# Patient Record
Sex: Male | Born: 1955 | Race: White | Hispanic: No | Marital: Married | Smoking: Former smoker
Health system: Southern US, Community
[De-identification: ages and names within clinical notes are randomized; demographics above are authoritative.]

## PROBLEM LIST (undated history)

## (undated) DIAGNOSIS — K219 Gastro-esophageal reflux disease without esophagitis: Secondary | ICD-10-CM

## (undated) DIAGNOSIS — Z86010 Personal history of colonic polyps: Secondary | ICD-10-CM

## (undated) DIAGNOSIS — I700 Atherosclerosis of aorta: Secondary | ICD-10-CM

## (undated) DIAGNOSIS — D649 Anemia, unspecified: Secondary | ICD-10-CM

## (undated) DIAGNOSIS — K4090 Unilateral inguinal hernia, without obstruction or gangrene, not specified as recurrent: Secondary | ICD-10-CM

## (undated) DIAGNOSIS — K2270 Barrett's esophagus without dysplasia: Secondary | ICD-10-CM

## (undated) DIAGNOSIS — M545 Low back pain, unspecified: Secondary | ICD-10-CM

## (undated) DIAGNOSIS — C801 Malignant (primary) neoplasm, unspecified: Secondary | ICD-10-CM

## (undated) DIAGNOSIS — K449 Diaphragmatic hernia without obstruction or gangrene: Secondary | ICD-10-CM

## (undated) DIAGNOSIS — K529 Noninfective gastroenteritis and colitis, unspecified: Secondary | ICD-10-CM

## (undated) DIAGNOSIS — D369 Benign neoplasm, unspecified site: Secondary | ICD-10-CM

## (undated) DIAGNOSIS — E785 Hyperlipidemia, unspecified: Secondary | ICD-10-CM

## (undated) DIAGNOSIS — IMO0002 Reserved for concepts with insufficient information to code with codable children: Secondary | ICD-10-CM

## (undated) DIAGNOSIS — K5790 Diverticulosis of intestine, part unspecified, without perforation or abscess without bleeding: Secondary | ICD-10-CM

## (undated) DIAGNOSIS — K648 Other hemorrhoids: Secondary | ICD-10-CM

## (undated) HISTORY — DX: Gastro-esophageal reflux disease without esophagitis: K21.9

## (undated) HISTORY — DX: Benign neoplasm, unspecified site: D36.9

## (undated) HISTORY — DX: Barrett's esophagus without dysplasia: K22.70

## (undated) HISTORY — DX: Atherosclerosis of aorta: I70.0

## (undated) HISTORY — DX: Low back pain, unspecified: M54.50

## (undated) HISTORY — DX: Diverticulosis of intestine, part unspecified, without perforation or abscess without bleeding: K57.90

## (undated) HISTORY — DX: Low back pain: M54.5

## (undated) HISTORY — DX: Reserved for concepts with insufficient information to code with codable children: IMO0002

## (undated) HISTORY — PX: COLONOSCOPY: SHX174

## (undated) HISTORY — DX: Noninfective gastroenteritis and colitis, unspecified: K52.9

## (undated) HISTORY — DX: Personal history of colonic polyps: Z86.010

## (undated) HISTORY — DX: Hyperlipidemia, unspecified: E78.5

## (undated) HISTORY — DX: Unilateral inguinal hernia, without obstruction or gangrene, not specified as recurrent: K40.90

## (undated) HISTORY — DX: Other hemorrhoids: K64.8

## (undated) HISTORY — DX: Anemia, unspecified: D64.9

## (undated) HISTORY — DX: Diaphragmatic hernia without obstruction or gangrene: K44.9

## (undated) HISTORY — PX: WISDOM TOOTH EXTRACTION: SHX21

---

## 1978-01-17 HISTORY — PX: HERNIA REPAIR: SHX51

## 2007-12-18 ENCOUNTER — Ambulatory Visit: Payer: Self-pay | Admitting: Internal Medicine

## 2008-01-25 ENCOUNTER — Ambulatory Visit: Payer: Self-pay | Admitting: Internal Medicine

## 2008-02-05 ENCOUNTER — Ambulatory Visit: Payer: Self-pay | Admitting: Internal Medicine

## 2008-05-08 ENCOUNTER — Ambulatory Visit: Payer: Self-pay | Admitting: Internal Medicine

## 2009-04-23 ENCOUNTER — Ambulatory Visit: Payer: Self-pay | Admitting: Internal Medicine

## 2009-06-30 ENCOUNTER — Ambulatory Visit: Payer: Self-pay | Admitting: Internal Medicine

## 2009-10-20 ENCOUNTER — Ambulatory Visit: Payer: Self-pay | Admitting: Internal Medicine

## 2010-03-11 DIAGNOSIS — J069 Acute upper respiratory infection, unspecified: Secondary | ICD-10-CM

## 2010-10-28 ENCOUNTER — Encounter: Payer: Self-pay | Admitting: Internal Medicine

## 2010-10-28 ENCOUNTER — Ambulatory Visit (INDEPENDENT_AMBULATORY_CARE_PROVIDER_SITE_OTHER): Payer: 59 | Admitting: Internal Medicine

## 2010-10-28 DIAGNOSIS — J069 Acute upper respiratory infection, unspecified: Secondary | ICD-10-CM

## 2010-10-28 DIAGNOSIS — Z87891 Personal history of nicotine dependence: Secondary | ICD-10-CM

## 2010-10-28 DIAGNOSIS — E785 Hyperlipidemia, unspecified: Secondary | ICD-10-CM

## 2010-10-28 DIAGNOSIS — M545 Low back pain, unspecified: Secondary | ICD-10-CM

## 2010-10-28 DIAGNOSIS — K219 Gastro-esophageal reflux disease without esophagitis: Secondary | ICD-10-CM

## 2010-11-14 ENCOUNTER — Encounter: Payer: Self-pay | Admitting: Internal Medicine

## 2010-11-14 DIAGNOSIS — Z87891 Personal history of nicotine dependence: Secondary | ICD-10-CM | POA: Insufficient documentation

## 2010-11-14 DIAGNOSIS — E785 Hyperlipidemia, unspecified: Secondary | ICD-10-CM | POA: Insufficient documentation

## 2010-11-14 DIAGNOSIS — M545 Low back pain, unspecified: Secondary | ICD-10-CM | POA: Insufficient documentation

## 2010-11-14 DIAGNOSIS — K219 Gastro-esophageal reflux disease without esophagitis: Secondary | ICD-10-CM | POA: Insufficient documentation

## 2010-11-14 NOTE — Patient Instructions (Signed)
Take Biaxin twice daily for 10 days with food as prescribed. Use Hycodan cough syrup as needed every 6 hours for cough. Call if not better in 10 days to 2 weeks or sooner if worse. Schedule physical exam in the near future.

## 2010-11-14 NOTE — Progress Notes (Signed)
  Subjective:    Patient ID: Sheral Flow, male    DOB: [birth year], 55 y.o.   MRN: [id number]  HPI 55 year old white male with history of hyperlipidemia, cigarette smoking, GE reflux, low back pain in today with URI symptoms. Discolored sputum production, malaise and fatigue. Smokes one half pack cigarettes per day and is not ready to quit.    Review of Systems     Objective:   Physical Exam HEENT exam: Slightly injected pharynx without exudate; TMs clear; neck supple without significant adenopathy; chest clear to auscultation        Assessment & Plan:  URI  Plan: Biaxin 500 mg by mouth twice daily for 10 days. Take with food. Hycodan cough syrup 8 ounces 1 teaspoon by mouth every 6 hours as needed for cough.

## 2011-01-18 DIAGNOSIS — Z860100 Personal history of colon polyps, unspecified: Secondary | ICD-10-CM

## 2011-01-18 DIAGNOSIS — K648 Other hemorrhoids: Secondary | ICD-10-CM

## 2011-01-18 DIAGNOSIS — K5790 Diverticulosis of intestine, part unspecified, without perforation or abscess without bleeding: Secondary | ICD-10-CM

## 2011-01-18 HISTORY — DX: Diverticulosis of intestine, part unspecified, without perforation or abscess without bleeding: K57.90

## 2011-01-18 HISTORY — DX: Personal history of colonic polyps: Z86.010

## 2011-01-18 HISTORY — DX: Other hemorrhoids: K64.8

## 2011-01-18 HISTORY — DX: Personal history of colon polyps, unspecified: Z86.0100

## 2011-02-22 ENCOUNTER — Other Ambulatory Visit: Payer: Self-pay

## 2011-02-22 MED ORDER — PANTOPRAZOLE SODIUM 40 MG PO TBEC: 40.0000 mg | DELAYED_RELEASE_TABLET | Freq: Every day | ORAL | Status: DC

## 2011-03-10 ENCOUNTER — Other Ambulatory Visit: Payer: 59 | Admitting: Internal Medicine

## 2011-03-11 ENCOUNTER — Encounter: Payer: 59 | Admitting: Internal Medicine

## 2011-04-22 ENCOUNTER — Other Ambulatory Visit: Payer: 59 | Admitting: Internal Medicine

## 2011-04-22 DIAGNOSIS — Z0000 Encounter for general adult medical examination without abnormal findings: Secondary | ICD-10-CM

## 2011-04-22 DIAGNOSIS — Z125 Encounter for screening for malignant neoplasm of prostate: Secondary | ICD-10-CM

## 2011-04-22 LAB — COMPREHENSIVE METABOLIC PANEL
ALT: 15 U/L (ref 0–53)
AST: 20 U/L (ref 0–37)
Albumin: 4.3 g/dL (ref 3.5–5.2)
Alkaline Phosphatase: 56 U/L (ref 39–117)
BUN: 12 mg/dL (ref 6–23)
CO2: 21 mEq/L (ref 19–32)
Calcium: 9.3 mg/dL (ref 8.4–10.5)
Chloride: 108 mEq/L (ref 96–112)
Creat: 0.88 mg/dL (ref 0.50–1.35)
Glucose, Bld: 94 mg/dL (ref 70–99)
Potassium: 4.4 mEq/L (ref 3.5–5.3)
Sodium: 140 mEq/L (ref 135–145)
Total Bilirubin: 0.4 mg/dL (ref 0.3–1.2)
Total Protein: 6.8 g/dL (ref 6.0–8.3)

## 2011-04-22 LAB — CBC WITH DIFFERENTIAL/PLATELET
Basophils Absolute: 0 10*3/uL (ref 0.0–0.1)
Basophils Relative: 0 % (ref 0–1)
Eosinophils Absolute: 0.2 10*3/uL (ref 0.0–0.7)
Eosinophils Relative: 3 % (ref 0–5)
HCT: 42.6 % (ref 39.0–52.0)
Hemoglobin: 13.8 g/dL (ref 13.0–17.0)
Lymphocytes Relative: 32 % (ref 12–46)
Lymphs Abs: 2.2 10*3/uL (ref 0.7–4.0)
MCH: 31.4 pg (ref 26.0–34.0)
MCHC: 32.4 g/dL (ref 30.0–36.0)
MCV: 97 fL (ref 78.0–100.0)
Monocytes Absolute: 0.6 10*3/uL (ref 0.1–1.0)
Monocytes Relative: 9 % (ref 3–12)
Neutro Abs: 3.8 10*3/uL (ref 1.7–7.7)
Neutrophils Relative %: 56 % (ref 43–77)
Platelets: 301 10*3/uL (ref 150–400)
RBC: 4.39 MIL/uL (ref 4.22–5.81)
RDW: 13.2 % (ref 11.5–15.5)
WBC: 6.8 10*3/uL (ref 4.0–10.5)

## 2011-04-22 LAB — LIPID PANEL
Cholesterol: 224 mg/dL — ABNORMAL HIGH (ref 0–200)
HDL: 66 mg/dL (ref >39)
LDL Cholesterol: 143 mg/dL — ABNORMAL HIGH (ref 0–99)
Total CHOL/HDL Ratio: 3.4 Ratio
Triglycerides: 74 mg/dL (ref <150)
VLDL: 15 mg/dL (ref 0–40)

## 2011-04-22 LAB — PSA: PSA: 1.01 ng/mL (ref <=4.00)

## 2011-04-25 ENCOUNTER — Encounter: Payer: 59 | Admitting: Internal Medicine

## 2011-05-12 ENCOUNTER — Other Ambulatory Visit: Payer: 59 | Admitting: Internal Medicine

## 2011-05-12 DIAGNOSIS — Z79899 Other long term (current) drug therapy: Secondary | ICD-10-CM

## 2011-05-12 DIAGNOSIS — E785 Hyperlipidemia, unspecified: Secondary | ICD-10-CM

## 2011-05-12 DIAGNOSIS — Z125 Encounter for screening for malignant neoplasm of prostate: Secondary | ICD-10-CM

## 2011-05-12 LAB — LIPID PANEL
Cholesterol: 252 mg/dL — ABNORMAL HIGH (ref 0–200)
HDL: 78 mg/dL (ref >39)
LDL Cholesterol: 155 mg/dL — ABNORMAL HIGH (ref 0–99)
Total CHOL/HDL Ratio: 3.2 Ratio
Triglycerides: 93 mg/dL (ref <150)
VLDL: 19 mg/dL (ref 0–40)

## 2011-05-12 LAB — CBC WITH DIFFERENTIAL/PLATELET
Basophils Absolute: 0 10*3/uL (ref 0.0–0.1)
Basophils Relative: 0 % (ref 0–1)
Eosinophils Absolute: 0.2 10*3/uL (ref 0.0–0.7)
Eosinophils Relative: 2 % (ref 0–5)
HCT: 44.3 % (ref 39.0–52.0)
Hemoglobin: 14.7 g/dL (ref 13.0–17.0)
Lymphocytes Relative: 27 % (ref 12–46)
Lymphs Abs: 2.4 10*3/uL (ref 0.7–4.0)
MCH: 32.2 pg (ref 26.0–34.0)
MCHC: 33.2 g/dL (ref 30.0–36.0)
MCV: 96.9 fL (ref 78.0–100.0)
Monocytes Absolute: 0.8 10*3/uL (ref 0.1–1.0)
Monocytes Relative: 9 % (ref 3–12)
Neutro Abs: 5.4 10*3/uL (ref 1.7–7.7)
Neutrophils Relative %: 62 % (ref 43–77)
Platelets: 320 10*3/uL (ref 150–400)
RBC: 4.57 MIL/uL (ref 4.22–5.81)
RDW: 12.7 % (ref 11.5–15.5)
WBC: 8.7 10*3/uL (ref 4.0–10.5)

## 2011-05-12 LAB — COMPREHENSIVE METABOLIC PANEL
ALT: 13 U/L (ref 0–53)
AST: 19 U/L (ref 0–37)
Albumin: 4.6 g/dL (ref 3.5–5.2)
Alkaline Phosphatase: 59 U/L (ref 39–117)
BUN: 12 mg/dL (ref 6–23)
CO2: 25 mEq/L (ref 19–32)
Calcium: 9.5 mg/dL (ref 8.4–10.5)
Chloride: 104 mEq/L (ref 96–112)
Creat: 0.86 mg/dL (ref 0.50–1.35)
Glucose, Bld: 99 mg/dL (ref 70–99)
Potassium: 4.5 mEq/L (ref 3.5–5.3)
Sodium: 139 mEq/L (ref 135–145)
Total Bilirubin: 0.5 mg/dL (ref 0.3–1.2)
Total Protein: 7.1 g/dL (ref 6.0–8.3)

## 2011-05-12 LAB — PSA: PSA: 1.11 ng/mL (ref <=4.00)

## 2011-05-13 ENCOUNTER — Encounter: Payer: Self-pay | Admitting: Internal Medicine

## 2011-05-13 ENCOUNTER — Ambulatory Visit (INDEPENDENT_AMBULATORY_CARE_PROVIDER_SITE_OTHER): Payer: 59 | Admitting: Internal Medicine

## 2011-05-13 VITALS — BP 136/80 | HR 80 | Resp 12 | Wt 213.0 lb

## 2011-05-13 DIAGNOSIS — Z0000 Encounter for general adult medical examination without abnormal findings: Secondary | ICD-10-CM

## 2011-05-13 DIAGNOSIS — Z23 Encounter for immunization: Secondary | ICD-10-CM

## 2011-05-13 DIAGNOSIS — E785 Hyperlipidemia, unspecified: Secondary | ICD-10-CM

## 2011-05-13 DIAGNOSIS — Z8739 Personal history of other diseases of the musculoskeletal system and connective tissue: Secondary | ICD-10-CM

## 2011-05-13 LAB — POCT URINALYSIS DIPSTICK
Bilirubin, UA: NEGATIVE
Glucose, UA: NEGATIVE
Ketones, UA: NEGATIVE
Leukocytes, UA: NEGATIVE
Nitrite, UA: NEGATIVE
Protein, UA: NEGATIVE
Spec Grav, UA: <=1.005
Urobilinogen, UA: NEGATIVE
pH, UA: 6

## 2011-05-13 NOTE — Progress Notes (Signed)
  Subjective:    Patient ID: James [name], male    DOB: [birth year], 56 y.o.   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 History of GE reflux. History of hyperlipidemia. History of cigarette smoking. Is never had screening colonoscopy but agrees to do this in the near future. Recent lipid panel shows a total cholesterol of 252 with an LDL cholesterol of 155.  No known drug allergies  History of degenerative disc disease in his back with episodic low back pain.  Fractured left forearm 1962, fractured right ankle 1971, left inguinal herniorrhaphy 1995. Tetanus immunization update given today.  Social history married completed 2 years of college. Has smoked a pack of cigarettes daily for well over 25 years.  Family history: Father died at age 94 with history of coronary artery disease esophageal and colon cancer. Mother died from complications of a stroke. 2 sisters in good health.    Review of Systems  Constitutional: Negative.   HENT:       History of allergic rhinitis  Eyes: Negative.   Respiratory: Negative.   Cardiovascular: Negative.   Gastrointestinal: Negative.   Genitourinary: Negative.   Musculoskeletal:       History of intermittent low back pain  Neurological: Negative.   Hematological: Negative.   Psychiatric/Behavioral: Negative.        Objective:   Physical Exam  Vitals reviewed. Constitutional: He is oriented to person, place, and time. He appears well-developed and well-nourished. No distress.  HENT:  Head: Normocephalic and atraumatic.  Right Ear: External ear normal.  Left Ear: External ear normal.  Mouth/Throat: Oropharynx is clear and moist.  Eyes: Conjunctivae and EOM are normal. Pupils are equal, round, and reactive to light. Right eye exhibits no discharge. Left eye exhibits no discharge. No scleral icterus.  Neck: Neck supple. No JVD present. No  thyromegaly present.  Cardiovascular: Normal rate, regular rhythm, normal heart sounds and intact distal pulses.   No murmur heard. Pulmonary/Chest: Effort normal and breath sounds normal. No respiratory distress. He has no wheezes. He has no rales.  Abdominal: Soft. He exhibits no distension and no mass. There is no tenderness. There is no rebound and no guarding.  Genitourinary: Prostate normal.  Musculoskeletal: He exhibits no edema.  Lymphadenopathy:    He has no cervical adenopathy.  Neurological: He is alert and oriented to person, place, and time. He has normal reflexes. No cranial nerve deficit. Coordination normal.  Skin: Skin is warm and dry. No rash noted. He is not diaphoretic.  Psychiatric: He has a normal mood and affect. His behavior is normal. Judgment and thought content normal.          Assessment & Plan:  History of smoking  GE reflux  Family history of colon cancer in his father  Low back pain  Hyperlipidemia  Plan: Convince patient to start Zocor 10 mg daily. He will return in 3 months for fasting lipid panel liver functions and office visit. Tetanus immunization update given today. Colonoscopy scheduled for May 30 with Dr. Hilarie Fredrickson.

## 2011-05-13 NOTE — Patient Instructions (Signed)
Start Zocor 10 mg daily at supper and return in 3 months for fasting lipid panel liver functions and office visit. Try the diet exercise and lose some weight. We have scheduled colonoscopy appointment for you. You have been given a tetanus immunization update today which is good for 10 years.

## 2011-05-20 ENCOUNTER — Encounter: Payer: Self-pay | Admitting: Internal Medicine

## 2011-06-02 ENCOUNTER — Encounter: Payer: Self-pay | Admitting: Internal Medicine

## 2011-06-02 ENCOUNTER — Ambulatory Visit (AMBULATORY_SURGERY_CENTER): Payer: 59 | Admitting: *Deleted

## 2011-06-02 VITALS — Ht 72.0 in | Wt 215.7 lb

## 2011-06-02 DIAGNOSIS — Z1211 Encounter for screening for malignant neoplasm of colon: Secondary | ICD-10-CM

## 2011-06-02 MED ORDER — NA SULFATE-K SULFATE-MG SULF 17.5-3.13-1.6 GM/177ML PO SOLN: ORAL | Status: DC

## 2011-06-16 ENCOUNTER — Ambulatory Visit (AMBULATORY_SURGERY_CENTER): Payer: 59 | Admitting: Internal Medicine

## 2011-06-16 ENCOUNTER — Encounter: Payer: Self-pay | Admitting: Internal Medicine

## 2011-06-16 VITALS — BP 138/91 | HR 75 | Temp 96.9°F | Resp 12 | Ht 72.0 in | Wt 215.0 lb

## 2011-06-16 DIAGNOSIS — D126 Benign neoplasm of colon, unspecified: Secondary | ICD-10-CM

## 2011-06-16 DIAGNOSIS — Z1211 Encounter for screening for malignant neoplasm of colon: Secondary | ICD-10-CM

## 2011-06-16 DIAGNOSIS — K529 Noninfective gastroenteritis and colitis, unspecified: Secondary | ICD-10-CM

## 2011-06-16 DIAGNOSIS — D214 Benign neoplasm of connective and other soft tissue of abdomen: Secondary | ICD-10-CM

## 2011-06-16 DIAGNOSIS — K5289 Other specified noninfective gastroenteritis and colitis: Secondary | ICD-10-CM

## 2011-06-16 MED ORDER — HYDROCORTISONE ACE-PRAMOXINE 1-1 % RE FOAM: 1.0000 | Freq: Two times a day (BID) | RECTAL | Status: DC

## 2011-06-16 MED ORDER — SODIUM CHLORIDE 0.9 % IV SOLN: 500.0000 mL | INTRAVENOUS | Status: DC

## 2011-06-16 NOTE — Patient Instructions (Signed)
Discharge instructions given with verbal understanding. Handouts on polyps,diverticulosis and hemorrhoids given. Resume previous medications. Avoid all NSAIDS. YOU HAD AN ENDOSCOPIC PROCEDURE TODAY AT [facility]: Refer to the procedure report that was given to you for any specific questions about what was found during the examination.  If the procedure report does not answer your questions, please call your gastroenterologist to clarify.  If you requested that your care partner not be given the details of your procedure findings, then the procedure report has been included in a sealed envelope for you to review at your convenience later.  YOU SHOULD EXPECT: Some feelings of bloating in the abdomen. Passage of more gas than usual.  Walking can help get rid of the air that was put into your GI tract during the procedure and reduce the bloating. If you had a lower endoscopy (such as a colonoscopy or flexible sigmoidoscopy) you may notice spotting of blood in your stool or on the toilet paper. If you underwent a bowel prep for your procedure, then you may not have a normal bowel movement for a few days.  DIET: Your first meal following the procedure should be a light meal and then it is ok to progress to your normal diet.  A half-sandwich or bowl of soup is an example of a good first meal.  Heavy or fried foods are harder to digest and may make you feel nauseous or bloated.  Likewise meals heavy in dairy and vegetables can cause extra gas to form and this can also increase the bloating.  Drink plenty of fluids but you should avoid alcoholic beverages for 24 hours.  ACTIVITY: Your care partner should take you home directly after the procedure.  You should plan to take it easy, moving slowly for the rest of the day.  You can resume normal activity the day after the procedure however you should NOT DRIVE or use heavy machinery for 24 hours (because of the sedation medicines used during the  test).    SYMPTOMS TO REPORT IMMEDIATELY: A gastroenterologist can be reached at any hour.  During normal business hours, 8:30 AM to 5:00 PM Monday through Friday, call [phone].  After hours and on weekends, please call the GI answering service at [phone] who will take a message and have the physician on call contact you.   Following lower endoscopy (colonoscopy or flexible sigmoidoscopy):  Excessive amounts of blood in the stool  Significant tenderness or worsening of abdominal pains  Swelling of the abdomen that is new, acute  Fever of 100F or higher  FOLLOW UP: If any biopsies were taken you will be contacted by phone or by letter within the next 1-3 weeks.  Call your gastroenterologist if you have not heard about the biopsies in 3 weeks.  Our staff will call the home number listed on your records the next business day following your procedure to check on you and address any questions or concerns that you may have at that time regarding the information given to you following your procedure. This is a courtesy call and so if there is no answer at the home number and we have not heard from you through the emergency physician on call, we will assume that you have returned to your regular daily activities without incident.  SIGNATURES/CONFIDENTIALITY: You and/or your care partner have signed paperwork which will be entered into your electronic medical record.  These signatures attest to the fact that that the information above on  your After Visit Summary has been reviewed and is understood.  Full responsibility of the confidentiality of this discharge information lies with you and/or your care-partner.

## 2011-06-16 NOTE — Progress Notes (Signed)
The pt tolerated the colonoscopy very well. Maw   

## 2011-06-16 NOTE — Op Note (Signed)
[facility] [address]. [place], [place]  [address]  COLONOSCOPY PROCEDURE REPORT  PATIENT:  James, [name]  MR#:  [id number] BIRTHDATE:  07-16-1955, 56 yrs. old  GENDER:  male ENDOSCOPIST:  Lajuan Lines. Cortlandt Capuano, MD REF. BY:  Emeline General, M.D. PROCEDURE DATE:  06/16/2011 PROCEDURE:  Colonoscopy with multiple cold biopsies, Colonoscopy with snare polypectomy ASA CLASS:  Class II INDICATIONS:  Routine Risk Screening, 1st colonoscopy, intermittent BRBPR MEDICATIONS:   MAC sedation, administered by CRNA, propofol (Diprivan) 600 mg IV  DESCRIPTION OF PROCEDURE:   After the risks benefits and alternatives of the procedure were thoroughly explained, informed consent was obtained.  Digital rectal exam was performed and revealed no rectal masses.   The LB CF-[id number] endoscope was introduced through the anus and advanced to the cecum, which was identified by both the appendix and ileocecal valve, without limitations.  The quality of the prep was Suprep good.  The instrument was then slowly withdrawn as the colon was fully examined.<<PROCEDUREIMAGES>>  FINDINGS:  A 7 mm sessile polyp and firm was found in the cecum. Polyp was snared, then cauterized with monopolar cautery. Retrieval was successful. Two sessile polyps, 4 -5 mm, were found in the transverse colon. Polyps were snared without cautery. Retrieval was successful.  Segmental colitis was found in the sigmoid colon characterized by erythema, loss of normal vascular pattern and scant blood in the left colon. Multiple biopsies were obtained and sent to pathology.  A 2 cm pedunculated polyp was found in the sigmoid colon arising in the segment of colitis. Polyp was snared, then cauterized with monopolar cautery. Retrieval was successful.  Tattoo placed at this location with Niger ink.  There were 6 polyps identified and removed in the recto-sigmoid colon. Three Polyps were snared, then cauterized with monopolar  cautery. Retrieval was successful. Three polyps were removed using cold biopsy forceps.   Sigmoid diverticulosis. Retroflexed views in the rectum revealed internal hemorrhoids. The scope was then withdrawn from the cecum and the procedure completed.  COMPLICATIONS:  None  ENDOSCOPIC IMPRESSION: 1) Sessile polyp in the cecum. Removed and sent to pathology. 2) Two polyps in the transverse colon.  Removed and sent to pathology. 3) Colitis in the sigmoid colon.  Multiple biopsies. 4) Pedunculated polyp in the sigmoid colon.  Removed and sent to pathology.  Tattoo placed. 5) Polyps, six,  in the recto-sigmoid colon.  Removed and sent to pathology. 6) Sigmoid diverticulosis. 7) Moderate-sized internal hemorrhoids  RECOMMENDATIONS: 1) Avoid all NSAIDs. 2) Await pathology results 3) High fiber diet. 4) Timing of repeat colonoscopy will be determined by pathology findings. 5) You will receive a letter within 1-2 weeks with the results of your biopsy as well as final recommendations. Please call my office if you have not received a letter after 3 weeks. 6) Proctofoam twice daily for 10 days for hemorrhoids.  Lajuan Lines. Hilarie Fredrickson, MD  CC:  Emeline General, MD The Patient  n. eSIGNED:   Lajuan Lines. Dallys Nowakowski at 06/16/2011 12:42 PM  Sheral Flow, [phone]

## 2011-06-16 NOTE — Progress Notes (Signed)
Hung 2nd bag of normal saline at 12:10 0.9% 500 ml by Levin Erp, CRNA. Maw

## 2011-06-17 ENCOUNTER — Telehealth: Payer: Self-pay | Admitting: *Deleted

## 2011-06-17 NOTE — Telephone Encounter (Signed)
Pt states that he is having abdominal pains and some slight nausea.  He just ate Qdoba and had some grilled vegetables. He states that he does feel like it's cramping like gas.  Explained to the patient that it is probably air and that he should try not to eat anything else gassy and to drink warm liquids.  Encouraged pt to call numbers on the d/c instructions if pain or nausea worsens over the night or weekend.

## 2011-06-17 NOTE — Progress Notes (Signed)
Addended by: Lowry Ram on: 06/17/2011 07:30 AM   Modules accepted: Level of Service

## 2011-06-17 NOTE — Telephone Encounter (Signed)
  Follow up Call-  Call back number 06/16/2011  Post procedure Call Back phone  # [phone]  Permission to leave phone message Yes     Patient questions:  Do you have a fever, pain , or abdominal swelling? no Pain Score  0 *  Have you tolerated food without any problems? yes  Have you been able to return to your normal activities? yes  Do you have any questions about your discharge instructions: Diet   no Medications  no Follow up visit  no  Do you have questions or concerns about your Care? no  Actions: * If pain score is 4 or above: No action needed, pain <4.

## 2011-06-30 ENCOUNTER — Encounter: Payer: Self-pay | Admitting: Internal Medicine

## 2011-06-30 ENCOUNTER — Encounter: Payer: Self-pay | Admitting: *Deleted

## 2011-07-18 ENCOUNTER — Ambulatory Visit: Payer: 59 | Admitting: Internal Medicine

## 2011-08-05 ENCOUNTER — Other Ambulatory Visit: Payer: Self-pay

## 2011-08-05 MED ORDER — PANTOPRAZOLE SODIUM 40 MG PO TBEC: 40.0000 mg | DELAYED_RELEASE_TABLET | Freq: Every day | ORAL | Status: DC

## 2011-08-11 ENCOUNTER — Other Ambulatory Visit: Payer: 59 | Admitting: Internal Medicine

## 2011-08-12 ENCOUNTER — Ambulatory Visit: Payer: 59 | Admitting: Internal Medicine

## 2011-08-18 ENCOUNTER — Encounter: Payer: Self-pay | Admitting: Internal Medicine

## 2011-08-19 ENCOUNTER — Encounter: Payer: Self-pay | Admitting: Internal Medicine

## 2011-08-19 ENCOUNTER — Ambulatory Visit (INDEPENDENT_AMBULATORY_CARE_PROVIDER_SITE_OTHER): Payer: 59 | Admitting: Internal Medicine

## 2011-08-19 VITALS — BP 130/80 | HR 80 | Ht 72.0 in | Wt 213.8 lb

## 2011-08-19 DIAGNOSIS — K5010 Crohn's disease of large intestine without complications: Secondary | ICD-10-CM

## 2011-08-19 DIAGNOSIS — K5150 Left sided colitis without complications: Secondary | ICD-10-CM | POA: Insufficient documentation

## 2011-08-19 DIAGNOSIS — Z86010 Personal history of colonic polyps: Secondary | ICD-10-CM

## 2011-08-19 NOTE — Progress Notes (Signed)
Patient ID: James [name], male   DOB: Sep 18, 1955, 56 y.o.   MRN: [id number]  SUBJECTIVE: HPI James [name] is a 56 yo male with PMH of GERD, hiatus hernia, degenerative disc disease, hyperlipidemia, and adenomatous colon polyps who is seen in followup. He is known to me after being referred for screening colonoscopy by Dr. Renold Genta.  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  Today he reports he is doing well. He is without GI complaints. He's having no abdominal pain. Appetite is good. No nausea or vomiting. Bowel habits are regular for him without diarrhea or constipation. He's had no rectal bleeding, though the past he has noted bright red blood which he has attributed to hemorrhoids. No melena. No fevers or chills. Weight is stable.  He does notice family history of colon cancer in both his father and mother. He states his father was diagnosed with colon andesophagus cancer around age 18. His mother's death certificate lists colon cancer. She died at age 76  Review of Systems  As per history of present illness, otherwise negative   Past Medical History  Diagnosis Date  . Hiatal hernia   . GERD (gastroesophageal reflux disease)   . Degenerative disc disease   . Hyperlipidemia   . Lower back pain   . Internal hemorrhoids 2013    Colonoscopy  . Diverticulosis 2013    Colonoscopy  . Hx of colonic polyps 2013    Colonoscopy- tubular adenoma, and hyperplastic      Current Outpatient Prescriptions  Medication Sig Dispense Refill  . pantoprazole (PROTONIX) 40 MG tablet Take 1 tablet (40 mg total) by mouth daily.  30 tablet  5    No Known Allergies  Family History  Problem Relation Age of Onset  . Heart disease Father   . Colon cancer Father 51  . Esophageal cancer Father     late 61's  . Asthma Daughter   . Colon cancer Mother   . Stomach cancer Neg Hx     History  Substance Use Topics  . Smoking status: Current Everyday Smoker -- 0.2 packs/day for 30 years    Types: Cigarettes  . Smokeless tobacco: Never Used   Comment: Counseling sheet given 08-2011  . Alcohol Use: 1.0 oz/week    2 drink(s) per week    OBJECTIVE: BP 130/80  Pulse 80  Ht 6' (1.829 m)  Wt 213 lb 12.8 oz (96.979 kg)  BMI 29.00 kg/m2 Constitutional: Well-developed and well-nourished. No distress. HEENT: Normocephalic and atraumatic. Oropharynx is clear and moist. No oropharyngeal exudate. Conjunctivae are normal. Pupils are equal round and reactive to light. No scleral icterus. Neck: Neck supple. Trachea midline. Cardiovascular: Normal rate, regular rhythm and intact distal pulses. No M/R/G Pulmonary/chest: Effort normal and breath sounds normal. No wheezing, rales or rhonchi. Abdominal: Soft, nontender, nondistended. Bowel sounds active throughout. There are no masses palpable. No hepatosplenomegaly. Extremities: no clubbing, cyanosis, or edema Lymphadenopathy: No cervical adenopathy noted. Neurological: Alert and oriented to person place and time. Skin: Skin is warm and dry. No rashes noted. Psychiatric: Normal mood and affect. Behavior is normal.  ASSESSMENT AND PLAN: James [name] is a 56 yo male with PMH of GERD, hiatus hernia, degenerative disc disease, hyperlipidemia, and adenomatous colon polyps who is seen in followup.  1. Hx of adenomatous colon polyp and segmental colitis -- the patient is not having any signs or symptoms attributable to colitis at  this time. The segmental colitis was acute only and perhaps was diverticular associated.  Based on guidelines in the number of polyps that he had removed, I had initially recommended a 3 year surveillance colonoscopy. However, upon further consideration given that the large sigmoid polyp arose in the setting of colitis, I recommended repeat examination at the six-month mark to ensure resolution of colitis and that the polyp was entirely resected. Tattoo was placed around the sigmoid colon polyp and should make this area fairly easy to reexamine. I discussed this recommendation with him and he is agreeable to proceed. This test will be scheduled in or around November 2013. I've asked that he call us should he develop concerning abdominal symptoms or bleeding prior to this. He voices understanding.

## 2011-08-19 NOTE — Patient Instructions (Addendum)
Dr. Hilarie Fredrickson recommends a repeat colonoscopy in October or November of this year. Please call our office in September to schedule with Dr. Hilarie Fredrickson.

## 2011-11-10 ENCOUNTER — Encounter: Payer: Self-pay | Admitting: Internal Medicine

## 2012-02-23 ENCOUNTER — Other Ambulatory Visit: Payer: Self-pay

## 2012-02-23 MED ORDER — PANTOPRAZOLE SODIUM 40 MG PO TBEC: 40.0000 mg | DELAYED_RELEASE_TABLET | Freq: Every day | ORAL | Status: DC

## 2012-03-19 ENCOUNTER — Encounter: Payer: Self-pay | Admitting: Internal Medicine

## 2012-03-19 ENCOUNTER — Ambulatory Visit (INDEPENDENT_AMBULATORY_CARE_PROVIDER_SITE_OTHER): Payer: 59 | Admitting: Internal Medicine

## 2012-03-19 VITALS — BP 126/80 | HR 80 | Temp 97.7°F | Wt 215.0 lb

## 2012-03-19 DIAGNOSIS — J069 Acute upper respiratory infection, unspecified: Secondary | ICD-10-CM

## 2012-03-19 DIAGNOSIS — H6591 Unspecified nonsuppurative otitis media, right ear: Secondary | ICD-10-CM

## 2012-03-19 DIAGNOSIS — H6590 Unspecified nonsuppurative otitis media, unspecified ear: Secondary | ICD-10-CM

## 2012-03-19 DIAGNOSIS — J029 Acute pharyngitis, unspecified: Secondary | ICD-10-CM

## 2012-03-19 NOTE — Progress Notes (Signed)
  Subjective:    Patient ID: James [name], male    DOB: 1955/08/05, 57 y.o.   MRN: [id number]  HPI Approximate one-week history of URI symptoms. Has cough with discolored sputum production and complained of right ear discomfort. Some sore throat. No fever or shaking chills. Has malaise and fatigue.    Review of Systems     Objective:   Physical Exam HEENT exam: Right TM is full but not red. Left TM is normal. Pharynx is injected without exudate. He has anterior cervical nodes bilaterally that are slightly tender. Neck is supple. Chest clear to auscultation.        Assessment & Plan:  Right serous otitis media  Pharyngitis  URI  Plan: Biaxin 500 mg by mouth twice daily for 10 days. Hycodan( 8 ounces) 1 teaspoon by mouth every 8 hours when necessary cough with no refill.

## 2012-03-19 NOTE — Patient Instructions (Addendum)
Take Biaxin twice daily for 10 days with food. Take Hycodan 8 ounces every 8 hours as needed for cough. Call if not better in one week or sooner if worse

## 2012-09-20 ENCOUNTER — Encounter: Payer: Self-pay | Admitting: Internal Medicine

## 2012-09-20 ENCOUNTER — Ambulatory Visit (INDEPENDENT_AMBULATORY_CARE_PROVIDER_SITE_OTHER): Payer: 59 | Admitting: Internal Medicine

## 2012-09-20 VITALS — BP 128/74 | HR 68 | Temp 98.1°F | Wt 213.0 lb

## 2012-09-20 DIAGNOSIS — B353 Tinea pedis: Secondary | ICD-10-CM

## 2012-09-20 DIAGNOSIS — Z23 Encounter for immunization: Secondary | ICD-10-CM

## 2012-09-20 MED ORDER — ECONAZOLE NITRATE 1 % EX CREA: TOPICAL_CREAM | Freq: Every day | CUTANEOUS | Status: DC

## 2012-09-20 NOTE — Progress Notes (Signed)
  Subjective:    Patient ID: Arita Miss, male    DOB: [birth year], 57 y.o.   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 He's been using over-the-counter athlete's foot medication for while but not recently. Has erythema and crusting between fourth and fifth toes bilaterally. Getting ready to go to [place] for 25th anniversary. Is worried about excessive sweating. Advised him to stay hydrated. Says he picks at his toes at night. Some itching.    Review of Systems     Objective:   Physical Exam erythema between fourth and fifth toes bilaterally with crusting.        Assessment & Plan:  Tinea pedis  Plan: Spectazole cream to use daily with refills. Talk with patient about foot hygiene. Return when necessary.  Influenza immunization given today

## 2012-09-20 NOTE — Patient Instructions (Addendum)
Use Spectazole cream between toes nightly until resolved. Influenza immunization given today.

## 2013-01-17 DIAGNOSIS — G4730 Sleep apnea, unspecified: Secondary | ICD-10-CM

## 2013-01-17 HISTORY — DX: Sleep apnea, unspecified: G47.30

## 2013-02-27 ENCOUNTER — Other Ambulatory Visit: Payer: Self-pay | Admitting: Internal Medicine

## 2013-04-01 ENCOUNTER — Other Ambulatory Visit: Payer: Self-pay | Admitting: Internal Medicine

## 2013-10-21 ENCOUNTER — Telehealth: Payer: Self-pay | Admitting: Internal Medicine

## 2013-10-21 MED ORDER — ECONAZOLE NITRATE 1 % EX CREA: TOPICAL_CREAM | Freq: Every day | CUTANEOUS | Status: DC

## 2013-10-21 NOTE — Telephone Encounter (Signed)
Econazole cream escribed to [facility].  Patient aware.

## 2013-10-21 NOTE — Telephone Encounter (Signed)
Refill x one year.

## 2013-10-21 NOTE — Telephone Encounter (Signed)
Wife wants a refill on Econazole Nitrate 1% cream.  States refills expired on 09/19/13.   Pharmacy:  [facility] @ [place].

## 2013-12-03 ENCOUNTER — Ambulatory Visit (INDEPENDENT_AMBULATORY_CARE_PROVIDER_SITE_OTHER): Payer: 59 | Admitting: Internal Medicine

## 2013-12-03 ENCOUNTER — Encounter: Payer: Self-pay | Admitting: Internal Medicine

## 2013-12-03 VITALS — BP 140/98 | HR 71 | Temp 97.7°F | Ht 72.0 in | Wt 212.0 lb

## 2013-12-03 DIAGNOSIS — Z23 Encounter for immunization: Secondary | ICD-10-CM

## 2013-12-03 DIAGNOSIS — J069 Acute upper respiratory infection, unspecified: Secondary | ICD-10-CM

## 2013-12-03 MED ORDER — CLARITHROMYCIN 500 MG PO TABS: 500.0000 mg | ORAL_TABLET | Freq: Two times a day (BID) | ORAL | Status: DC

## 2013-12-03 MED ORDER — HYDROCODONE-HOMATROPINE 5-1.5 MG/5ML PO SYRP
5.0000 mL | ORAL_SOLUTION | Freq: Three times a day (TID) | ORAL | Status: DC | PRN
Start: 2013-12-03 — End: 2014-03-04

## 2013-12-03 NOTE — Patient Instructions (Signed)
Take Biaxin 500 mg twice as directed for 10 days. Take Hycodan as directed.

## 2013-12-03 NOTE — Progress Notes (Signed)
   Subjective:    Patient ID: James [name], male    DOB: Oct 16, 1955, 58 y.o.   MRN: [id number]  HPI  Going to [place] in 2 days for vacation. His come down with upper respiratory infection. Has had some cough. No fever or shaking chills.    Review of Systems     Objective:   Physical Exam Pharynx is clear. TMs are clear. Has anterior cervical nodes bilaterally that are tender. Neck is supple. Chest clear.       Assessment & Plan:  Acute URI  Plan: Biaxin 500 mg twice daily for 10 days with one refill. Hycodan 1 teaspoon by mouth every 8 hours when necessary cough.

## 2013-12-09 ENCOUNTER — Telehealth: Payer: Self-pay | Admitting: Internal Medicine

## 2013-12-09 NOTE — Telephone Encounter (Signed)
Patient and his wife are in [place] on vacation. Wife called saying patient was having considerable issues with hip pain. Apparently this is not a new issue for him. He is having issues walking because of pain. She has a prednisone dosepak and have advised her to have him start that. When he returns he will need to see orthopedist for evaluation. She thinks it's hip pain and not back pain. Do not take ibuprofen or aspirin while taking prednisone.

## 2013-12-19 ENCOUNTER — Telehealth: Payer: Self-pay | Admitting: Internal Medicine

## 2013-12-19 NOTE — Telephone Encounter (Signed)
Wife is calling; states that when you spoke with James [name] while they were in [place] you advised that he would need an MRI of his knee.  Wants to know how you want to proceed with that?  Do you want to see him first?  Or, are we just setting that up for him?

## 2013-12-20 NOTE — Telephone Encounter (Signed)
I understood it was his hip. He should see Dr. Rush Farmer at [facility] [phone].

## 2013-12-23 ENCOUNTER — Telehealth: Payer: Self-pay | Admitting: Internal Medicine

## 2013-12-23 NOTE — Telephone Encounter (Signed)
Spoke with wife today and advised that patient should be see by [facility].  Provided phone # and she'll call there to get an appointment.

## 2014-02-21 ENCOUNTER — Other Ambulatory Visit (HOSPITAL_COMMUNITY): Payer: Self-pay | Admitting: Orthopaedic Surgery

## 2014-03-04 ENCOUNTER — Ambulatory Visit (INDEPENDENT_AMBULATORY_CARE_PROVIDER_SITE_OTHER): Payer: 59 | Admitting: Internal Medicine

## 2014-03-04 ENCOUNTER — Encounter: Payer: Self-pay | Admitting: Internal Medicine

## 2014-03-04 VITALS — BP 118/76 | HR 78 | Temp 97.8°F | Wt 219.0 lb

## 2014-03-04 DIAGNOSIS — Z0000 Encounter for general adult medical examination without abnormal findings: Secondary | ICD-10-CM

## 2014-03-04 DIAGNOSIS — E785 Hyperlipidemia, unspecified: Secondary | ICD-10-CM

## 2014-03-04 DIAGNOSIS — Z720 Tobacco use: Secondary | ICD-10-CM

## 2014-03-04 DIAGNOSIS — M1611 Unilateral primary osteoarthritis, right hip: Secondary | ICD-10-CM

## 2014-03-04 DIAGNOSIS — Z8719 Personal history of other diseases of the digestive system: Secondary | ICD-10-CM

## 2014-03-04 DIAGNOSIS — Z87891 Personal history of nicotine dependence: Secondary | ICD-10-CM

## 2014-03-04 DIAGNOSIS — Z86010 Personal history of colonic polyps: Secondary | ICD-10-CM

## 2014-03-04 DIAGNOSIS — Z8739 Personal history of other diseases of the musculoskeletal system and connective tissue: Secondary | ICD-10-CM

## 2014-03-04 DIAGNOSIS — J309 Allergic rhinitis, unspecified: Secondary | ICD-10-CM | POA: Insufficient documentation

## 2014-03-04 DIAGNOSIS — Z800 Family history of malignant neoplasm of digestive organs: Secondary | ICD-10-CM

## 2014-03-04 NOTE — Addendum Note (Signed)
Addended by: Elby Showers on: 03/04/2014 02:42 PM   Modules accepted: Level of Service

## 2014-03-04 NOTE — Patient Instructions (Addendum)
Patient cleared for hip replacement surgery. Is to have fasting lab work in the near future. Needs to quit smoking.

## 2014-03-04 NOTE — Progress Notes (Signed)
Subjective:    Patient ID: James [name], male    DOB: January 06, 1956, 59 y.o.   MRN: [id number]  HPI  59 year old White male in today to discuss hip replacement surgery. He has seen Dr. Ninfa Linden regarding right hip pain which has been off and on for several years. Recent x-rays done by Dr. Ninfa Linden showed severe osteoarthritis in right hip. He had almost complete loss of joint space and even some cystic changes. There were periarticular osteophytes as well. He is scheduled for anterior hip replacement within the next 2 weeks.  No known drug allergies  History of degenerative disc disease and back with episodic low back pain.  History of GE reflux and hyperlipidemia. History of cigarette smoking.  In 2013 he had screening colonoscopy. He had 2 serrated adenomas, several hyperplastic polyps and a submucosal granular tumor and some segmental sigmoid colitis. Polyps were not high-grade.  Fractured left forearm 1962, fractured right ankle 1971, left inguinal herniorrhaphy 1995. Tetanus immunization up-to-date given in April 2013.  Family history: Father died at age 22 with history of coronary artery disease, esophageal and colon cancer. Mother deceased with history of stroke and history of colon cancer. 2 sisters in good health.  Social history: Married, completed 2 years of college. 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 Has smoked a pack of cigarettes daily for over 25 years. Currently smoking about a half pack daily.       Review of Systems  Constitutional: Negative.   HENT: Negative.   Respiratory: Negative.   Cardiovascular: Negative.   Gastrointestinal:       GE reflux  Musculoskeletal:       Chronic right hip pain. Beginning to have some bilateral knee pain. Intermittent low back pain.  Allergic/Immunologic: Positive for environmental allergies.  Hematological: Negative.   Psychiatric/Behavioral: Negative.        Objective:   Physical Exam  Constitutional: He  is oriented to person, place, and time. He appears well-developed and well-nourished. No distress.  HENT:  Head: Normocephalic and atraumatic.  Left Ear: External ear normal.  Mouth/Throat: No oropharyngeal exudate.  Eyes: Conjunctivae are normal. Right eye exhibits no discharge. No scleral icterus.  Neck: Neck supple. No JVD present. No thyromegaly present.  Cardiovascular: Normal rate, regular rhythm, normal heart sounds and intact distal pulses.   No murmur heard. Pulmonary/Chest: Effort normal and breath sounds normal. No respiratory distress. He has no wheezes. He has no rales.  Abdominal: Bowel sounds are normal. He exhibits no distension and no mass. There is no tenderness. There is no rebound and no guarding.  Genitourinary: Rectum normal.  Prostate normal without nodules  Musculoskeletal: He exhibits no edema.  Lymphadenopathy:    He has no cervical adenopathy.  Neurological: He is alert and oriented to person, place, and time. He has normal reflexes. He displays normal reflexes. No cranial nerve deficit. Coordination normal.  Skin: Skin is warm and dry. No rash noted. He is not diaphoretic.  Psychiatric: He has a normal mood and affect. His behavior is normal. Judgment and thought content normal.  Vitals reviewed.         Assessment & Plan:  Normal health maintenance exam  Right hip osteoarthritis-to have anterior hip replacement by Dr. Ninfa Linden in the near future  History of intermittent low back pain  History of allergic rhinitis  Hyperlipidemia  History of GE reflux  History of smoking  History of adenomatous colon polyps status post colonoscopy 2013  Plan: EKG performed today  is within normal limits. Is to have fasting lab work in the near future.

## 2014-03-04 NOTE — Patient Instructions (Addendum)
Your procedure is scheduled on:  03/14/14  FRIDAY  Report to [facility] at  1000     AM.   Call this number if you have problems the morning of surgery: [phone]        Do not eat food  Or drink :After Midnight. Thursday NIGHT   Take these medicines the morning of surgery with A SIP OF WATER: PROTONIX  .  Contacts, dentures or partial plates, or metal hairpins  can not be worn to surgery. Your family will be responsible for glasses, dentures, hearing aides while you are in surgery  Leave suitcase in the car. After surgery it may be brought to your room.  For patients admitted to the hospital, checkout time is 11:00 AM day of  discharge.         [facility] IS NOT RESPONSIBLE FOR ANY VALUABLES  Patients discharged the day of surgery will not be allowed to drive home. IF going home the day of surgery, you must have a driver and someone to stay with you for the first 24 hours                                                                                                                                          [facility] - Preparing for Surgery Before surgery, you can play an important role.  Because skin is not sterile, your skin needs to be as free of germs as possible.  You can reduce the number of germs on your skin by washing with CHG (chlorahexidine gluconate) soap before surgery.  CHG is an antiseptic cleaner which kills germs and bonds with the skin to continue killing germs even after washing. Please DO NOT use if you have an allergy to CHG or antibacterial soaps.  If your skin becomes reddened/irritated stop using the CHG and inform your nurse when you arrive at Short Stay. Do not shave (including legs and underarms) for at least 48 hours prior to the first CHG shower.  You may shave your face/neck. Please follow these instructions carefully:  1.  Shower with CHG Soap the night before surgery and  the  morning of Surgery.  2.  If you choose to wash your hair, wash your hair first as usual with your  normal  shampoo.  3.  After you shampoo, rinse your hair and body thoroughly to remove the  shampoo.                           4.  Use CHG as you would any other liquid soap.  You can apply chg directly  to the skin and wash  Gently with a scrungie or clean washcloth.  5.  Apply the CHG Soap to your body ONLY FROM THE NECK DOWN.   Do not use on face/ open                           Wound or open sores. Avoid contact with eyes, ears mouth and genitals (private parts).                       Wash face,  Genitals (private parts) with your normal soap.             6.  Wash thoroughly, paying special attention to the area where your surgery  will be performed.  7.  Thoroughly rinse your body with warm water from the neck down.  8.  DO NOT shower/wash with your normal soap after using and rinsing off  the CHG Soap.                9.  Pat yourself dry with a clean towel.            10.  Wear clean pajamas.            11.  Place clean sheets on your bed the night of your first shower and do not  sleep with pets. Day of Surgery : Do not apply any lotions/deodorants the morning of surgery.  Please wear clean clothes to the hospital/surgery center.  FAILURE TO FOLLOW THESE INSTRUCTIONS MAY RESULT IN THE CANCELLATION OF YOUR SURGERY PATIENT SIGNATURE_________________________________  NURSE SIGNATURE__________________________________  ________________________________________________________________________

## 2014-03-06 ENCOUNTER — Encounter (HOSPITAL_COMMUNITY): Payer: Self-pay

## 2014-03-06 ENCOUNTER — Encounter (HOSPITAL_COMMUNITY)
Admission: RE | Admit: 2014-03-06 | Discharge: 2014-03-06 | Disposition: A | Discharge status: Home / Self Care | Payer: 59 | Source: Ambulatory Visit | Attending: Orthopaedic Surgery | Admitting: Orthopaedic Surgery

## 2014-03-06 DIAGNOSIS — Z01818 Encounter for other preprocedural examination: Secondary | ICD-10-CM | POA: Insufficient documentation

## 2014-03-06 DIAGNOSIS — M25562 Pain in left knee: Secondary | ICD-10-CM | POA: Diagnosis not present

## 2014-03-06 DIAGNOSIS — M1611 Unilateral primary osteoarthritis, right hip: Secondary | ICD-10-CM | POA: Diagnosis not present

## 2014-03-06 LAB — BASIC METABOLIC PANEL
Anion gap: 9 (ref 5–15)
BUN: 18 mg/dL (ref 6–23)
CO2: 27 mmol/L (ref 19–32)
Calcium: 9.9 mg/dL (ref 8.4–10.5)
Chloride: 104 mmol/L (ref 96–112)
Creatinine, Ser: 1.11 mg/dL (ref 0.50–1.35)
GFR calc Af Amer: 82 mL/min — ABNORMAL LOW (ref >90)
GFR calc non Af Amer: 71 mL/min — ABNORMAL LOW (ref >90)
Glucose, Bld: 106 mg/dL — ABNORMAL HIGH (ref 70–99)
Potassium: 4.1 mmol/L (ref 3.5–5.1)
Sodium: 140 mmol/L (ref 135–145)

## 2014-03-06 LAB — CBC
HCT: 42.1 % (ref 39.0–52.0)
Hemoglobin: 14 g/dL (ref 13.0–17.0)
MCH: 32 pg (ref 26.0–34.0)
MCHC: 33.3 g/dL (ref 30.0–36.0)
MCV: 96.1 fL (ref 78.0–100.0)
Platelets: 317 10*3/uL (ref 150–400)
RBC: 4.38 MIL/uL (ref 4.22–5.81)
RDW: 13.1 % (ref 11.5–15.5)
WBC: 8.7 10*3/uL (ref 4.0–10.5)

## 2014-03-06 LAB — SURGICAL PCR SCREEN
MRSA, PCR: NEGATIVE
Staphylococcus aureus: NEGATIVE

## 2014-03-06 LAB — APTT: aPTT: 37 seconds (ref 24–37)

## 2014-03-06 LAB — PROTIME-INR
INR: 1.04 (ref 0.00–1.49)
Prothrombin Time: 13.7 seconds (ref 11.6–15.2)

## 2014-03-06 NOTE — Progress Notes (Signed)
ekg 03/14/14  epic

## 2014-03-06 NOTE — Progress Notes (Signed)
   03/06/14 1500  OBSTRUCTIVE SLEEP APNEA  Have you ever been diagnosed with sleep apnea through a sleep study? No  Do you snore loudly (loud enough to be heard through closed doors)?  1  Do you often feel tired, fatigued, or sleepy during the daytime? 1  Has anyone observed you stop breathing during your sleep? 1  Do you have, or are you being treated for high blood pressure? 0  BMI more than 35 kg/m2? 0  Age over 60 years old? 1  Neck circumference greater than 40 cm/16 inches? 1  Gender: 1  Obstructive Sleep Apnea Score 6  Score 4 or greater  Results sent to PCP

## 2014-03-06 NOTE — Progress Notes (Signed)
Patient is aware has to have type and screen drawn morning of surgery and slim possibility if there are abnormalities surgery could be cancelled

## 2014-03-07 ENCOUNTER — Other Ambulatory Visit: Payer: 59 | Admitting: Internal Medicine

## 2014-03-07 DIAGNOSIS — Z125 Encounter for screening for malignant neoplasm of prostate: Secondary | ICD-10-CM

## 2014-03-07 DIAGNOSIS — Z0000 Encounter for general adult medical examination without abnormal findings: Secondary | ICD-10-CM

## 2014-03-07 DIAGNOSIS — Z13220 Encounter for screening for lipoid disorders: Secondary | ICD-10-CM

## 2014-03-07 LAB — CBC WITH DIFFERENTIAL/PLATELET
Basophils Absolute: 0.1 10*3/uL (ref 0.0–0.1)
Basophils Relative: 1 % (ref 0–1)
Eosinophils Absolute: 0.2 10*3/uL (ref 0.0–0.7)
Eosinophils Relative: 2 % (ref 0–5)
HCT: 42.6 % (ref 39.0–52.0)
Hemoglobin: 14.4 g/dL (ref 13.0–17.0)
Lymphocytes Relative: 26 % (ref 12–46)
Lymphs Abs: 2.2 10*3/uL (ref 0.7–4.0)
MCH: 31.9 pg (ref 26.0–34.0)
MCHC: 33.8 g/dL (ref 30.0–36.0)
MCV: 94.2 fL (ref 78.0–100.0)
MPV: 11.8 fL (ref 8.6–12.4)
Monocytes Absolute: 0.9 10*3/uL (ref 0.1–1.0)
Monocytes Relative: 10 % (ref 3–12)
Neutro Abs: 5.2 10*3/uL (ref 1.7–7.7)
Neutrophils Relative %: 61 % (ref 43–77)
Platelets: 371 10*3/uL (ref 150–400)
RBC: 4.52 MIL/uL (ref 4.22–5.81)
RDW: 13.2 % (ref 11.5–15.5)
WBC: 8.6 10*3/uL (ref 4.0–10.5)

## 2014-03-07 LAB — COMPREHENSIVE METABOLIC PANEL
ALT: 17 U/L (ref 0–53)
AST: 23 U/L (ref 0–37)
Albumin: 4.6 g/dL (ref 3.5–5.2)
Alkaline Phosphatase: 57 U/L (ref 39–117)
BUN: 16 mg/dL (ref 6–23)
CO2: 26 mEq/L (ref 19–32)
Calcium: 10.1 mg/dL (ref 8.4–10.5)
Chloride: 102 mEq/L (ref 96–112)
Creat: 0.95 mg/dL (ref 0.50–1.35)
Glucose, Bld: 94 mg/dL (ref 70–99)
Potassium: 5 mEq/L (ref 3.5–5.3)
Sodium: 137 mEq/L (ref 135–145)
Total Bilirubin: 0.8 mg/dL (ref 0.2–1.2)
Total Protein: 7.3 g/dL (ref 6.0–8.3)

## 2014-03-07 LAB — LIPID PANEL
Cholesterol: 247 mg/dL — ABNORMAL HIGH (ref 0–200)
HDL: 72 mg/dL (ref >39)
LDL Cholesterol: 159 mg/dL — ABNORMAL HIGH (ref 0–99)
Total CHOL/HDL Ratio: 3.4 Ratio
Triglycerides: 80 mg/dL (ref <150)
VLDL: 16 mg/dL (ref 0–40)

## 2014-03-08 LAB — PSA: PSA: 1.6 ng/mL (ref <=4.00)

## 2014-03-11 ENCOUNTER — Other Ambulatory Visit (HOSPITAL_COMMUNITY): Payer: Self-pay | Admitting: Orthopaedic Surgery

## 2014-03-14 ENCOUNTER — Inpatient Hospital Stay (HOSPITAL_COMMUNITY): Payer: 59

## 2014-03-14 ENCOUNTER — Inpatient Hospital Stay (HOSPITAL_COMMUNITY): Payer: 59 | Admitting: Anesthesiology

## 2014-03-14 ENCOUNTER — Encounter (HOSPITAL_COMMUNITY)
Admission: RE | Disposition: A | Discharge status: Home Health | Payer: Self-pay | Source: Ambulatory Visit | Attending: Orthopaedic Surgery

## 2014-03-14 ENCOUNTER — Inpatient Hospital Stay (HOSPITAL_COMMUNITY)
Admission: RE | Admit: 2014-03-14 | Discharge: 2014-03-16 | DRG: 470 | Disposition: A | Discharge status: Home Health | Payer: 59 | Source: Ambulatory Visit | Attending: Orthopaedic Surgery | Admitting: Orthopaedic Surgery

## 2014-03-14 ENCOUNTER — Encounter (HOSPITAL_COMMUNITY): Payer: Self-pay | Admitting: Anesthesiology

## 2014-03-14 DIAGNOSIS — Z01812 Encounter for preprocedural laboratory examination: Secondary | ICD-10-CM

## 2014-03-14 DIAGNOSIS — M1611 Unilateral primary osteoarthritis, right hip: Secondary | ICD-10-CM | POA: Diagnosis present

## 2014-03-14 DIAGNOSIS — K219 Gastro-esophageal reflux disease without esophagitis: Secondary | ICD-10-CM | POA: Diagnosis present

## 2014-03-14 DIAGNOSIS — E785 Hyperlipidemia, unspecified: Secondary | ICD-10-CM | POA: Diagnosis present

## 2014-03-14 DIAGNOSIS — Z419 Encounter for procedure for purposes other than remedying health state, unspecified: Secondary | ICD-10-CM

## 2014-03-14 DIAGNOSIS — K449 Diaphragmatic hernia without obstruction or gangrene: Secondary | ICD-10-CM | POA: Diagnosis present

## 2014-03-14 DIAGNOSIS — Z96641 Presence of right artificial hip joint: Secondary | ICD-10-CM

## 2014-03-14 DIAGNOSIS — F17210 Nicotine dependence, cigarettes, uncomplicated: Secondary | ICD-10-CM | POA: Diagnosis present

## 2014-03-14 DIAGNOSIS — M25551 Pain in right hip: Secondary | ICD-10-CM | POA: Diagnosis present

## 2014-03-14 HISTORY — PX: TOTAL HIP ARTHROPLASTY: SHX124

## 2014-03-14 LAB — TYPE AND SCREEN
ABO/RH(D): O POS
Antibody Screen: NEGATIVE

## 2014-03-14 LAB — ABO/RH: ABO/RH(D): O POS

## 2014-03-14 SURGERY — ARTHROPLASTY, HIP, TOTAL, ANTERIOR APPROACH
Anesthesia: Monitor Anesthesia Care | Site: Hip | Laterality: Right

## 2014-03-14 MED ORDER — FENTANYL CITRATE 0.05 MG/ML IJ SOLN
INTRAMUSCULAR | Status: AC
  Filled 2014-03-14: qty 2

## 2014-03-14 MED ORDER — METHOCARBAMOL 500 MG PO TABS
500.0000 mg | ORAL_TABLET | Freq: Four times a day (QID) | ORAL | Status: DC | PRN
  Administered 2014-03-14 – 2014-03-16 (×6): 500 mg via ORAL
  Filled 2014-03-14 (×6): qty 1

## 2014-03-14 MED ORDER — LACTATED RINGERS IV SOLN
INTRAVENOUS | Status: DC
  Administered 2014-03-14: 1000 mL via INTRAVENOUS

## 2014-03-14 MED ORDER — DOCUSATE SODIUM 100 MG PO CAPS
100.0000 mg | ORAL_CAPSULE | Freq: Two times a day (BID) | ORAL | Status: DC
  Administered 2014-03-14 – 2014-03-16 (×4): 100 mg via ORAL

## 2014-03-14 MED ORDER — MENTHOL 3 MG MT LOZG: 1.0000 | LOZENGE | OROMUCOSAL | Status: DC | PRN

## 2014-03-14 MED ORDER — TRANEXAMIC ACID 100 MG/ML IV SOLN
1000.0000 mg | INTRAVENOUS | Status: AC
Start: 2014-03-14 — End: 2014-03-14
  Administered 2014-03-14: 1000 mg via INTRAVENOUS
  Filled 2014-03-14: qty 10

## 2014-03-14 MED ORDER — ACETAMINOPHEN 650 MG RE SUPP
650.0000 mg | Freq: Four times a day (QID) | RECTAL | Status: DC | PRN
Start: 2014-03-14 — End: 2014-03-16

## 2014-03-14 MED ORDER — ONDANSETRON HCL 4 MG/2ML IJ SOLN
INTRAMUSCULAR | Status: DC | PRN
  Administered 2014-03-14: 4 mg via INTRAVENOUS

## 2014-03-14 MED ORDER — BUPIVACAINE LIPOSOME 1.3 % IJ SUSP
20.0000 mL | Freq: Once | INTRAMUSCULAR | Status: DC
  Filled 2014-03-14: qty 20

## 2014-03-14 MED ORDER — ADULT MULTIVITAMIN W/MINERALS CH
1.0000 | ORAL_TABLET | Freq: Every day | ORAL | Status: DC
  Administered 2014-03-15 – 2014-03-16 (×2): 1 via ORAL
  Filled 2014-03-14 (×2): qty 1

## 2014-03-14 MED ORDER — METOCLOPRAMIDE HCL 5 MG/ML IJ SOLN: 5.0000 mg | Freq: Three times a day (TID) | INTRAMUSCULAR | Status: DC | PRN

## 2014-03-14 MED ORDER — METHOCARBAMOL 1000 MG/10ML IJ SOLN
500.0000 mg | Freq: Four times a day (QID) | INTRAVENOUS | Status: DC | PRN
  Filled 2014-03-14: qty 5

## 2014-03-14 MED ORDER — CEFAZOLIN SODIUM-DEXTROSE 2-3 GM-% IV SOLR
2.0000 g | INTRAVENOUS | Status: AC
  Administered 2014-03-14: 2 g via INTRAVENOUS

## 2014-03-14 MED ORDER — OXYCODONE HCL 5 MG PO TABS
5.0000 mg | ORAL_TABLET | ORAL | Status: DC | PRN
  Administered 2014-03-14 – 2014-03-16 (×11): 10 mg via ORAL
  Filled 2014-03-14 (×11): qty 2

## 2014-03-14 MED ORDER — METOCLOPRAMIDE HCL 10 MG PO TABS: 5.0000 mg | ORAL_TABLET | Freq: Three times a day (TID) | ORAL | Status: DC | PRN

## 2014-03-14 MED ORDER — PROPOFOL 10 MG/ML IV BOLUS
INTRAVENOUS | Status: AC
  Filled 2014-03-14: qty 20

## 2014-03-14 MED ORDER — DIPHENHYDRAMINE HCL 12.5 MG/5ML PO ELIX: 12.5000 mg | ORAL_SOLUTION | ORAL | Status: DC | PRN

## 2014-03-14 MED ORDER — OXYCODONE HCL 5 MG/5ML PO SOLN
5.0000 mg | Freq: Once | ORAL | Status: DC | PRN
  Filled 2014-03-14: qty 5

## 2014-03-14 MED ORDER — ACETAMINOPHEN 325 MG PO TABS: 650.0000 mg | ORAL_TABLET | Freq: Four times a day (QID) | ORAL | Status: DC | PRN

## 2014-03-14 MED ORDER — PHENYLEPHRINE HCL 10 MG/ML IJ SOLN
INTRAMUSCULAR | Status: AC
  Filled 2014-03-14: qty 1

## 2014-03-14 MED ORDER — SODIUM CHLORIDE 0.9 % IR SOLN
Status: DC | PRN
  Administered 2014-03-14: 1000 mL

## 2014-03-14 MED ORDER — PHENYLEPHRINE HCL 10 MG/ML IJ SOLN
INTRAMUSCULAR | Status: DC | PRN
  Administered 2014-03-14 (×2): 40 ug via INTRAVENOUS
  Administered 2014-03-14 (×3): 80 ug via INTRAVENOUS

## 2014-03-14 MED ORDER — LACTATED RINGERS IV SOLN
INTRAVENOUS | Status: DC | PRN
  Administered 2014-03-14 (×3): via INTRAVENOUS

## 2014-03-14 MED ORDER — ONDANSETRON HCL 4 MG PO TABS: 4.0000 mg | ORAL_TABLET | Freq: Four times a day (QID) | ORAL | Status: DC | PRN

## 2014-03-14 MED ORDER — HYDROMORPHONE HCL 1 MG/ML IJ SOLN: 0.2500 mg | INTRAMUSCULAR | Status: DC | PRN

## 2014-03-14 MED ORDER — STERILE WATER FOR IRRIGATION IR SOLN
Status: DC | PRN
  Administered 2014-03-14: 1500 mL

## 2014-03-14 MED ORDER — CEFAZOLIN SODIUM 1-5 GM-% IV SOLN
1.0000 g | Freq: Four times a day (QID) | INTRAVENOUS | Status: AC
  Administered 2014-03-14 – 2014-03-15 (×2): 1 g via INTRAVENOUS
  Filled 2014-03-14 (×2): qty 50

## 2014-03-14 MED ORDER — OXYCODONE HCL 5 MG PO TABS: 5.0000 mg | ORAL_TABLET | Freq: Once | ORAL | Status: DC | PRN

## 2014-03-14 MED ORDER — ONDANSETRON HCL 4 MG/2ML IJ SOLN: 4.0000 mg | Freq: Four times a day (QID) | INTRAMUSCULAR | Status: DC | PRN

## 2014-03-14 MED ORDER — LIDOCAINE HCL (CARDIAC) 20 MG/ML IV SOLN
INTRAVENOUS | Status: DC | PRN
  Administered 2014-03-14: 60 mg via INTRAVENOUS

## 2014-03-14 MED ORDER — SODIUM CHLORIDE 0.9 % IV SOLN
10.0000 mg | INTRAVENOUS | Status: DC | PRN
  Administered 2014-03-14: 15 ug/min via INTRAVENOUS

## 2014-03-14 MED ORDER — ZOLPIDEM TARTRATE 5 MG PO TABS: 5.0000 mg | ORAL_TABLET | Freq: Every evening | ORAL | Status: DC | PRN

## 2014-03-14 MED ORDER — HYDROMORPHONE HCL 1 MG/ML IJ SOLN
1.0000 mg | INTRAMUSCULAR | Status: DC | PRN
  Administered 2014-03-14 – 2014-03-15 (×3): 1 mg via INTRAVENOUS
  Filled 2014-03-14 (×3): qty 1

## 2014-03-14 MED ORDER — DEXAMETHASONE SODIUM PHOSPHATE 10 MG/ML IJ SOLN
INTRAMUSCULAR | Status: DC | PRN
  Administered 2014-03-14: 10 mg via INTRAVENOUS

## 2014-03-14 MED ORDER — FENTANYL CITRATE 0.05 MG/ML IJ SOLN
INTRAMUSCULAR | Status: DC | PRN
  Administered 2014-03-14 (×2): 50 ug via INTRAVENOUS

## 2014-03-14 MED ORDER — CEFAZOLIN SODIUM-DEXTROSE 2-3 GM-% IV SOLR
INTRAVENOUS | Status: AC
  Filled 2014-03-14: qty 50

## 2014-03-14 MED ORDER — PROPOFOL 10 MG/ML IV BOLUS
INTRAVENOUS | Status: DC | PRN
  Administered 2014-03-14 (×4): 10 mg via INTRAVENOUS

## 2014-03-14 MED ORDER — PANTOPRAZOLE SODIUM 40 MG PO TBEC
40.0000 mg | DELAYED_RELEASE_TABLET | Freq: Every day | ORAL | Status: DC
  Administered 2014-03-15 – 2014-03-16 (×2): 40 mg via ORAL
  Filled 2014-03-14 (×2): qty 1

## 2014-03-14 MED ORDER — ASPIRIN EC 325 MG PO TBEC
325.0000 mg | DELAYED_RELEASE_TABLET | Freq: Two times a day (BID) | ORAL | Status: DC
  Administered 2014-03-15 – 2014-03-16 (×3): 325 mg via ORAL
  Filled 2014-03-14 (×5): qty 1

## 2014-03-14 MED ORDER — SODIUM CHLORIDE 0.9 % IV SOLN: 1000.0000 mg | INTRAVENOUS | Status: DC

## 2014-03-14 MED ORDER — PHENOL 1.4 % MT LIQD: 1.0000 | OROMUCOSAL | Status: DC | PRN

## 2014-03-14 MED ORDER — SODIUM CHLORIDE 0.9 % IV SOLN
INTRAVENOUS | Status: DC
  Administered 2014-03-14: 18:00:00 via INTRAVENOUS

## 2014-03-14 MED ORDER — ALUM & MAG HYDROXIDE-SIMETH 200-200-20 MG/5ML PO SUSP: 30.0000 mL | ORAL | Status: DC | PRN

## 2014-03-14 MED ORDER — MIDAZOLAM HCL 2 MG/2ML IJ SOLN
INTRAMUSCULAR | Status: AC
  Filled 2014-03-14: qty 2

## 2014-03-14 MED ORDER — DEXAMETHASONE SODIUM PHOSPHATE 10 MG/ML IJ SOLN
INTRAMUSCULAR | Status: AC
  Filled 2014-03-14: qty 1

## 2014-03-14 MED ORDER — LIDOCAINE HCL (CARDIAC) 20 MG/ML IV SOLN
INTRAVENOUS | Status: AC
  Filled 2014-03-14: qty 5

## 2014-03-14 MED ORDER — PROMETHAZINE HCL 25 MG/ML IJ SOLN: 6.2500 mg | INTRAMUSCULAR | Status: DC | PRN

## 2014-03-14 MED ORDER — PROPOFOL INFUSION 10 MG/ML OPTIME
INTRAVENOUS | Status: DC | PRN
  Administered 2014-03-14: 100 ug/kg/min via INTRAVENOUS

## 2014-03-14 MED ORDER — MIDAZOLAM HCL 5 MG/5ML IJ SOLN
INTRAMUSCULAR | Status: DC | PRN
  Administered 2014-03-14: 2 mg via INTRAVENOUS

## 2014-03-14 SURGICAL SUPPLY — 43 items
APL SKNCLS STERI-STRIP NONHPOA (GAUZE/BANDAGES/DRESSINGS) ×2
BAG DECANTER FOR FLEXI CONT (MISCELLANEOUS) ×3 IMPLANT
BAG SPEC THK2 15X12 ZIP CLS (MISCELLANEOUS)
BAG ZIPLOCK 12X15 (MISCELLANEOUS) IMPLANT
BENZOIN TINCTURE PRP APPL 2/3 (GAUZE/BANDAGES/DRESSINGS) ×2 IMPLANT
BLADE SAW SGTL 18X1.27X75 (BLADE) ×3 IMPLANT
CAPT HIP TOTAL 2 ×2 IMPLANT
CELLS DAT CNTRL 66122 CELL SVR (MISCELLANEOUS) ×2 IMPLANT
COVER PERINEAL POST (MISCELLANEOUS) ×3 IMPLANT
DRAPE C-ARM 42X120 X-RAY (DRAPES) ×3 IMPLANT
DRAPE STERI IOBAN 125X83 (DRAPES) ×3 IMPLANT
DRAPE U-SHAPE 47X51 STRL (DRAPES) ×9 IMPLANT
DRSG AQUACEL AG ADV 3.5X10 (GAUZE/BANDAGES/DRESSINGS) ×3 IMPLANT
DURAPREP 26ML APPLICATOR (WOUND CARE) ×3 IMPLANT
ELECT BLADE TIP CTD 4 INCH (ELECTRODE) ×3 IMPLANT
ELECT REM PT RETURN 9FT ADLT (ELECTROSURGICAL) ×3
ELECTRODE REM PT RTRN 9FT ADLT (ELECTROSURGICAL) ×2 IMPLANT
FACESHIELD WRAPAROUND (MASK) ×12 IMPLANT
FACESHIELD WRAPAROUND OR TEAM (MASK) ×4 IMPLANT
GAUZE XEROFORM 1X8 LF (GAUZE/BANDAGES/DRESSINGS) IMPLANT
GLOVE BIO SURGEON STRL SZ7.5 (GLOVE) ×3 IMPLANT
GLOVE BIOGEL PI IND STRL 8 (GLOVE) ×4 IMPLANT
GLOVE BIOGEL PI INDICATOR 8 (GLOVE) ×2
GLOVE ECLIPSE 8.0 STRL XLNG CF (GLOVE) ×3 IMPLANT
GOWN STRL REUS W/TWL XL LVL3 (GOWN DISPOSABLE) ×6 IMPLANT
HANDPIECE INTERPULSE COAX TIP (DISPOSABLE) ×3
KIT BASIN OR (CUSTOM PROCEDURE TRAY) ×3 IMPLANT
PACK TOTAL JOINT (CUSTOM PROCEDURE TRAY) ×3 IMPLANT
PEN SKIN MARKING BROAD (MISCELLANEOUS) ×3 IMPLANT
RETRACTOR WND ALEXIS 18 MED (MISCELLANEOUS) ×1 IMPLANT
RTRCTR WOUND ALEXIS 18CM MED (MISCELLANEOUS) ×3
SET HNDPC FAN SPRY TIP SCT (DISPOSABLE) ×2 IMPLANT
STAPLER VISISTAT 35W (STAPLE) IMPLANT
STRIP CLOSURE SKIN 1/2X4 (GAUZE/BANDAGES/DRESSINGS) ×2 IMPLANT
SUT ETHIBOND NAB CT1 #1 30IN (SUTURE) ×3 IMPLANT
SUT MNCRL AB 4-0 PS2 18 (SUTURE) IMPLANT
SUT VIC AB 0 CT1 36 (SUTURE) ×3 IMPLANT
SUT VIC AB 1 CT1 36 (SUTURE) ×3 IMPLANT
SUT VIC AB 2-0 CT1 27 (SUTURE) ×6
SUT VIC AB 2-0 CT1 TAPERPNT 27 (SUTURE) ×4 IMPLANT
TOWEL OR 17X26 10 PK STRL BLUE (TOWEL DISPOSABLE) ×3 IMPLANT
TOWEL OR NON WOVEN STRL DISP B (DISPOSABLE) ×3 IMPLANT
TRAY FOLEY CATH 14FRSI W/METER (CATHETERS) ×3 IMPLANT

## 2014-03-14 NOTE — Plan of Care (Signed)
Problem: Consults Goal: Diagnosis- Total Joint Replacement Right anterior hip

## 2014-03-14 NOTE — Transfer of Care (Signed)
Immediate Anesthesia Transfer of Care Note  Patient: James [name]  Procedure(s) Performed: Procedure(s): RIGHT TOTAL HIP ARTHROPLASTY ANTERIOR APPROACH  (Right)  Patient Location: PACU  Anesthesia Type:Spinal  Level of Consciousness: awake, alert , oriented and patient cooperative  Airway & Oxygen Therapy: Patient Spontanous Breathing and Patient connected to face mask oxygen  Post-op Assessment: Report given to RN and Post -op Vital signs reviewed and stable  Post vital signs: Reviewed and stable  Last Vitals:  Filed Vitals:   03/14/14 1013  BP: 122/89  Pulse: 90  Temp: 69.4 C    Complications: No apparent anesthesia complications

## 2014-03-14 NOTE — Anesthesia Procedure Notes (Signed)
Spinal Patient location during procedure: OR Start time: 03/14/2014 12:37 PM End time: 03/14/2014 12:09 PM Staffing Resident/CRNA: Sherian Maroon A Performed by: resident/CRNA  Preanesthetic Checklist Completed: patient identified, site marked, surgical consent, pre-op evaluation, timeout performed, IV checked, risks and benefits discussed and monitors and equipment checked Spinal Block Patient position: sitting Prep: Betadine Patient monitoring: heart rate, cardiac monitor, continuous pulse ox and blood pressure Approach: midline Location: L4-5 Injection technique: single-shot Needle Needle type: Sprotte  Needle gauge: 24 G Needle length: 9 cm Needle insertion depth: 6 cm Assessment Sensory level: T8

## 2014-03-14 NOTE — Brief Op Note (Signed)
03/14/2014  1:15 PM  PATIENT:  James [name]  59 y.o. male  PRE-OPERATIVE DIAGNOSIS:  Right hip painful primary osteoarthritis  POST-OPERATIVE DIAGNOSIS:  Right hip painful primary osteoarthritis  PROCEDURE:  Procedure(s): RIGHT TOTAL HIP ARTHROPLASTY ANTERIOR APPROACH  (Right)  SURGEON:  Surgeon(s) and Role:    * Mcarthur Rossetti, MD - Primary  PHYSICIAN ASSISTANT: Benita Stabile, PA-C  ANESTHESIA:   spinal  EBL:  Total I/O In: 1000 [I.V.:1000] Out: 380 [Urine:80; Blood:300]  BLOOD ADMINISTERED:none  DRAINS: none   LOCAL MEDICATIONS USED:  NONE  SPECIMEN:  No Specimen  DISPOSITION OF SPECIMEN:  N/A  COUNTS:  YES  TOURNIQUET:  * No tourniquets in log *  DICTATION: .Other Dictation: Dictation Number [id number]  PLAN OF CARE: Admit to inpatient   PATIENT DISPOSITION:  PACU - hemodynamically stable.   Delay start of Pharmacological VTE agent (>24hrs) due to surgical blood loss or risk of bleeding: no

## 2014-03-14 NOTE — H&P (Signed)
TOTAL HIP ADMISSION H&P  Patient is admitted for right total hip arthroplasty.  Subjective:  Chief Complaint: right hip pain  HPI: James [name], 59 y.o. male, has a history of pain and functional disability in the right hip(s) due to arthritis and patient has failed non-surgical conservative treatments for greater than 12 weeks to include NSAID's and/or analgesics, corticosteriod injections, flexibility and strengthening excercises, use of assistive devices, weight reduction as appropriate and activity modification.  Onset of symptoms was gradual starting 4 years ago with gradually worsening course since that time.The patient noted no past surgery on the right hip(s).  Patient currently rates pain in the right hip at 10 out of 10 with activity. Patient has night pain, worsening of pain with activity and weight bearing, pain that interfers with activities of daily living and pain with passive range of motion. Patient has evidence of subchondral cysts, subchondral sclerosis, periarticular osteophytes and joint space narrowing by imaging studies. This condition presents safety issues increasing the risk of falls.  There is no current active infection.  Patient Active Problem List   Diagnosis Date Noted  . Osteoarthritis of right hip 03/04/2014  . Allergic rhinitis 03/04/2014  . History of adenomatous polyp of colon 08/19/2011  . Segmental colitis 08/19/2011  . GE reflux 11/14/2010  . Low back pain 11/14/2010  . History of smoking 11/14/2010  . Hyperlipidemia 11/14/2010   Past Medical History  Diagnosis Date  . Hiatal hernia   . GERD (gastroesophageal reflux disease)   . Degenerative disc disease   . Hyperlipidemia   . Lower back pain   . Internal hemorrhoids 2013    Colonoscopy  . Diverticulosis 2013    Colonoscopy  . Hx of colonic polyps 2013    Colonoscopy- tubular adenoma, and hyperplastic     Past Surgical History  Procedure Laterality Date  . Hernia repair  1980    left  inguinal  . Colonoscopy      No prescriptions prior to admission   No Known Allergies  History  Substance Use Topics  . Smoking status: Current Every Day Smoker -- 0.25 packs/day for 30 years    Types: Cigarettes  . Smokeless tobacco: Never Used     Comment: Counseling sheet given 08-2011  . Alcohol Use: 4.2 oz/week    7 Standard drinks or equivalent per week     Comment: week    Family History  Problem Relation Age of Onset  . Heart disease Father   . Colon cancer Father 45  . Esophageal cancer Father     late 79's  . Asthma Daughter   . Colon cancer Mother   . Stomach cancer Neg Hx      Review of Systems  Musculoskeletal: Positive for joint pain.  All other systems reviewed and are negative.   Objective:  Physical Exam  Constitutional: He is oriented to person, place, and time. He appears well-developed and well-nourished.  HENT:  Head: Normocephalic and atraumatic.  Eyes: EOM are normal. Pupils are equal, round, and reactive to light.  Neck: Normal range of motion. Neck supple.  Cardiovascular: Normal rate and regular rhythm.   Respiratory: Effort normal and breath sounds normal.  GI: Soft. Bowel sounds are normal.  Musculoskeletal:       Right hip: He exhibits decreased range of motion, decreased strength, tenderness and bony tenderness.       Right knee: Tenderness found. Medial joint line and lateral joint line tenderness noted.  Neurological: He is alert  and oriented to person, place, and time.  Skin: Skin is dry.  Psychiatric: He has a normal mood and affect.    Vital signs in last 24 hours:    Labs:   Estimated body mass index is 28.75 kg/(m^2) as calculated from the following:   Height as of 12/03/13: 6' (1.829 m).   Weight as of 12/03/13: 96.163 kg (212 lb).   Imaging Review Plain radiographs demonstrate severe degenerative joint disease of the right hip(s). The bone quality appears to be excellent for age and reported activity  level.  Assessment/Plan:  End stage arthritis, right hip(s)  The patient history, physical examination, clinical judgement of the provider and imaging studies are consistent with end stage degenerative joint disease of the right hip(s) and total hip arthroplasty is deemed medically necessary. The treatment options including medical management, injection therapy, arthroscopy and arthroplasty were discussed at length. The risks and benefits of total hip arthroplasty were presented and reviewed. The risks due to aseptic loosening, infection, stiffness, dislocation/subluxation,  thromboembolic complications and other imponderables were discussed.  The patient acknowledged the explanation, agreed to proceed with the plan and consent was signed. Patient is being admitted for inpatient treatment for surgery, pain control, PT, OT, prophylactic antibiotics, VTE prophylaxis, progressive ambulation and ADL's and discharge planning.The patient is planning to be discharged home with home health services

## 2014-03-14 NOTE — Anesthesia Postprocedure Evaluation (Signed)
Anesthesia Post Note  Patient: James [name]  Procedure(s) Performed: Procedure(s) (LRB): RIGHT TOTAL HIP ARTHROPLASTY ANTERIOR APPROACH  (Right)  Anesthesia type: MAC/SAB  Patient location: PACU  Post pain: Pain level controlled  Post assessment: Patient's Cardiovascular Status Stable  Last Vitals:  Filed Vitals:   03/14/14 1500  BP: 100/58  Pulse: 63  Temp:   Resp: 15    Post vital signs: Reviewed and stable  Level of consciousness: sedated  Complications: No apparent anesthesia complications

## 2014-03-14 NOTE — Anesthesia Preprocedure Evaluation (Addendum)
Anesthesia Evaluation  Patient identified by MRN, date of birth, ID band Patient awake    Reviewed: Allergy & Precautions, NPO status   History of Anesthesia Complications Negative for: history of anesthetic complications  Airway Mallampati: II  TM Distance: >3 FB Neck ROM: Full    Dental  (+) Teeth Intact, Dental Advisory Given   Pulmonary Current Smoker,    Pulmonary exam normal       Cardiovascular negative cardio ROS      Neuro/Psych negative neurological ROS  negative psych ROS   GI/Hepatic Neg liver ROS, hiatal hernia, GERD-  ,  Endo/Other  negative endocrine ROS  Renal/GU negative Renal ROS     Musculoskeletal  (+) Arthritis -,   Abdominal   Peds  Hematology   Anesthesia Other Findings   Reproductive/Obstetrics                            Anesthesia Physical Anesthesia Plan  ASA: II  Anesthesia Plan: MAC and Spinal   Post-op Pain Management:    Induction: Intravenous  Airway Management Planned: Simple Face Mask  Additional Equipment:   Intra-op Plan:   Post-operative Plan:   Informed Consent: I have reviewed the patients History and Physical, chart, labs and discussed the procedure including the risks, benefits and alternatives for the proposed anesthesia with the patient or authorized representative who has indicated his/her understanding and acceptance.   Dental advisory given  Plan Discussed with: CRNA, Anesthesiologist and Surgeon  Anesthesia Plan Comments:        Anesthesia Quick Evaluation

## 2014-03-15 LAB — CBC
HCT: 35.7 % — ABNORMAL LOW (ref 39.0–52.0)
Hemoglobin: 11.7 g/dL — ABNORMAL LOW (ref 13.0–17.0)
MCH: 31.8 pg (ref 26.0–34.0)
MCHC: 32.8 g/dL (ref 30.0–36.0)
MCV: 97 fL (ref 78.0–100.0)
Platelets: 272 10*3/uL (ref 150–400)
RBC: 3.68 MIL/uL — ABNORMAL LOW (ref 4.22–5.81)
RDW: 12.7 % (ref 11.5–15.5)
WBC: 13.8 10*3/uL — ABNORMAL HIGH (ref 4.0–10.5)

## 2014-03-15 LAB — BASIC METABOLIC PANEL
Anion gap: 6 (ref 5–15)
BUN: 13 mg/dL (ref 6–23)
CO2: 27 mmol/L (ref 19–32)
Calcium: 8.8 mg/dL (ref 8.4–10.5)
Chloride: 103 mmol/L (ref 96–112)
Creatinine, Ser: 0.94 mg/dL (ref 0.50–1.35)
GFR calc Af Amer: >90 mL/min (ref >90)
GFR calc non Af Amer: 90 mL/min — ABNORMAL LOW (ref >90)
Glucose, Bld: 119 mg/dL — ABNORMAL HIGH (ref 70–99)
Potassium: 3.7 mmol/L (ref 3.5–5.1)
Sodium: 136 mmol/L (ref 135–145)

## 2014-03-15 MED ORDER — OXYCODONE-ACETAMINOPHEN 5-325 MG PO TABS: 1.0000 | ORAL_TABLET | ORAL | Status: DC | PRN

## 2014-03-15 MED ORDER — ASPIRIN 325 MG PO TBEC: 325.0000 mg | DELAYED_RELEASE_TABLET | Freq: Two times a day (BID) | ORAL | Status: DC

## 2014-03-15 NOTE — Progress Notes (Signed)
Pt had question re: should he continue taking run of azithromycin (244m one daily) started for cold prior to surgery. Dr BNinfa Lindennotified by phone & stated pt did not need to resume med. Message passed on to pt. James [name], [employer]

## 2014-03-15 NOTE — Evaluation (Signed)
Occupational Therapy Evaluation Patient Details Name: James [name] MRN: [id number] DOB: 1955-03-26 Today's Date: 03/15/2014    History of Present Illness  R DATHA   Clinical Impression   Pt practiced toilet transfer to handicap height commode. Will further assess toilet transfers to see if 3in1 needed. Pt educated on AE options and he will decide if he would like to obtain. Will continue to follow for acute OT to progress ADL independence.     Follow Up Recommendations  No OT follow up;Supervision/Assistance - 24 hour    Equipment Recommendations   (TBD--will further assess)    Recommendations for Other Services       Precautions / Restrictions Precautions Precautions: Fall Restrictions Weight Bearing Restrictions: No      Mobility Bed Mobility            Transfers Overall transfer level: Needs assistance Equipment used: Rolling walker (2 wheeled) Transfers: Sit to/from Stand Sit to Stand: Min assist         General transfer comment: cues for hand placement and LE management.    Balance                                            ADL Overall ADL's : Needs assistance/impaired Eating/Feeding: Independent;Sitting   Grooming: Wash/dry hands;Set up;Sitting   Upper Body Bathing: Set up;Sitting   Lower Body Bathing: Minimal assistance;Sit to/from stand   Upper Body Dressing : Set up;Sitting   Lower Body Dressing: Moderate assistance;Sit to/from stand   Toilet Transfer: Minimal assistance;Ambulation;RW;Comfort height toilet;BSC;Grab bars   Toileting- Clothing Manipulation and Hygiene: Minimal assistance;Sit to/from stand         General ADL Comments: Educated on AE options and demonstrated all AE. Pt will think about AE versus family assist. Practiced transferring on comfort height commode with bar and pt had some difficulty descending to toilet. Pt states his toilet is higher than this one at home. He has a vanity on the right  at home. Practiced rising from comfort height with bar and he did well. Also educated on 3in1 option and use a shower chair. He has a built in seat but it is low and no bar to help up. Educated on where to obtain a shower seat also. feel pt will do ok with higher toilet and vanity at home. He will think about what he would like for the shower for a chair. Will further assess shower transfers.      Vision     Perception     Praxis      Pertinent Vitals/Pain Pain Assessment: 0-10 Pain Score: 1  Pain Location: R hip Pain Descriptors / Indicators: Burning;Discomfort Pain Intervention(s): Premedicated before session     Hand Dominance     Extremity/Trunk Assessment Upper Extremity Assessment Upper Extremity Assessment: Overall WFL for tasks assessed          Communication Communication Communication: No difficulties   Cognition Arousal/Alertness: Awake/alert Behavior During Therapy: WFL for tasks assessed/performed Overall Cognitive Status: Within Functional Limits for tasks assessed                     General Comments       Exercises       Shoulder Instructions      Home Living Family/patient expects to be discharged to:: Private residence Living Arrangements: Spouse/significant other Available Help at Discharge:  Family;Available 24 hours/day Type of Home: House Home Access: Stairs to enter [employer] of Steps: 3 Entrance Stairs-Rails: None Home Layout: Two level;Bed/bath upstairs Alternate Level Stairs-Number of Steps: 5 + 10 Alternate Level Stairs-Rails: Right;Left (% have R rail, landing then 10 have R/L too far apart.) Bathroom Shower/Tub: Occupational psychologist: Handicapped height     Home Equipment: [place] - single point;Crutches   Additional Comments: cane is carved/no tip      Prior Functioning/Environment Level of Independence: Independent             OT Diagnosis: Generalized weakness   OT Problem List:  Decreased strength;Decreased knowledge of use of DME or AE   OT Treatment/Interventions: Self-care/ADL training;Patient/family education;Therapeutic activities;DME and/or AE instruction    OT Goals(Current goals can be found in the care plan section) Acute Rehab OT Goals Patient Stated Goal: return to independence OT Goal Formulation: With patient Time For Goal Achievement: 03/22/14 Potential to Achieve Goals: Good  OT Frequency: Min 2X/week   Barriers to D/C:            Co-evaluation              End of Session Equipment Utilized During Treatment: Rolling walker  Activity Tolerance: Patient tolerated treatment well Patient left: in chair;with call bell/phone within reach;with family/visitor present   Time: 5320-2334 OT Time Calculation (min): 26 min Charges:  OT General Charges $OT Visit: 1 Procedure OT Evaluation $Initial OT Evaluation Tier I: 1 Procedure OT Treatments $Therapeutic Activity: 8-22 mins G-Codes:    Jules Schick  [phone] 03/15/2014, 12:58 PM

## 2014-03-15 NOTE — Care Management Note (Signed)
CARE MANAGEMENT NOTE 03/15/2014  Patient:  James [name], James [name]   Account Number:  [id number]  Date Initiated:  03/15/2014  Documentation initiated by:  Azaleah Usman  Subjective/Objective Assessment:   59 yr old male s/p rt total hip replacement     Action/Plan:   Anticipated DC Date:  03/17/2014   Anticipated DC Plan:  [place]  CM consult      [facility] Choice  HOME HEALTH   Choice offered to / List presented to:  C-3 Spouse   DME arranged  3-N-1  James [name]      DME agency  [place] arranged  [place]   Status of service:  In process, will continue to follow Medicare Important Message given?   (If response is "NO", the following Medicare IM given date fields will be blank) Date Medicare IM given:   Medicare IM given by:   Date Additional Medicare IM given:   Additional Medicare IM given by:    Discharge Disposition:  [place]  Per UR Regulation:    If discussed at Long Length of Stay Meetings, dates discussed:    Comments:  03/15/14 Spoke to patient and spouse about HHPT and equipment needs. Choice offered [facility] chosen.  [place] contacted. DME ( RW/ 3 in 1) ordered. No other home health needs at this time.  Rayburn Ma RN, BSN

## 2014-03-15 NOTE — Progress Notes (Signed)
Subjective: 1 Day Post-Op Procedure(s) (LRB): RIGHT TOTAL HIP ARTHROPLASTY ANTERIOR APPROACH  (Right) Patient reports pain as moderate to severe. Comfortable presently. Tolerating diet.  Objective: Vital signs in last 24 hours: Temp:  [97.4 F (36.3 C)-98.5 F (36.9 C)] 97.8 F (36.6 C) (02/27 0512) Pulse Rate:  [63-90] 71 (02/27 0512) Resp:  [10-18] 16 (02/27 0512) BP: (94-140)/(58-99) 115/73 mmHg (02/27 0512) SpO2:  [94 %-100 %] 96 % (02/27 0512) Weight:  [98.884 kg (218 lb)] 98.884 kg (218 lb) (02/26 1525)  Intake/Output from previous day: 02/26 0701 - 02/27 0700 In: 5771.3 [P.O.:1680; I.V.:4041.3; IV Piggyback:50] Out: 3280 [Urine:2980; Blood:300] Intake/Output this shift:     Recent Labs  03/15/14 0440  HGB 11.7*    Recent Labs  03/15/14 0440  WBC 13.8*  RBC 3.68*  HCT 35.7*  PLT 272    Recent Labs  03/15/14 0440  NA 136  K 3.7  CL 103  CO2 27  BUN 13  CREATININE 0.94  GLUCOSE 119*  CALCIUM 8.8   No results for input(s): LABPT, INR in the last 72 hours.  Right hip: Sensation intact distally Intact pulses distally Dorsiflexion/Plantar flexion intact Incision: dressing C/D/I Compartment soft  Assessment/Plan: 1 Day Post-Op Procedure(s) (LRB): RIGHT TOTAL HIP ARTHROPLASTY ANTERIOR APPROACH  (Right) Up with therapy  CLARK, GILBERT 03/15/2014, 8:29 AM

## 2014-03-15 NOTE — Op Note (Signed)
NAMEMarland Kitchen  QUANTAVIS, OBRYANT NO.:  [id number]  MEDICAL RECORD NO.:  [id number]  LOCATION:  32                         FACILITY:  [facility]  PHYSICIAN:  Lind Guest. Ninfa Linden, M.D.DATE OF BIRTH:  Jun 25, 1955  DATE OF PROCEDURE:  03/14/2014 DATE OF DISCHARGE:                              OPERATIVE REPORT   PREOPERATIVE DIAGNOSIS:  Primary osteoarthritis and degenerative joint disease, right hip.  POSTOPERATIVE DIAGNOSIS:  Primary osteoarthritis and degenerative joint disease, right hip.  PROCEDURE:  Right total hip arthroplasty through direct anterior approach.  IMPLANTS:  DePuy Sector Gription acetabular component size 54, size 36+ 4 neutral polyethylene liner, size 11 Corail femoral component with standard offset, size 36+ 1.5 ceramic hip ball.  SURGEON:  Lind Guest. Ninfa Linden, MD  ASSISTANT:  Erskine Emery, PA-C  ANESTHESIA:  Spinal.  ANTIBIOTICS:  2 g IV Ancef.  BLOOD LOSS:  300 mL.  COMPLICATIONS:  None.  INDICATIONS:  Mr. Gibeault is a very pleasant 59 year old gentleman with known primary osteoarthritis involving his right hip.  He has radiographic evidence of significant loss of the joint space with joint space narrowing, periarticular osteophytes, sclerotic changes as well. He has failed all forms of conservative treatment at this point due to his increased pain, decreased mobility, and decreased quality of life. He does wish to proceed with a total hip arthroplasty.  He understands the risks of acute blood loss anemia, nerve and vessel injury, fracture and fracture dislocation, DVT.  He understands our goals were decreased pain, improved mobility, and overall improved quality of life.  PROCEDURE DESCRIPTION:  After informed consent was obtained and appropriate right hip was marked, he was brought to the operating room and spinal anesthesia was obtained while he was on the stretcher.  A Foley catheter was then placed.  He was laid in a supine  position and traction boots were placed on both of his feet.  Next, he was placed supine on the Hana fracture table with perineal post in place and both legs in inline skeletal traction devices, but no traction applied.  His right operative hip was prepped and draped with DuraPrep and sterile drapes.  A time-out was called and he was identified as the correct patient, correct right hip.  We then made an incision inferior and posterior to the anterior-superior iliac spine and carried this obliquely down the leg.  We dissected down the tensor fascia lata muscle and tensor fascia was then divided longitudinally so we could proceed with a direct anterior approach to the hip.  We identified and cauterized the lateral femoral circumflex vessels and put our Cobra retractors around the lateral neck and up underneath the rectus femoris around the medial femoral neck.  We opened up the hip capsule and found a joint effusion with significant osteophytes and arthritis around his right hip.  Using an oscillating saw, we made our femoral neck cut proximal to the lesser trochanter and completed this with an osteotome. We placed a corkscrew guide in the femoral head and removed the femoral head in its entirety and found to be devoid of cartilage.  I cleaned the acetabular debris including remnants of acetabular labrum.  I placed a bent Hohmann medially and  then began reaming under direct visualization starting with a size 42 reamer and we went up to a size 54 reamer with the last reamer also under direct fluoroscopy so we could obtain our depth of reaming, our inclination, and anteversion.  Once we did this, we placed the real DePuy Sector Gription acetabular component size 54, an apex hole eliminator, and then a 36+ 4 neutral polyethylene liner for size 54 acetabular component.  We then turned attention to the femur. With the leg externally rotated to 100 degrees extended and adducted, we were able to  place a Mueller retractor medially and Hohmann retractor behind the greater trochanter.  I released the lateral joint capsule and used a box cutting osteotome into the femoral canal and a rongeur to lateralize.  We then began broaching from a size 8 broach using the Corail broaching system up to a size 11.  With the size 11 in place, we used a calcar planer and then trialed a standard neck and a 36+ 1.5 trial hip ball.  We rolled the leg back over and up and with traction and internal rotation reducing the pelvis and it was stable.  I was pleased with offset leg lengths also and range of motion.  Under direct fluoroscopy, I was pleased with how the implant looked as well.  We then dislocated the hip and removed the trial components.  We then removed all femoral components with standard offset and the real 36+ 1.5 ceramic hip ball.  We then brought the leg over and up and with traction and internal rotation reducing the pelvis, it was stable again on exam under fluoroscopy with equal offset and leg lengths.  We then copiously irrigated the soft tissues with normal saline solution and closed the joint capsule with interrupted #1 Ethibond suture followed by a running #1 Vicryl in the tensor fascia, 0 Vicryl in the deep tissue, 2-0 Vicryl in the subcutaneous tissue, 4-0 Monocryl subcuticular stitch, and Steri- Strips on the skin, and an Aquacel dressing was also applied.  He was then taken off the Hana table, and taken to the recovery room in stable condition.  All final counts were correct.  There were no complications noted.  Of note, Benita Stabile, PA-C assisted during the entire case, and his assistance was crucial in facilitating all aspects of this case.     Lind Guest. Ninfa Linden, M.D.     CYB/MEDQ  D:  03/14/2014  T:  03/15/2014  Job:  [id number]

## 2014-03-15 NOTE — Evaluation (Signed)
Physical Therapy Evaluation Patient Details Name: James [name] MRN: [id number] DOB: [birth year] Today's Date: 03/15/2014   History of Present Illness   R DATHA  Clinical Impression  Patient is tolerating  Very well. Will try patient with crutches as he has second level. Patient will benefit from PT to address problems listed in  Note below.    Follow Up Recommendations Home health PT;Supervision/Assistance - 24 hour    Equipment Recommendations  Rolling walker with 5" wheels (may be able to get by with crutches as patient is placing most of weight through R leg, will determine after next visit,)    Recommendations for Other Services       Precautions / Restrictions Precautions Precautions: Fall Restrictions Weight Bearing Restrictions: No      Mobility  Bed Mobility Overal bed mobility: Needs Assistance Bed Mobility: Supine to Sit     Supine to sit: Min assist     General bed mobility comments: use of sheet aroun R foot to assist  moving leg to edge, cues on technique  Transfers Overall transfer level: Needs assistance Equipment used: Rolling walker (2 wheeled) Transfers: Sit to/from Stand Sit to Stand: Min assist;From elevated surface         General transfer comment: cues for Hand and R leg psition  Ambulation/Gait Ambulation/Gait assistance: Min assist Ambulation Distance (Feet): 400 Feet Assistive device: Rolling walker (2 wheeled) Gait Pattern/deviations: Step-to pattern;Step-through pattern     General Gait Details: cues for sequence  Stairs            Wheelchair Mobility    Modified Rankin (Stroke Patients Only)       Balance                                             Pertinent Vitals/Pain Pain Assessment: 0-10 Pain Score: 1  Pain Location: R hip Pain Descriptors / Indicators: Burning;Discomfort Pain Intervention(s): Premedicated before session    Home Living Family/patient expects to be discharged to::  Private residence Living Arrangements: Spouse/significant other Available Help at Discharge: Family;Available 24 hours/day Type of Home: House Home Access: Stairs to enter Entrance Stairs-Rails: None Entrance Stairs-Number of Steps: 3 Home Layout: Two level;Bed/bath upstairs Home Equipment: Cane - single point;Crutches Additional Comments: cane is carved/no tip    Prior Function Level of Independence: Independent               Hand Dominance        Extremity/Trunk Assessment               Lower Extremity Assessment: RLE deficits/detail RLE Deficits / Details: active hip flexion supine 60, advances leg       Communication   Communication: No difficulties  Cognition Arousal/Alertness: Awake/alert Behavior During Therapy: WFL for tasks assessed/performed Overall Cognitive Status: Within Functional Limits for tasks assessed                      General Comments      Exercises Total Joint Exercises Ankle Circles/Pumps: AROM;Both;10 reps;Supine Short Arc Quad: AROM;Supine;Right;15 reps Heel Slides: AAROM;Supine;Right;15 reps Hip ABduction/ADduction: AAROM;Supine;Right;15 reps      Assessment/Plan    PT Assessment Patient needs continued PT services  PT Diagnosis Difficulty walking   PT Problem List Decreased strength;Decreased range of motion;Decreased activity tolerance;Decreased mobility;Decreased knowledge of precautions;Decreased safety awareness;Decreased knowledge of use of DME  PT Treatment Interventions DME instruction;Gait training;Stair training;Functional mobility training;Therapeutic activities;Therapeutic exercise;Patient/family education   PT Goals (Current goals can be found in the Care Plan section) Acute Rehab PT Goals Patient Stated Goal: to walk without a limp PT Goal Formulation: With patient Time For Goal Achievement: 03/22/14 Potential to Achieve Goals: Good    Frequency 7X/week   Barriers to discharge         Co-evaluation               End of Session   Activity Tolerance: Patient tolerated treatment well Patient left:  (up in room with OT) Nurse Communication: Mobility status         Time: 2479-9800 PT Time Calculation (min) (ACUTE ONLY): 29 min   Charges:   PT Evaluation $Initial PT Evaluation Tier I: 1 Procedure PT Treatments $Gait Training: 8-22 mins   PT G Codes:        Claretha Cooper 03/15/2014, 11:07 AM

## 2014-03-15 NOTE — Discharge Instructions (Signed)
° °  Pickup stool softener for constipation. Right lower extremity Weight Bearing as tolerated  No right hip precautions Progress activities slowly Expect right hip and possible right knee soreness and swelling. Apply heat or ice as needed. Keep  Right hip dressing clean dry and intact, may shower with dressing intact.

## 2014-03-15 NOTE — Plan of Care (Signed)
Problem: Consults Goal: Diagnosis- Total Joint Replacement Outcome: Completed/Met Date Met:  03/15/14 Primary Total Hip RIGHT, Anterior

## 2014-03-15 NOTE — Progress Notes (Signed)
Physical Therapy Treatment Patient Details Name: James [name] MRN: [id number] DOB: 08-18-1955 Today's Date: 03/15/2014    History of Present Illness  R DATHA    PT Comments    patient reports R thigh is tighter, continues to ambulate well, will practice steps  Tomorrow.  Follow Up Recommendations  Home health PT;Supervision/Assistance - 24 hour     Equipment Recommendations  Rolling walker with 5" wheels    Recommendations for Other Services       Precautions / Restrictions Precautions Precautions: Fall    Mobility  Bed Mobility   Bed Mobility: Sit to Supine       Sit to supine: Min assist   General bed mobility comments: used leg lifter with min assist of therapist for safety.  Transfers Overall transfer level: Needs assistance Equipment used: Rolling walker (2 wheeled) Transfers: Sit to/from Stand Sit to Stand: Min guard         General transfer comment: cues for hand placement and LE management.  Ambulation/Gait Ambulation/Gait assistance: Min guard Ambulation Distance (Feet): 400 Feet Assistive device: Rolling walker (2 wheeled) Gait Pattern/deviations: Step-to pattern;Decreased stance time - right;Decreased step length - right     General Gait Details: cues for sequence   Stairs            Wheelchair Mobility    Modified Rankin (Stroke Patients Only)       Balance                                    Cognition Arousal/Alertness: Awake/alert                          Exercises Total Joint Exercises Ankle Circles/Pumps: AROM;Both;10 reps;Supine Quad Sets: AROM;Both;10 reps;Supine Short Arc Quad: AROM;Supine;Right;15 reps Heel Slides: AAROM;Supine;Right;15 reps Hip ABduction/ADduction: AAROM;Supine;Right;15 reps    General Comments        Pertinent Vitals/Pain Pain Score: 5  Pain Location: R thigh Pain Descriptors / Indicators: Burning;Tightness Pain Intervention(s): Monitored during  session;Premedicated before session;Repositioned    Home Living                      Prior Function            PT Goals (current goals can now be found in the care plan section) Progress towards PT goals: Progressing toward goals    Frequency  7X/week    PT Plan Current plan remains appropriate    Co-evaluation             End of Session   Activity Tolerance: Patient tolerated treatment well Patient left: in bed;with call bell/phone within reach;with family/visitor present     Time: 2563-8937 PT Time Calculation (min) (ACUTE ONLY): 52 min  Charges:  $Gait Training: 8-22 mins $Therapeutic Exercise: 8-22 mins $Self Care/Home Management: Sep 26, 2022                    G Codes:      Claretha Cooper 03/15/2014, 5:05 PM

## 2014-03-16 LAB — CBC
HCT: 34.2 % — ABNORMAL LOW (ref 39.0–52.0)
Hemoglobin: 10.8 g/dL — ABNORMAL LOW (ref 13.0–17.0)
MCH: 31.1 pg (ref 26.0–34.0)
MCHC: 31.6 g/dL (ref 30.0–36.0)
MCV: 98.6 fL (ref 78.0–100.0)
Platelets: 226 10*3/uL (ref 150–400)
RBC: 3.47 MIL/uL — ABNORMAL LOW (ref 4.22–5.81)
RDW: 13 % (ref 11.5–15.5)
WBC: 10.9 10*3/uL — ABNORMAL HIGH (ref 4.0–10.5)

## 2014-03-16 NOTE — Progress Notes (Signed)
Discharged from floor via w/c, family with pt. No changes in assessment. Syanna Remmert, [employer]

## 2014-03-16 NOTE — Discharge Summary (Signed)
Physician Discharge Summary  Patient ID: James [name] MRN: [id number] DOB/AGE: 09/09/1955 59 y.o.  Admit date: 03/14/2014 Discharge date:   Admission Diagnoses:  Principal Problem:   Osteoarthritis of right hip Active Problems:   Status post total replacement of right hip   Discharge Diagnoses:  Same  Past Medical History  Diagnosis Date  . Hiatal hernia   . GERD (gastroesophageal reflux disease)   . Degenerative disc disease   . Hyperlipidemia   . Lower back pain   . Internal hemorrhoids 2013    Colonoscopy  . Diverticulosis 2013    Colonoscopy  . Hx of colonic polyps 2013    Colonoscopy- tubular adenoma, and hyperplastic     Surgeries: Procedure(s): RIGHT TOTAL HIP ARTHROPLASTY ANTERIOR APPROACH  on 03/14/2014   Consultants:    Discharged Condition: Improved  Hospital Course: James [name] is an 59 y.o. male who was admitted 03/14/2014 with a chief complaint of No chief complaint on file. , and found to have a diagnosis of Osteoarthritis of right hip.  They were brought to the operating room on 03/14/2014 and underwent the above named procedures.    They were given perioperative antibiotics:  Anti-infectives    Start     Dose/Rate Route Frequency Ordered Stop   03/14/14 1800  ceFAZolin (ANCEF) IVPB 1 g/50 mL premix     1 g 100 mL/hr over 30 Minutes Intravenous Every 6 hours 03/14/14 1531 03/15/14 0040   03/14/14 1011  ceFAZolin (ANCEF) IVPB 2 g/50 mL premix     2 g 100 mL/hr over 30 Minutes Intravenous On call to O.R. 03/14/14 1011 03/14/14 1157    POD#1 awake, alert right thigh min swelling up with PT, foley discontinued and void without difficulty. POD#2 Afebrile VSS, slight low BP but asymptomatic. Tolerating po meds and nourishment without difficulty. Discharged Home on POD#2.  They were given sequential compression devices, early ambulation, and chemoprophylaxis for DVT prophylaxis.  They benefited maximally from their hospital stay and there were  no complications.    Recent vital signs:  Filed Vitals:   03/16/14 0636  BP: 99/52  Pulse: 67  Temp: 98.1 F (36.7 C)  Resp: 16    Recent laboratory studies:  Results for orders placed or performed during the hospital encounter of 03/14/14  CBC  Result Value Ref Range   WBC 13.8 (H) 4.0 - 10.5 K/uL   RBC 3.68 (L) 4.22 - 5.81 MIL/uL   Hemoglobin 11.7 (L) 13.0 - 17.0 g/dL   HCT 35.7 (L) 39.0 - 52.0 %   MCV 97.0 78.0 - 100.0 fL   MCH 31.8 26.0 - 34.0 pg   MCHC 32.8 30.0 - 36.0 g/dL   RDW 12.7 11.5 - 15.5 %   Platelets 272 150 - 400 K/uL  Basic metabolic panel  Result Value Ref Range   Sodium 136 135 - 145 mmol/L   Potassium 3.7 3.5 - 5.1 mmol/L   Chloride 103 96 - 112 mmol/L   CO2 27 19 - 32 mmol/L   Glucose, Bld 119 (H) 70 - 99 mg/dL   BUN 13 6 - 23 mg/dL   Creatinine, Ser 0.94 0.50 - 1.35 mg/dL   Calcium 8.8 8.4 - 10.5 mg/dL   GFR calc non Af Amer 90 (L) >90 mL/min   GFR calc Af Amer >90 >90 mL/min   Anion gap 6 5 - 15  CBC  Result Value Ref Range   WBC 10.9 (H) 4.0 - 10.5 K/uL   RBC  3.47 (L) 4.22 - 5.81 MIL/uL   Hemoglobin 10.8 (L) 13.0 - 17.0 g/dL   HCT 34.2 (L) 39.0 - 52.0 %   MCV 98.6 78.0 - 100.0 fL   MCH 31.1 26.0 - 34.0 pg   MCHC 31.6 30.0 - 36.0 g/dL   RDW 13.0 11.5 - 15.5 %   Platelets 226 150 - 400 K/uL  Type and screen  Result Value Ref Range   ABO/RH(D) O POS    Antibody Screen NEG    Sample Expiration 03/17/2014   ABO/Rh  Result Value Ref Range   ABO/RH(D) O POS     Discharge Medications:     Medication List    STOP taking these medications        aspirin 325 MG tablet  Replaced by:  aspirin 325 MG EC tablet     ibuprofen 600 MG tablet  Commonly known as:  ADVIL,MOTRIN      TAKE these medications        aspirin 325 MG EC tablet  Take 1 tablet (325 mg total) by mouth 2 (two) times daily after a meal.     azithromycin 250 MG tablet  Commonly known as:  ZITHROMAX  Take 250 mg by mouth daily.     cyclobenzaprine 10 MG tablet   Commonly known as:  FLEXERIL  Take 10 mg by mouth 3 (three) times daily as needed for muscle spasms.     econazole nitrate 1 % cream  Apply topically daily.     glucosamine-chondroitin 500-400 MG tablet  Take 1 tablet by mouth daily.     HAIR/SKIN/NAILS PO  Take 1 tablet by mouth daily.     LYSINE PO  Take 1 tablet by mouth daily.     multivitamin tablet  Take 1 tablet by mouth daily.     oxyCODONE-acetaminophen 5-325 MG per tablet  Commonly known as:  ROXICET  Take 1-2 tablets by mouth every 4 (four) hours as needed for severe pain.     oxymetazoline 0.05 % nasal spray  Commonly known as:  AFRIN  Place 2 sprays into both nostrils at bedtime.     pantoprazole 40 MG tablet  Commonly known as:  PROTONIX  take 1 tablet by mouth once daily     saw palmetto 160 MG capsule  Take 160 mg by mouth daily.        Diagnostic Studies: Dg C-arm 1-60 Min-no Report  03/14/2014   CLINICAL DATA: Right total hip replacement   C-ARM 1-60 MINUTES  Fluoroscopy was utilized by the requesting physician.  No radiographic  interpretation.    Dg Hip Port Unilat With Pelvis 1v Right  03/14/2014   CLINICAL DATA:  Status post right total hip arthroplasty.  EXAM: RIGHT HIP (WITH PELVIS) 1 VIEW PORTABLE  COMPARISON:  None.  FINDINGS: Right total hip arthroplasty device is identified. The hardware components are in anatomic alignment and there are no complications identified. There are no periprosthetic fractures or subluxations. Gas is identified within the surrounding soft tissues.  IMPRESSION: 1. Status post right total hip arthroplasty. No complications noted.   Electronically Signed   By: Kerby Moors M.D.   On: 03/14/2014 14:58   Dg Hip Unilat With Pelvis 2-3 Views Left  03/14/2014   CLINICAL DATA:  Right total hip placement  EXAM: DG C-ARM 1-60 MIN - NRPT [facility]; LEFT HIP (WITH PELVIS) 2-3 VIEWS  COMPARISON:  None  FLUOROSCOPY TIME:  Radiation Exposure Index (as provided by the fluoroscopic  device):  Not provided  If the device does not provide the exposure index:  Fluoroscopy Time:  0 minutes 18 seconds  Number of Acquired Images:  2  FINDINGS: Acetabular and femoral components of a RIGHT hip prosthesis are identified in expected positions.  No acute fracture, dislocation or bone destruction.  Osseous mineralization grossly normal for technique.  IMPRESSION: RIGHT hip prosthesis without acute complication.   Electronically Signed   By: Lavonia Dana M.D.   On: 03/14/2014 13:22    Disposition: Final discharge disposition not confirmed      Discharge Instructions    Call MD / Call 911    Complete by:  As directed   If you experience chest pain or shortness of breath, CALL 911 and be transported to the hospital emergency room.  If you develope a fever above 101 F, pus (white drainage) or increased drainage or redness at the wound, or calf pain, call your surgeon's office.     Constipation Prevention    Complete by:  As directed   Drink plenty of fluids.  Prune juice may be helpful.  You may use a stool softener, such as Colace (over the counter) 100 mg twice a day.  Use MiraLax (over the counter) for constipation as needed.     Diet - low sodium heart healthy    Complete by:  As directed      Discharge wound care:    Complete by:  As directed   Keep dressing clean and intact. May shower with dressing intact.     Driving restrictions    Complete by:  As directed   No driving for 3 weeks     Follow the hip precautions as taught in Physical Therapy    Complete by:  As directed      Increase activity slowly as tolerated    Complete by:  As directed      Lifting restrictions    Complete by:  As directed   No lifting for 6 weeks     Weight bearing as tolerated    Complete by:  As directed   No hip precautions  Laterality:  right  Extremity:  Lower           Follow-up Information    Follow up with Mcarthur Rossetti, MD. Schedule an appointment as soon as possible for a  visit in 2 weeks.   Specialty:  Orthopedic Surgery   Contact information:   [place] [address] [phone]       [facility] up with [facility].   Why:  Home Health Physical Therapy and equipment (RW /3 in 1)   Contact information:   [address] [place] [address] [phone]        Signed: Jessy Oto 03/16/2014, 9:11 AM

## 2014-03-16 NOTE — Plan of Care (Signed)
Problem: Discharge Progression Outcomes Goal: Anticoagulant follow-up in place Outcome: Not Applicable Date Met:  29/04/75 asa

## 2014-03-16 NOTE — Progress Notes (Signed)
Patient ID: James [name], male   DOB: 25-Sep-1955, 59 y.o.   MRN: [id number] Subjective: 2 Days Post-Op Procedure(s) (LRB): RIGHT TOTAL HIP ARTHROPLASTY ANTERIOR APPROACH  (Right) Awake, alert and Oriented x 4. I would like to go home today.  Patient reports pain as mild.    Objective:   VITALS:  Temp:  [98.1 F (36.7 C)-98.8 F (37.1 C)] 98.1 F (36.7 C) (02/28 0636) Pulse Rate:  [67-80] 67 (02/28 0636) Resp:  [16-18] 16 (02/28 0636) BP: (99-118)/(52-77) 99/52 mmHg (02/28 0636) SpO2:  [95 %-96 %] 95 % (02/28 0636)  Neurologically intact ABD soft Neurovascular intact Sensation intact distally Intact pulses distally Dorsiflexion/Plantar flexion intact Incision: no drainage Compartment soft   LABS  Recent Labs  03/15/14 0440 03/16/14 0543  HGB 11.7* 10.8*  WBC 13.8* 10.9*  PLT 272 226    Recent Labs  03/15/14 0440  NA 136  K 3.7  CL 103  CO2 27  BUN 13  CREATININE 0.94  GLUCOSE 119*   No results for input(s): LABPT, INR in the last 72 hours.   Assessment/Plan: 2 Days Post-Op Procedure(s) (LRB): RIGHT TOTAL HIP ARTHROPLASTY ANTERIOR APPROACH  (Right)  Advance diet Up with therapy Discharge home with home health  Oyindamola Key E 03/16/2014, 9:08 AM

## 2014-03-16 NOTE — Progress Notes (Signed)
Physical Therapy Treatment Patient Details Name: James [name] MRN: [id number] DOB: 06-Jul-1955 Today's Date: 03/16/2014    History of Present Illness  R DATHA    PT Comments    All education completed.  Follow Up Recommendations  Home health PT;Supervision/Assistance - 24 hour     Equipment Recommendations       Recommendations for Other Services       Precautions / Restrictions Precautions Precautions: Fall Restrictions Weight Bearing Restrictions: No    Mobility  Bed Mobility Overal bed mobility: Modified Independent Bed Mobility: Supine to Sit       Sit to supine: Min guard   General bed mobility comments: use of sheet around foot to self assist onto bed.  Transfers Overall transfer level: Needs assistance Equipment used: Rolling walker (2 wheeled) Transfers: Sit to/from Stand Sit to Stand: Supervision         General transfer comment: good technique  Ambulation/Gait Ambulation/Gait assistance: Supervision Ambulation Distance (Feet): 300 Feet   Gait Pattern/deviations: Step-through pattern;Decreased step length - right     General Gait Details: ambulated x 600' with crutches   Stairs Stairs: Yes Stairs assistance: Min assist Stair Management: Step to pattern;Forwards;With crutches;One rail Right Number of Stairs: 4 General stair comments: 4 with 2 crutches, 4 with crutch and rail.  Wheelchair Mobility    Modified Rankin (Stroke Patients Only)       Balance                                    Cognition Arousal/Alertness: Awake/alert Behavior During Therapy: WFL for tasks assessed/performed Overall Cognitive Status: Within Functional Limits for tasks assessed                      Exercises Total Joint Exercises Ankle Circles/Pumps: AROM;Both;10 reps;Supine Quad Sets: AROM;Both;10 reps;Supine Short Arc Quad: AROM;Supine;Right;15 reps Heel Slides: Supine;Right;15 reps;AROM Hip ABduction/ADduction:  Supine;Right;15 reps;AROM    General Comments        Pertinent Vitals/Pain Pain Assessment: Faces Faces Pain Scale: Hurts a little bit Pain Location: R thigh Pain Descriptors / Indicators: Burning Pain Intervention(s): Monitored during session;Patient requesting pain meds-RN notified;Ice applied    Home Living                      Prior Function            PT Goals (current goals can now be found in the care plan section) Acute Rehab PT Goals Patient Stated Goal: return to independence Progress towards PT goals: Progressing toward goals    Frequency       PT Plan Current plan remains appropriate    Co-evaluation             End of Session   Activity Tolerance: Patient tolerated treatment well Patient left: in bed;with call bell/phone within reach;with family/visitor present     Time: 1134-1200 PT Time Calculation (min) (ACUTE ONLY): 26 min  Charges:  $Gait Training: 8-22 mins $Therapeutic Exercise: 8-22 mins                    G Codes:      Claretha Cooper 03/16/2014, 1:19 PM

## 2014-03-16 NOTE — Progress Notes (Signed)
Occupational Therapy Treatment Patient Details Name: James [name] MRN: [id number] DOB: 1955-12-01 Today's Date: 03/16/2014    History of present illness  R DATHA   OT comments  Pt performing standing grooming and toileting with supervision.  3 in 1 in room.  Educated pt in multiple uses of 3 in 1.   Follow Up Recommendations  No OT follow up;Supervision/Assistance - 24 hour    Equipment Recommendations  3 in 1 bedside comode    Recommendations for Other Services      Precautions / Restrictions Precautions Precautions: Fall Restrictions Weight Bearing Restrictions: No       Mobility Bed Mobility Overal bed mobility: Modified Independent Bed Mobility: Supine to Sit           General bed mobility comments: used hands to assist R LE OOB, required pt to get up from flat bed without use of rail.    Transfers Overall transfer level: Needs assistance Equipment used: Rolling walker (2 wheeled) Transfers: Sit to/from Stand Sit to Stand: Supervision         General transfer comment: good technique    Balance                                   ADL Overall ADL's : Needs assistance/impaired     Grooming: Wash/dry hands;Sitting;Supervision/safety;Oral care           Upper Body Dressing : Set up;Standing       Toilet Transfer: Supervision/safety;Ambulation;RW Toilet Transfer Details (indicate cue type and reason): instructed to straddle toilet with RW for safety Toileting- Clothing Manipulation and Hygiene: Supervision/safety;Sit to/from stand       Functional mobility during ADLs: Supervision/safety;Rolling walker General ADL Comments: Pt has a bath brush on a handle for reaching his R foot.  Able to doff SCDs independently, but not yet able to reach socks, will rely on wife.  Instructed to attempt to reach foot daily and to dress L LE first, then R.       Vision                     Perception     Praxis      Cognition    Behavior During Therapy: WFL for tasks assessed/performed Overall Cognitive Status: Within Functional Limits for tasks assessed                       Extremity/Trunk Assessment               Exercises     Shoulder Instructions       General Comments      Pertinent Vitals/ Pain       Pain Assessment: Faces Faces Pain Scale: Hurts a little bit Pain Location: R incision Pain Descriptors / Indicators: Burning Pain Intervention(s): Monitored during session;Repositioned;Ice applied  Home Living                                          Prior Functioning/Environment              Frequency Min 2X/week     Progress Toward Goals  OT Goals(current goals can now be found in the care plan section)  Progress towards OT goals: Progressing toward goals  Acute Rehab OT Goals Patient Stated Goal:  return to independence  Plan Discharge plan remains appropriate    Co-evaluation                 End of Session Equipment Utilized During Treatment: Rolling walker   Activity Tolerance Patient tolerated treatment well   Patient Left in chair;with call bell/phone within reach;with family/visitor present   Nurse Communication          Time: 3643-8377 OT Time Calculation (min): 29 min  Charges: OT General Charges $OT Visit: 1 Procedure OT Treatments $Self Care/Home Management : 23-37 mins  Malka So 03/16/2014, 10:53 AM  [phone]

## 2014-03-17 ENCOUNTER — Encounter (HOSPITAL_COMMUNITY): Payer: Self-pay | Admitting: Orthopaedic Surgery

## 2014-04-13 ENCOUNTER — Other Ambulatory Visit: Payer: Self-pay | Admitting: Internal Medicine

## 2014-04-16 ENCOUNTER — Telehealth: Payer: Self-pay | Admitting: *Deleted

## 2014-04-16 DIAGNOSIS — G4730 Sleep apnea, unspecified: Secondary | ICD-10-CM

## 2014-04-16 NOTE — Telephone Encounter (Signed)
Patient referred to Dr Gwenette Greet for consult and treat sleep apnea

## 2014-11-04 ENCOUNTER — Telehealth: Payer: Self-pay | Admitting: Internal Medicine

## 2014-11-04 MED ORDER — PANTOPRAZOLE SODIUM 40 MG PO TBEC: 40.0000 mg | DELAYED_RELEASE_TABLET | Freq: Every day | ORAL | Status: DC

## 2014-11-04 NOTE — Telephone Encounter (Signed)
Protonix escribed.  Patient aware.

## 2014-11-04 NOTE — Telephone Encounter (Signed)
In [place] visiting their daughter and forgot to take his Protonix with them.  Wants to know if you will call in enough for him to have for today through Sunday.    Pharmacy:    [facility] [address]

## 2015-01-06 ENCOUNTER — Other Ambulatory Visit: Payer: Commercial Managed Care - HMO | Admitting: Internal Medicine

## 2015-01-06 DIAGNOSIS — Z125 Encounter for screening for malignant neoplasm of prostate: Secondary | ICD-10-CM

## 2015-01-06 DIAGNOSIS — E785 Hyperlipidemia, unspecified: Secondary | ICD-10-CM

## 2015-01-06 DIAGNOSIS — Z0000 Encounter for general adult medical examination without abnormal findings: Secondary | ICD-10-CM

## 2015-01-06 DIAGNOSIS — Z130 Encounter for screening for diseases of the blood and blood-forming organs and certain disorders involving the immune mechanism: Secondary | ICD-10-CM

## 2015-01-06 LAB — CBC WITH DIFFERENTIAL/PLATELET
Basophils Absolute: 0 10*3/uL (ref 0.0–0.1)
Basophils Relative: 0 % (ref 0–1)
Eosinophils Absolute: 0.2 10*3/uL (ref 0.0–0.7)
Eosinophils Relative: 3 % (ref 0–5)
HCT: 41.1 % (ref 39.0–52.0)
Hemoglobin: 14 g/dL (ref 13.0–17.0)
Lymphocytes Relative: 32 % (ref 12–46)
Lymphs Abs: 1.8 10*3/uL (ref 0.7–4.0)
MCH: 32.3 pg (ref 26.0–34.0)
MCHC: 34.1 g/dL (ref 30.0–36.0)
MCV: 94.9 fL (ref 78.0–100.0)
MPV: 11.3 fL (ref 8.6–12.4)
Monocytes Absolute: 0.6 10*3/uL (ref 0.1–1.0)
Monocytes Relative: 11 % (ref 3–12)
Neutro Abs: 3.1 10*3/uL (ref 1.7–7.7)
Neutrophils Relative %: 54 % (ref 43–77)
Platelets: 379 10*3/uL (ref 150–400)
RBC: 4.33 MIL/uL (ref 4.22–5.81)
RDW: 13.6 % (ref 11.5–15.5)
WBC: 5.7 10*3/uL (ref 4.0–10.5)

## 2015-01-06 LAB — COMPLETE METABOLIC PANEL WITH GFR
ALT: 13 U/L (ref 9–46)
AST: 21 U/L (ref 10–35)
Albumin: 4.6 g/dL (ref 3.6–5.1)
Alkaline Phosphatase: 44 U/L (ref 40–115)
BUN: 11 mg/dL (ref 7–25)
CO2: 26 mmol/L (ref 20–31)
Calcium: 9.9 mg/dL (ref 8.6–10.3)
Chloride: 100 mmol/L (ref 98–110)
Creat: 0.86 mg/dL (ref 0.70–1.25)
GFR, Est African American: >89 mL/min (ref >=60)
GFR, Est Non African American: >89 mL/min (ref >=60)
Glucose, Bld: 89 mg/dL (ref 65–99)
Potassium: 4.8 mmol/L (ref 3.5–5.3)
Sodium: 136 mmol/L (ref 135–146)
Total Bilirubin: 0.5 mg/dL (ref 0.2–1.2)
Total Protein: 7.2 g/dL (ref 6.1–8.1)

## 2015-01-06 LAB — LIPID PANEL
Cholesterol: 241 mg/dL — ABNORMAL HIGH (ref 125–200)
HDL: 100 mg/dL (ref >=40)
LDL Cholesterol: 129 mg/dL (ref <130)
Total CHOL/HDL Ratio: 2.4 Ratio (ref <=5.0)
Triglycerides: 59 mg/dL (ref <150)
VLDL: 12 mg/dL (ref <30)

## 2015-01-07 LAB — PSA: PSA: 1.6 ng/mL (ref <=4.00)

## 2015-01-08 ENCOUNTER — Ambulatory Visit (INDEPENDENT_AMBULATORY_CARE_PROVIDER_SITE_OTHER): Payer: Commercial Managed Care - HMO | Admitting: Internal Medicine

## 2015-01-08 ENCOUNTER — Encounter: Payer: Self-pay | Admitting: Internal Medicine

## 2015-01-08 VITALS — BP 118/76 | HR 76 | Temp 98.1°F | Resp 20 | Ht 72.0 in | Wt 205.0 lb

## 2015-01-08 DIAGNOSIS — Z23 Encounter for immunization: Secondary | ICD-10-CM | POA: Diagnosis not present

## 2015-01-08 DIAGNOSIS — Z9660 Presence of unspecified orthopedic joint implant: Secondary | ICD-10-CM | POA: Diagnosis not present

## 2015-01-08 DIAGNOSIS — R7989 Other specified abnormal findings of blood chemistry: Secondary | ICD-10-CM

## 2015-01-08 DIAGNOSIS — J069 Acute upper respiratory infection, unspecified: Secondary | ICD-10-CM

## 2015-01-08 DIAGNOSIS — Z96641 Presence of right artificial hip joint: Secondary | ICD-10-CM

## 2015-01-08 DIAGNOSIS — E7800 Pure hypercholesterolemia, unspecified: Secondary | ICD-10-CM | POA: Diagnosis not present

## 2015-01-08 MED ORDER — CLARITHROMYCIN 500 MG PO TABS: 500.0000 mg | ORAL_TABLET | Freq: Two times a day (BID) | ORAL | Status: DC

## 2015-01-08 MED ORDER — HYDROCODONE-HOMATROPINE 5-1.5 MG/5ML PO SYRP: 5.0000 mL | ORAL_SOLUTION | Freq: Three times a day (TID) | ORAL | Status: DC | PRN

## 2015-01-08 NOTE — Patient Instructions (Signed)
Continue diet and exercise. Biaxin 500 mg twice daily for 10 days. Hycodan as needed for cough.

## 2015-01-14 NOTE — Progress Notes (Signed)
   Subjective:    Patient ID: James [name], male    DOB: October 18, 1955, 59 y.o.   MRN: [id number]  HPI He was initially scheduled today for physical exam but his physical exam is not really due until February 2017. This past Spring he had right hip replacement surgery and has done extremely well. No new complaints or problems except for recent respiratory infection. Wife has had similar illness. He has a history of hyperlipidemia. Total cholesterol is 241. LDL cholesterol is 129 but his HDL cholesterol is 100. HDL cholesterol has increased substantially since last measurement last year but he is now able to exercise much better after hip replacement.  History of smoking but he quit prior to his hip replacement surgery and has not restarted.    Review of Systems     Objective:   Physical Exam  Skin warm and dry. Nodes none. Pharynx slightly injected. TMs are slightly full bilaterally but not red. Neck is supple. Chest clear to auscultation. Sounds nasally congested when he speaks.      Assessment & Plan:  Elevated LDL cholesterol  High HDL cholesterol  Acute URI  Status post right hip replacement surgery  History of smoking-quit spring 2016  History of GE reflux  Plan: Biaxin 500 mg twice daily for 10 days. Hycodan 1 teaspoon by mouth every 8 hours when necessary cough. Return late spring for physical exam.

## 2015-05-04 ENCOUNTER — Other Ambulatory Visit: Payer: Self-pay

## 2015-05-04 MED ORDER — PANTOPRAZOLE SODIUM 40 MG PO TBEC: 40.0000 mg | DELAYED_RELEASE_TABLET | Freq: Every day | ORAL | Status: DC

## 2015-06-30 ENCOUNTER — Other Ambulatory Visit: Payer: Commercial Managed Care - HMO | Admitting: Internal Medicine

## 2015-06-30 DIAGNOSIS — Z13220 Encounter for screening for lipoid disorders: Secondary | ICD-10-CM

## 2015-06-30 DIAGNOSIS — Z0000 Encounter for general adult medical examination without abnormal findings: Secondary | ICD-10-CM

## 2015-06-30 DIAGNOSIS — Z125 Encounter for screening for malignant neoplasm of prostate: Secondary | ICD-10-CM

## 2015-06-30 LAB — LIPID PANEL
Cholesterol: 262 mg/dL — ABNORMAL HIGH (ref 125–200)
HDL: 92 mg/dL (ref >=40)
LDL Cholesterol: 150 mg/dL — ABNORMAL HIGH (ref <130)
Total CHOL/HDL Ratio: 2.8 Ratio (ref <=5.0)
Triglycerides: 100 mg/dL (ref <150)
VLDL: 20 mg/dL (ref <30)

## 2015-06-30 LAB — CBC WITH DIFFERENTIAL/PLATELET
Basophils Absolute: 0 cells/uL (ref 0–200)
Basophils Relative: 0 %
Eosinophils Absolute: 120 cells/uL (ref 15–500)
Eosinophils Relative: 2 %
HCT: 40.9 % (ref 38.5–50.0)
Hemoglobin: 13.5 g/dL (ref 13.2–17.1)
Lymphocytes Relative: 34 %
Lymphs Abs: 2040 cells/uL (ref 850–3900)
MCH: 31.4 pg (ref 27.0–33.0)
MCHC: 33 g/dL (ref 32.0–36.0)
MCV: 95.1 fL (ref 80.0–100.0)
MPV: 11.7 fL (ref 7.5–12.5)
Monocytes Absolute: 720 cells/uL (ref 200–950)
Monocytes Relative: 12 %
Neutro Abs: 3120 cells/uL (ref 1500–7800)
Neutrophils Relative %: 52 %
Platelets: 321 10*3/uL (ref 140–400)
RBC: 4.3 MIL/uL (ref 4.20–5.80)
RDW: 13.2 % (ref 11.0–15.0)
WBC: 6 10*3/uL (ref 3.8–10.8)

## 2015-06-30 LAB — COMPLETE METABOLIC PANEL WITH GFR
ALT: 11 U/L (ref 9–46)
AST: 21 U/L (ref 10–35)
Albumin: 4.4 g/dL (ref 3.6–5.1)
Alkaline Phosphatase: 48 U/L (ref 40–115)
BUN: 15 mg/dL (ref 7–25)
CO2: 25 mmol/L (ref 20–31)
Calcium: 9.8 mg/dL (ref 8.6–10.3)
Chloride: 103 mmol/L (ref 98–110)
Creat: 0.98 mg/dL (ref 0.70–1.25)
GFR, Est African American: >89 mL/min (ref >=60)
GFR, Est Non African American: 83 mL/min (ref >=60)
Glucose, Bld: 87 mg/dL (ref 65–99)
Potassium: 4.8 mmol/L (ref 3.5–5.3)
Sodium: 139 mmol/L (ref 135–146)
Total Bilirubin: 0.7 mg/dL (ref 0.2–1.2)
Total Protein: 7.1 g/dL (ref 6.1–8.1)

## 2015-07-01 LAB — PSA: PSA: 1.31 ng/mL (ref <=4.00)

## 2015-07-02 ENCOUNTER — Ambulatory Visit (INDEPENDENT_AMBULATORY_CARE_PROVIDER_SITE_OTHER): Payer: Commercial Managed Care - HMO | Admitting: Internal Medicine

## 2015-07-02 ENCOUNTER — Encounter: Payer: Self-pay | Admitting: Internal Medicine

## 2015-07-02 VITALS — BP 130/80 | HR 66 | Temp 97.4°F | Resp 18 | Ht 72.0 in | Wt 205.0 lb

## 2015-07-02 DIAGNOSIS — K625 Hemorrhage of anus and rectum: Secondary | ICD-10-CM

## 2015-07-02 DIAGNOSIS — E7800 Pure hypercholesterolemia, unspecified: Secondary | ICD-10-CM | POA: Diagnosis not present

## 2015-07-02 DIAGNOSIS — G5711 Meralgia paresthetica, right lower limb: Secondary | ICD-10-CM

## 2015-07-02 DIAGNOSIS — R7989 Other specified abnormal findings of blood chemistry: Secondary | ICD-10-CM

## 2015-07-02 DIAGNOSIS — M545 Low back pain, unspecified: Secondary | ICD-10-CM

## 2015-07-02 DIAGNOSIS — G4730 Sleep apnea, unspecified: Secondary | ICD-10-CM

## 2015-07-02 DIAGNOSIS — K219 Gastro-esophageal reflux disease without esophagitis: Secondary | ICD-10-CM

## 2015-07-02 DIAGNOSIS — Z0000 Encounter for general adult medical examination without abnormal findings: Secondary | ICD-10-CM | POA: Diagnosis not present

## 2015-07-02 NOTE — Progress Notes (Signed)
Subjective:    Patient ID: James [name], male    DOB: 1955/05/08, 60 y.o.   MRN: [id number]  HPI 60 year old White Male in today for health maintenance exam and evaluation of medical issues. He has several issues to discuss today. In 2013 he had a colonoscopy. He had a small focus of colitis and one tubular adenoma. He never went back for follow-up. He has a history of internal hemorrhoids and has rectal bleeding from time to time associated with constipation. ProctoCream-HC was prescribed at the time which he does not use. He needs to return to Gastroenterology for follow-up and we will make that appointment.  He has a history of snoring and possible sleep apnea. We will make referral for sleep study and evaluation. It was noted when he had hip replacement that he might have sleep apnea.  He has some numbness down the right lateral leg which I think is meralgia paresthetica. He's noticed this since his right hip replacement. Sometimes his right hip is uncomfortable. He will return to Dr. Ninfa Linden for evaluation.  He has a history of low back pain. No numbness in the lower extremities and no weakness in the lower extremities. He saw Dr. Joya Salm many years ago and was told that he had degenerative disease in his spine. He does not want to take Mobic. Occasionally takes over-the-counter NSAIDS.  He declines to be tested for Hepatitis C. His tetanus immunization is up-to-date. He says he had Zostavax vaccine at [facility] last year.  He has a benign  cyst on his right palm.  His knuckle pops sometimes when he extends his finger.  He has lost 14 pounds since February 2016.  History of GE reflux treated with PP5  Remote history of smoking. He quit around the time of his hip replacement surgery in 2016. Prior to that smoked a pack of cigarettes daily for over 25 years.  No known drug allergies.  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  In 2013 he had a  screening colonoscopy. He had to serated adenomatous, several hyperplastic polyps, he also had a submucosal granular tumor and some segmental sigmoid colitis. Polyps were not high-grade.  Fractured left forearm 1962, fractured right ankle 1971, left inguinal herniorrhaphy 1995.  Tetanus immunization done April 2013.  Family history: Father died at 43 with history of coronary artery disease, esophageal and colon cancer. Mother deceased with history of stroke and history of colon cancer. 2 sisters in good health.  Social history: He is married, completed 2 years of college. 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    Review of Systems as above. Also has meralgia paresthetica right lateral thigh. Has bilateral low back pain without radiculopathy which has bothered him for a number of years.     Objective:   Physical Exam  Constitutional: He is oriented to person, place, and time. He appears well-developed and well-nourished.  HENT:  Head: Normocephalic and atraumatic.  Right Ear: External ear normal.  Left Ear: External ear normal.  Mouth/Throat: Oropharynx is clear and moist. No oropharyngeal exudate.  Eyes: Conjunctivae are normal. Pupils are equal, round, and reactive to light. Right eye exhibits no discharge. Left eye exhibits no discharge. No scleral icterus.  Neck: Neck supple. No JVD present. No thyromegaly present.  Cardiovascular: Normal rate, regular rhythm, normal heart sounds and intact distal pulses.   No murmur heard. Pulmonary/Chest: Effort normal and breath sounds normal. No respiratory distress. He has no  wheezes. He has no rales. He exhibits no tenderness.  Abdominal: Soft. Bowel sounds are normal. He exhibits no distension and no mass. There is no tenderness. There is no rebound and no guarding.  Genitourinary: Rectum normal and prostate normal.  No stool black. No masses in rectum   Musculoskeletal: He exhibits no edema.  Lymphadenopathy:    He has no  cervical adenopathy.  Neurological: He is alert and oriented to person, place, and time. He has normal reflexes. No cranial nerve deficit. Coordination normal.  Skin: Skin is warm and dry. No rash noted. He is not diaphoretic.  Psychiatric: He has a normal mood and affect. His behavior is normal. Judgment and thought content normal.  Vitals reviewed.         Assessment & Plan:  Rectal bleeding-refer to gastroenterology  Sleep apnea suspected-refer to pulmonologist  Low back pain  Meralgia paresthetica  History of smoking  History of right hip replacement now with recurrent right hip pain-to see Dr. Ninfa Linden  History of hyperlipidemia needs to diet and exercise  History of GE reflux

## 2015-07-02 NOTE — Patient Instructions (Signed)
Appointment with gastroenterologist for follow-up of rectal bleeding and other concerns. Appointment regarding possible sleep apnea. Patient will return to orthopedist for evaluation. He is to follow a low-fat diet and try to exercise. Return in 6 months for lipid panel and office visit.

## 2015-08-27 ENCOUNTER — Encounter: Payer: Self-pay | Admitting: *Deleted

## 2015-09-01 ENCOUNTER — Encounter: Payer: Self-pay | Admitting: Internal Medicine

## 2015-09-01 ENCOUNTER — Ambulatory Visit (INDEPENDENT_AMBULATORY_CARE_PROVIDER_SITE_OTHER): Payer: Commercial Managed Care - HMO | Admitting: Internal Medicine

## 2015-09-01 VITALS — BP 112/76 | HR 80 | Temp 97.8°F | Ht 72.0 in | Wt 204.5 lb

## 2015-09-01 DIAGNOSIS — J069 Acute upper respiratory infection, unspecified: Secondary | ICD-10-CM | POA: Diagnosis not present

## 2015-09-01 MED ORDER — HYDROCODONE-HOMATROPINE 5-1.5 MG/5ML PO SYRP: 5.0000 mL | ORAL_SOLUTION | Freq: Three times a day (TID) | ORAL | 0 refills | Status: DC | PRN

## 2015-09-01 MED ORDER — CLARITHROMYCIN 500 MG PO TABS: 500.0000 mg | ORAL_TABLET | Freq: Two times a day (BID) | ORAL | 1 refills | Status: DC

## 2015-09-01 NOTE — Patient Instructions (Signed)
Hycodan 1 teaspoon by mouth every 8 hours when necessary cough. Biaxin 500 mg twice daily for 10 days with one refill.

## 2015-09-01 NOTE — Progress Notes (Signed)
   Subjective:    Patient ID: James [name], male    DOB: January 29, 1955, 60 y.o.   MRN: [id number]  HPI  60 year old male in today with URI symptoms for several weeks. Says he came down with it initially around the time his wife was ill with similar illness in mid July. Has continued to have cough and congestion and can't seem to get well.    Review of Systems as above. No fever or shaking chills. Some sputum production. Slight sore throat.     Objective:   Physical Exam Skin warm and dry. Nodes none. TMs are clear. Pharynx very slightly injected without exudate. Neck is supple without adenopathy. Chest clear to auscultation without rales or wheezing.       Assessment & Plan:  Acute URI  Plan: Biaxin 500 mg twice daily for 10 days with one refill. If not better in 10 days have prescription refilled. Hycodan 1 teaspoon by mouth every 8 hours when necessary cough.

## 2015-09-08 ENCOUNTER — Ambulatory Visit (INDEPENDENT_AMBULATORY_CARE_PROVIDER_SITE_OTHER): Payer: Commercial Managed Care - HMO | Admitting: Internal Medicine

## 2015-09-08 ENCOUNTER — Encounter: Payer: Self-pay | Admitting: Internal Medicine

## 2015-09-08 VITALS — BP 128/90 | HR 68 | Ht 72.0 in | Wt 203.4 lb

## 2015-09-08 DIAGNOSIS — Z800 Family history of malignant neoplasm of digestive organs: Secondary | ICD-10-CM

## 2015-09-08 DIAGNOSIS — K625 Hemorrhage of anus and rectum: Secondary | ICD-10-CM | POA: Diagnosis not present

## 2015-09-08 DIAGNOSIS — K219 Gastro-esophageal reflux disease without esophagitis: Secondary | ICD-10-CM

## 2015-09-08 DIAGNOSIS — Z8719 Personal history of other diseases of the digestive system: Secondary | ICD-10-CM

## 2015-09-08 DIAGNOSIS — Z86010 Personal history of colonic polyps: Secondary | ICD-10-CM

## 2015-09-08 MED ORDER — NA SULFATE-K SULFATE-MG SULF 17.5-3.13-1.6 GM/177ML PO SOLN: ORAL | 0 refills | Status: DC

## 2015-09-08 NOTE — Progress Notes (Signed)
Subjective:    Patient ID: James [name], male    DOB: 03/20/1955, 60 y.o.   MRN: [id number]  HPI Robley Matassa is a 60 year old male with a past medical history of adenomatous colon polyps, internal hemorrhoids, diverticulosis, segmental colitis, GERD and hiatal hernia who is here for follow-up. He was last seen in the office in August 2013 after which a repeat colonoscopy was recommended 6 months from the last, around November 2013, but he was lost to follow-up. I inquired about this and he states he didn't really want to have it done. On colonoscopy performed on 06/16/2011 he had a sessile cecal polyp, 2 transverse colon polyps, colitis in the sigmoid, pedunculated sigmoid polyp removed and tattooed, and 6 polyps in the rectosigmoid colon removed. Moderate internal hemorrhoids and sigmoid diverticulosis. 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 Transverse polyps sessile serrated polyp. Sigmoid biopsy minute focus of acute colitis. Proximal sigmoid polyp tubular adenoma without high-grade dysplasia. Rectosigmoid polyps hyperplastic. He was seen in the office after this colonoscopy and in fact was not having any symptoms attributable to colitis.  Today he returns stating that he has had recurrent rectal bleeding painless in nature. This is associated with having bowel movements. He does have a tendency for constipation but he added Kale to his diet and this has improved his constipation. He does report seeing clots of blood with bowel movements on occasion. He denies abdominal pain. He denies tenesmus. He's having a bowel movement every morning and sometimes one more throughout the day.  He takes pantoprazole 40 mg daily for reflux disease and states that his heartburn is well controlled. He denies dysphagia or odynophagia. Reports stable weight, good appetite. Denies fever or chills.  Blood work was done in June 2017 which was reviewed today. CMP was within normal limits. Hemoglobin normal at 13.5,  MCV 95.1. Normal platelet and white count. PSA was normal.   Review of Systems As per history of present illness, otherwise negative  Current Medications, Allergies, Past Medical History, Past Surgical History, Family History and Social History were reviewed in [employer] record.     Objective:   Physical Exam BP 128/90 (BP Location: Left Arm, Patient Position: Sitting, Cuff Size: Normal)   Pulse 68   Ht 6' (1.829 m)   Wt 203 lb 6.4 oz (92.3 kg)   BMI 27.59 kg/m  Constitutional: Well-developed and well-nourished. No distress. HEENT: Normocephalic and atraumatic. Oropharynx is clear and moist. No oropharyngeal exudate. Conjunctivae are normal.  No scleral icterus. Neck: Neck supple. Trachea midline. Cardiovascular: Normal rate, regular rhythm and intact distal pulses. No M/R/G Pulmonary/chest: Effort normal and breath sounds normal. No wheezing, rales or rhonchi. Abdominal: Soft, nontender, nondistended. Bowel sounds active throughout. There are no masses palpable. No hepatosplenomegaly. Extremities: no clubbing, cyanosis, or edema Lymphadenopathy: No cervical adenopathy noted. Neurological: Alert and oriented to person place and time. Skin: Skin is warm and dry. No rashes noted. Psychiatric: Normal mood and affect. Behavior is normal.     Assessment & Plan:  60 year old male with a past medical history of adenomatous colon polyps, internal hemorrhoids, diverticulosis, segmental colitis, GERD and hiatal hernia who is here for follow-up.  1. Hx of adenomatous colon polyps/rectal bleeding -- I recommended repeat colonoscopy for adenoma surveillance but also to evaluate rectal bleeding and history of colitis, felt most likely to be associated with diverticulosis previously. He is not currently having abdominal pain or diarrhea making colitis less likely. Bleeding could be hemorrhoidal, and  if so he would be a candidate for hemorrhoidal banding in the future. We  discussed this today. We discussed the risks, benefits and alternatives to repeat colonoscopy and he wishes to proceed.  2. GERD -- well-controlled on pantoprazole. Continue pantoprazole 40 mg daily. His father had a history of esophageal cancer and for this reason I recommended upper endoscopy for Barrett's screening.

## 2015-09-08 NOTE — Patient Instructions (Signed)
You have been scheduled for a colonoscopy. Please follow written instructions given to you at your visit today.  Please pick up your prep supplies at the pharmacy within the next 1-3 days. If you use inhalers (even only as needed), please bring them with you on the day of your procedure. Your physician has requested that you go to www.startemmi.com and enter the access code given to you at your visit today. This web site gives a general overview about your procedure. However, you should still follow specific instructions given to you by our office regarding your preparation for the procedure.  If you are age 60 or older, your body mass index should be between 23-30. Your Body mass index is 27.59 kg/m. If this is out of the aforementioned range listed, please consider follow up with your Primary Care Provider.  If you are age 50 or younger, your body mass index should be between 19-25. Your Body mass index is 27.59 kg/m. If this is out of the aformentioned range listed, please consider follow up with your Primary Care Provider.

## 2015-09-09 ENCOUNTER — Telehealth: Payer: Self-pay | Admitting: *Deleted

## 2015-09-09 DIAGNOSIS — K219 Gastro-esophageal reflux disease without esophagitis: Secondary | ICD-10-CM

## 2015-09-09 NOTE — Telephone Encounter (Signed)
-----   Message from Jerene Bears, MD sent at 09/08/2015  6:03 PM EDT ----- I meant to recommend EGD in addition to colonoscopy given his hx of GERD and family history of esophageal cancer See if he would be willing to add this on to his colonoscopy Thanks

## 2015-09-09 NOTE — Telephone Encounter (Signed)
Left voicemail for patient to call back. 

## 2015-09-10 NOTE — Telephone Encounter (Signed)
Patient states that he is willing to have the endoscopy done at the same time as colonoscopy. I have added this procedure on. He also asks for updated instructions as he previously changed the date he is having procedure. New instructions printed and mailed to patient.

## 2015-09-17 ENCOUNTER — Encounter: Payer: Commercial Managed Care - HMO | Admitting: Internal Medicine

## 2015-09-23 ENCOUNTER — Ambulatory Visit (INDEPENDENT_AMBULATORY_CARE_PROVIDER_SITE_OTHER): Payer: Commercial Managed Care - HMO | Admitting: Pulmonary Disease

## 2015-09-23 ENCOUNTER — Encounter: Payer: Self-pay | Admitting: Pulmonary Disease

## 2015-09-23 DIAGNOSIS — G4710 Hypersomnia, unspecified: Secondary | ICD-10-CM | POA: Diagnosis not present

## 2015-09-23 DIAGNOSIS — G4730 Sleep apnea, unspecified: Secondary | ICD-10-CM | POA: Diagnosis not present

## 2015-09-23 MED ORDER — AZELASTINE-FLUTICASONE 137-50 MCG/ACT NA SUSP: 2.0000 | Freq: Every day | NASAL | 0 refills | Status: DC

## 2015-09-23 NOTE — Progress Notes (Signed)
Subjective:    Patient ID: James [name], male    DOB: [birth year], 60 y.o.   MRN: [id number]   James [name] is a 60 year old former 44 pack year smoker ( quit 2016) male here for sleep consultation with Dr. Corrie Dandy. He has a history significant for allergic rhinitis, GERD, and hiatal hernia.  HPI Pt. Presents for consultation for OSA at the request of  Dr. Renold Genta. He states he does not have good sleep hygeine, He has a varying bedtime and varying wake up time.It takes anywhere from 10 minutes to 1 hour to go to sleep. He states that his wife has witnessed episode of apnea while sleeping. He snores very loudly per his family.Marland KitchenHe states he can remember even as a child dreaming of waking up gasping for air. He has had a weight change, he has lost 13 pounds over the last 2 years. He has never had a sleep study done; he has never worn oxygen or used a CPAP machine. He had hip replacement surgery 02/2014 and noted he really liked the oxygen he received while in the hospital at night. He has daytime sleepiness, and has for years. 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 He has nasal stuffiness, and is Afrin dependent 2 puffs per nare nightly.He states he cannot sleep without it.He is currently being treated for a cold with Hycodan syrup for cough, which has been helping him sleep. The cold is improving over time.Marland Kitchen He denies fever or chest pain, orthopnea or hemoptysis.  Past Medical History:  Diagnosis Date  . Colitis   . Degenerative disc disease   . Diverticulosis 2013   Colonoscopy  . GERD (gastroesophageal reflux disease)   . Hiatal hernia   . Hx of colonic polyps 2013   Colonoscopy- tubular adenoma, and hyperplastic   . Hyperlipidemia   . Internal hemorrhoids 2013   Colonoscopy  . Lower back pain   . Tubular adenoma     Family History  Problem Relation Age of Onset  . Heart disease Father   . Colon cancer Father 60  . Esophageal cancer Father     late 7's  . Asthma  Daughter   . Colon cancer Mother   . Stomach cancer Neg Hx     Past Surgical History:  Procedure Laterality Date  . COLONOSCOPY    . HERNIA REPAIR  1980   left inguinal  . TOTAL HIP ARTHROPLASTY Right 03/14/2014   Procedure: RIGHT TOTAL HIP ARTHROPLASTY ANTERIOR APPROACH ;  Surgeon: Mcarthur Rossetti, MD;  Location: [facility];  Service: Orthopedics;  Laterality: Right;    Review of Systems  Constitutional: Negative.  Negative for fever and unexpected weight change.  HENT: Positive for congestion and sinus pressure. Negative for dental problem, ear pain, nosebleeds, postnasal drip, rhinorrhea, sneezing, sore throat and trouble swallowing.   Eyes: Negative.  Negative for redness and itching.  Respiratory: Negative.  Negative for cough, chest tightness, shortness of breath and wheezing.   Cardiovascular: Negative.  Negative for palpitations and leg swelling.  Gastrointestinal: Negative.  Negative for nausea and vomiting.  Endocrine: Negative.   Genitourinary: Negative.  Negative for dysuria.  Musculoskeletal: Negative.  Negative for joint swelling.  Skin: Negative.  Negative for rash.  Allergic/Immunologic: Positive for environmental allergies.  Neurological: Negative.  Negative for headaches.  Hematological: Negative.  Does not bruise/bleed easily.  Psychiatric/Behavioral: Negative.  Negative for dysphoric mood. The patient is not nervous/anxious.  Objective:   Physical Exam   BP 122/88 (BP Location: Left Arm, Cuff Size: Normal)   Pulse 66   Ht 6' (1.829 m)   Wt 206 lb (93.4 kg)   SpO2 95%   BMI 27.94 kg/m   Physical Exam:  General- No distress,  A&Ox3, overweight ENT: No sinus tenderness, TM clear, pale nasal mucosa, no oral exudate,no post nasal drip, no LAN Cardiac: S1, S2, regular rate and rhythm, no murmur Chest: No wheeze/ rales/ dullness; no accessory muscle use, no nasal flaring, no sternal retractions Abd.: Soft Non-tender Ext: No clubbing cyanosis,  edema Neuro:  normal strength Skin: No rashes, warm and dry Psych: normal mood and behavior      Assessment & Plan:   Hypersomnolence with snoring and witnessed apnea  Plan: We will order a home sleep study today to evaluate you for sleep apnea. We  will call you with results, and order a CPAP machine through your insurance company if the study indicates you have sleep apnea.. Please try to start weaning yourself off the Afrin each night at bedtime. Start Dymista 2 puffs each nare at bedtime instead for nasal stuffiness Follow up with Dr. Corrie Dandy or NP 4 -6 weeks after starting CPAP treatment ( if study indicates a need) Please contact office for sooner follow up if symptoms do not improve or worsen or seek emergency care   Magdalen Spatz, AGACNP-BC [place] Pager # [phone] 09/23/2015   We appreciate the opportunity to assist with Mr. Summer' care. Thank you for the referral.  ATTENDING NOTE / ATTESTATION NOTE :   I have discussed the case with the resident/APP  Eric Form.   I agree with the resident/APP's  history, physical examination, assessment, and plans.    I have edited the above note and modified it according to our agreed history, physical examination, assessment and plan.   Briefly, patient being seen for hypersomnia, snoring, gasping, choking, witnessed apneas. Hypersomnia affects his functionality. He operates heavy equipment. Generally healthy except for sinus issues for which he is on Afrin daily. Physical examination unremarkable. Plan for home sleep study. He anticipates, it will be hard for him to sleep in a lab. As mentioned, his sleep schedule is all over the place. If sleep study is negative, need to call up the patient. If positive sleep study, we'll try auto CPAP. Anticipate will not have issues with also CPAP. May end up needing full face mask. He has chronic sinus issues. Advised to switched Afrin to Dymista.   Return  to clinic in 6-8 weeks.       Monica Becton, MD 09/23/2015, 4:55 PM [facility] Pager [phone] After 3 pm or if no answer, call [phone]

## 2015-09-23 NOTE — Patient Instructions (Addendum)
It is nice to meet you today. We will order a home sleep study today to evaluate you for sleep apnea. We  will call you with results, and order a CPAP machine through your insurance company if the study indicates you have sleep apnea.. Please try to start weaning yourself off the afrin each night at bedtime. Start Dymista 2 puffs each nare at bedtime instead. Follow up with Dr. Corrie Dandy or NP 4 -6 weeks after starting CPAP treatment ( if study indicates a need) Please contact office for sooner follow up if symptoms do not improve or worsen or seek emergency care

## 2015-09-23 NOTE — Assessment & Plan Note (Signed)
We will order a home sleep study today to evaluate you for sleep apnea. We  will call you with results, and order a CPAP machine through your insurance company if the study indicates you have sleep apnea.. Please try to start weaning yourself off the afrin each night at bedtime. Start Dymista 2 puffs each nare at bedtime instead. Follow up with Dr. Corrie Dandy or NP 4 -6 weeks after starting CPAP treatment ( if study indicates a need) Please contact office for sooner follow up if symptoms do not improve or worsen or seek emergency care

## 2015-09-24 ENCOUNTER — Encounter: Payer: Self-pay | Admitting: Internal Medicine

## 2015-09-28 ENCOUNTER — Telehealth: Payer: Self-pay | Admitting: Pulmonary Disease

## 2015-09-28 NOTE — Telephone Encounter (Signed)
LMTCB

## 2015-09-29 NOTE — Telephone Encounter (Signed)
Pt already has an order for home sleep test in epic. Please advise PCC's thanks

## 2015-09-29 NOTE — Telephone Encounter (Signed)
Spoke with pt. States that Dymista is not working well. At night time he is still having lots of nasal congestion that makes it hard to sleep. Pt states that he thinks using a CPAP machine would help with this.  AD - please advise. Thanks.

## 2015-09-29 NOTE — Telephone Encounter (Signed)
Spoke with pt and advised that PCC's should call him next week and schedule HST. Pt voiced understanding. Nothing further needed.

## 2015-09-29 NOTE — Telephone Encounter (Signed)
We were just given the order on 09/23/15 let him know he will probably get a call next week thanks Joellen Jersey

## 2015-09-29 NOTE — Telephone Encounter (Signed)
Spoke to pt re: nasal congestion as he has been off afrin and now on dymista.    Plan : 1. Needs HST. If (+), try cpap and hopefully nasal congestion will be better.  2. If nasal congestion is not better on cpap, recommend ENT follow.  3. Told pt to decide whether he wants afrin or dymista for nasal congestion.   Monica Becton, MD 09/29/2015, 2:13 PM [facility] Pager [phone] After 3 pm or if no answer, call [phone]

## 2015-10-02 ENCOUNTER — Telehealth: Payer: Self-pay | Admitting: Internal Medicine

## 2015-10-02 ENCOUNTER — Telehealth: Payer: Self-pay | Admitting: Pulmonary Disease

## 2015-10-02 MED ORDER — AZELASTINE-FLUTICASONE 137-50 MCG/ACT NA SUSP: 2.0000 | Freq: Every day | NASAL | 0 refills | Status: DC

## 2015-10-02 NOTE — Telephone Encounter (Signed)
Spoke with pt and is aware 1 sample left for pick up. Nothing further needed

## 2015-10-05 NOTE — Telephone Encounter (Signed)
No charge Given symptoms and history I strongly recommend he reschedule colonoscopy as soon as possible for him

## 2015-10-06 ENCOUNTER — Encounter: Payer: Commercial Managed Care - HMO | Admitting: Internal Medicine

## 2015-10-07 DIAGNOSIS — G4733 Obstructive sleep apnea (adult) (pediatric): Secondary | ICD-10-CM | POA: Diagnosis not present

## 2015-10-08 ENCOUNTER — Telehealth: Payer: Self-pay | Admitting: Pulmonary Disease

## 2015-10-08 DIAGNOSIS — G4733 Obstructive sleep apnea (adult) (pediatric): Secondary | ICD-10-CM

## 2015-10-08 NOTE — Telephone Encounter (Signed)
  Please call the pt and tell the pt the [place]  showed OSA  Pt stops breathing  48  times an hour.   Home sleep study was done on : 10/07/15  Please order autoCPAP 5-15 cm H2O. Patient will need a mask fitting session. Patient will need a 1 month download.   Patient needs to be seen by me or any of the NPs/APPs  4-6 weeks after obtaining the cpap machine. Let me know if you receive this.   Thanks!   J. Shirl Harris, MD 10/08/2015, 1:21 PM

## 2015-10-09 ENCOUNTER — Other Ambulatory Visit: Payer: Self-pay | Admitting: *Deleted

## 2015-10-09 DIAGNOSIS — G4730 Sleep apnea, unspecified: Principal | ICD-10-CM

## 2015-10-09 DIAGNOSIS — G4710 Hypersomnia, unspecified: Secondary | ICD-10-CM

## 2015-10-12 NOTE — Telephone Encounter (Signed)
Spoke with pt and gave results and recommendations. Pt agrees to start CPAP therapy. Order placed. Pt aware to call office and schedule f/u appt once starts CPAP. Nothing further needed.  

## 2015-10-12 NOTE — Addendum Note (Signed)
Addended by: Beckie Busing on: 10/12/2015 05:03 PM   Modules accepted: Orders

## 2015-10-23 ENCOUNTER — Telehealth: Payer: Self-pay | Admitting: Pulmonary Disease

## 2015-10-23 MED ORDER — AZELASTINE-FLUTICASONE 137-50 MCG/ACT NA SUSP: 2.0000 | Freq: Every day | NASAL | 6 refills | Status: DC

## 2015-10-23 MED ORDER — AZELASTINE-FLUTICASONE 137-50 MCG/ACT NA SUSP: 2.0000 | Freq: Every day | NASAL | 0 refills | Status: DC

## 2015-10-23 NOTE — Telephone Encounter (Signed)
Called and spoke with pt and he is aware of refill of dymista that has been sent to the pharmacy.  Sample has been left up front and the pt is aware.

## 2015-10-23 NOTE — Telephone Encounter (Signed)
Pt called and stated that the rx for the dymista will need to be sent to his pharmacy ( and this is correct in his chart)  and pt will need a sample of the dymista.

## 2015-11-25 ENCOUNTER — Ambulatory Visit (INDEPENDENT_AMBULATORY_CARE_PROVIDER_SITE_OTHER): Payer: Commercial Managed Care - HMO | Admitting: Physician Assistant

## 2015-12-02 ENCOUNTER — Ambulatory Visit: Payer: Commercial Managed Care - HMO | Admitting: Pulmonary Disease

## 2015-12-17 ENCOUNTER — Ambulatory Visit (INDEPENDENT_AMBULATORY_CARE_PROVIDER_SITE_OTHER): Payer: Commercial Managed Care - HMO | Admitting: Internal Medicine

## 2015-12-17 ENCOUNTER — Encounter: Payer: Self-pay | Admitting: Internal Medicine

## 2015-12-17 VITALS — BP 128/78 | HR 82 | Temp 98.1°F | Wt 210.0 lb

## 2015-12-17 DIAGNOSIS — J069 Acute upper respiratory infection, unspecified: Secondary | ICD-10-CM

## 2015-12-17 MED ORDER — HYDROCODONE-HOMATROPINE 5-1.5 MG/5ML PO SYRP: 5.0000 mL | ORAL_SOLUTION | Freq: Three times a day (TID) | ORAL | 0 refills | Status: DC | PRN

## 2015-12-17 MED ORDER — CLARITHROMYCIN 500 MG PO TABS: 500.0000 mg | ORAL_TABLET | Freq: Two times a day (BID) | ORAL | 1 refills | Status: DC

## 2015-12-17 NOTE — Patient Instructions (Signed)
Biaxin 500 mg by mouth twice daily for 10 days. Hycodan 1 teaspoon by mouth every 8-12 hours when necessary cough. Rest and drink plenty of fluids.

## 2015-12-17 NOTE — Progress Notes (Signed)
   Subjective:    Patient ID: James [name], male    DOB: [birth year], 60 y.o.   MRN: [id number]  HPI  He and his  wife just got back from trip to [place]. They both came down with respiratory infections while there. Wife had some Biaxin. They both started on it, but are  running out of it. He has been taking Biaxin for 2 days. They were also sharing a bottle of Hycodan cough syrup.    Review of Systems see above     Objective:   Physical Exam Skin warm and dry. Nodes none. Pharynx very slightly injected. TMs slightly full bilaterally but not really red. Neck is supple. Sounds nasally congested. Chest clear to auscultation       Assessment & Plan:  Acute URI  Recent out of country travel  Plan: Biaxin 500 mg twice daily for 10 days. Hycodan 1 teaspoon by mouth every 812 hours when necessary cough. Rest and drink plenty of fluids.  He'll come back in a later date for flu vaccine

## 2015-12-21 ENCOUNTER — Encounter: Payer: Self-pay | Admitting: Pulmonary Disease

## 2015-12-23 ENCOUNTER — Encounter: Payer: Self-pay | Admitting: Pulmonary Disease

## 2015-12-23 ENCOUNTER — Ambulatory Visit (INDEPENDENT_AMBULATORY_CARE_PROVIDER_SITE_OTHER): Payer: Commercial Managed Care - HMO | Admitting: Pulmonary Disease

## 2015-12-23 DIAGNOSIS — R0600 Dyspnea, unspecified: Secondary | ICD-10-CM

## 2015-12-23 DIAGNOSIS — R0609 Other forms of dyspnea: Secondary | ICD-10-CM | POA: Insufficient documentation

## 2015-12-23 DIAGNOSIS — J301 Allergic rhinitis due to pollen: Secondary | ICD-10-CM | POA: Diagnosis not present

## 2015-12-23 DIAGNOSIS — G4733 Obstructive sleep apnea (adult) (pediatric): Secondary | ICD-10-CM | POA: Insufficient documentation

## 2015-12-23 NOTE — Assessment & Plan Note (Signed)
12 PY smoking, SOB with more than ADLs. Not too bad. May have copd, if ever, mild. Pt wants to hold off on chest x-ray and PFT.

## 2015-12-23 NOTE — Assessment & Plan Note (Signed)
On Afrin daily. I tried to convince him to wean it off because of rebound congestion. He tried but he got worse with congestion. Flonase did not help.

## 2015-12-23 NOTE — Progress Notes (Signed)
Subjective:    Patient ID: James [name], male    DOB: 04-Feb-1955, 60 y.o.   MRN: [id number]   James [name] is a 60 year old former 44 pack year smoker ( quit 2016) male here for sleep consultation with Dr. Corrie Dandy. He has a history significant for allergic rhinitis, GERD, and hiatal hernia.  HPI Pt. Presents for consultation for OSA at the request of  Dr. Renold Genta. He states he does not have good sleep hygeine, He has a varying bedtime and varying wake up time.It takes anywhere from 10 minutes to 1 hour to go to sleep. He states that his wife has witnessed episode of apnea while sleeping. He snores very loudly per his family.Marland KitchenHe states he can remember even as a child dreaming of waking up gasping for air. He has had a weight change, he has lost 13 pounds over the last 2 years. He has never had a sleep study done; he has never worn oxygen or used a CPAP machine. He had hip replacement surgery 02/2014 and noted he really liked the oxygen he received while in the hospital at night. He has daytime sleepiness, and has for years. 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 He has nasal stuffiness, and is Afrin dependent 2 puffs per nare nightly.He states he cannot sleep without it.He is currently being treated for a cold with Hycodan syrup for cough, which has been helping him sleep. The cold is improving over time.Marland Kitchen He denies fever or chest pain, orthopnea or hemoptysis.  ROV  12/23/2015 Patient returns to the office follow up with sleep apnea. Since last seen, he had a home sleep test done in September which showed AHI of 48. He was started on auto CPAP 5-15 centimeters water. Feels better using CPAP. More energy. Less download the last month: 77%, AHI 4. He cannot sleep without it. He tried weaning off Afrin but made him have  worse congestion.   Review of Systems  Constitutional: Negative.  Negative for fever and unexpected weight change.  HENT: Positive for congestion and sinus pressure.  Negative for dental problem, ear pain, nosebleeds, postnasal drip, rhinorrhea, sneezing, sore throat and trouble swallowing.   Eyes: Negative.  Negative for redness and itching.  Respiratory: Negative.  Negative for cough, chest tightness, shortness of breath and wheezing.   Cardiovascular: Negative.  Negative for palpitations and leg swelling.  Gastrointestinal: Negative.  Negative for nausea and vomiting.  Endocrine: Negative.   Genitourinary: Negative.  Negative for dysuria.  Musculoskeletal: Negative.  Negative for joint swelling.  Skin: Negative.  Negative for rash.  Allergic/Immunologic: Positive for environmental allergies.  Neurological: Negative.  Negative for headaches.  Hematological: Negative.  Does not bruise/bleed easily.  Psychiatric/Behavioral: Negative.  Negative for dysphoric mood. The patient is not nervous/anxious.        Objective:   Physical Exam   BP 122/76 (BP Location: Left Arm, Patient Position: Sitting, Cuff Size: Normal)   Pulse 75   Ht 6' (1.829 m)   Wt 205 lb 9.6 oz (93.3 kg)   SpO2 98%   BMI 27.88 kg/m   Physical Exam:  General- No distress,  A&Ox3, overweight ENT: No sinus tenderness, TM clear, pale nasal mucosa, no oral exudate,no post nasal drip, no LAN Cardiac: S1, S2, regular rate and rhythm, no murmur Chest: No wheeze/ rales/ dullness; no accessory muscle use, no nasal flaring, no sternal retractions Abd.: Soft Non-tender Ext: No clubbing cyanosis, edema Neuro:  normal strength Skin:  No rashes, warm and dry Psych: normal mood and behavior      Assessment & Plan:   OSA (obstructive sleep apnea) Patient very symptomatic with hypersomnia. Healthy otherwise. Has chronic sinus issues for which she is on Afrin chronically.  HST (09/2015)  AHI 48.  On autoCPAP 5-15 centimeters water. Feels better using it. More energy. Less sleepiness. Good download and compliance. He has nasal pillows. He may have issues using full face mask. His chronic  nasal congestion but is not worse with nasal pillows and Afrin.   Plan :  We extensively discussed the importance of treating OSA and the need to use PAP therapy.   Continue with autocpap 5-15 cm water. Has nasal issues/congestion but currently on afrin and uses nasal pillows. He tried to wean off the Afrin but he had worse congestion. He tried Flonase but did not help.   Patient was instructed to have mask, tubings, filter, reservoir cleaned at least once a week with soapy water.  Patient was instructed to call the office if he/she is having issues with the PAP device.    I advised patient to obtain sufficient amount of sleep --  7 to 8 hours at least in a 24 hr period.  Patient was advised to follow good sleep hygiene.  Patient was advised NOT to engage in activities requiring concentration and/or vigilance if he/she is and  sleepy.  Patient is NOT to drive if he/she is sleepy.    Allergic rhinitis On Afrin daily. I tried to convince him to wean it off because of rebound congestion. He tried but he got worse with congestion. Flonase did not help.  Exertional dyspnea 12 PY smoking, SOB with more than ADLs. Not too bad. May have copd, if ever, mild. Pt wants to hold off on chest x-ray and PFT.  Return to clinic in 1 yr.        Monica Becton, MD 12/23/2015, 3:23 PM [facility] Pager [phone] After 3 pm or if no answer, call [phone]

## 2015-12-23 NOTE — Assessment & Plan Note (Signed)
Patient very symptomatic with hypersomnia. Healthy otherwise. Has chronic sinus issues for which she is on Afrin chronically.  HST (09/2015)  AHI 48.  On autoCPAP 5-15 centimeters water. Feels better using it. More energy. Less sleepiness. Good download and compliance. He has nasal pillows. He may have issues using full face mask. His chronic nasal congestion but is not worse with nasal pillows and Afrin.   Plan :  We extensively discussed the importance of treating OSA and the need to use PAP therapy.   Continue with autocpap 5-15 cm water. Has nasal issues/congestion but currently on afrin and uses nasal pillows. He tried to wean off the Afrin but he had worse congestion. He tried Flonase but did not help.   Patient was instructed to have mask, tubings, filter, reservoir cleaned at least once a week with soapy water.  Patient was instructed to call the office if he/she is having issues with the PAP device.    I advised patient to obtain sufficient amount of sleep --  7 to 8 hours at least in a 24 hr period.  Patient was advised to follow good sleep hygiene.  Patient was advised NOT to engage in activities requiring concentration and/or vigilance if he/she is and  sleepy.  Patient is NOT to drive if he/she is sleepy.

## 2015-12-23 NOTE — Patient Instructions (Signed)
  It was a pleasure taking care of you today!  Continue using your CPAP machine.   Please make sure you use your CPAP device everytime you sleep.  We will monitor the usage of your machine per your insurance requirement.  Your insurance company may take the machine from you if you are not using it regularly.   Please clean the mask, tubings, filter, water reservoir with soapy water every week.  Please use distilled water for the water reservoir.   Please call the office or your machine provider (DME company) if you are having issues with the device.   Return to clinic in 1 year  with Dr. Corrie Dandy

## 2016-01-04 ENCOUNTER — Ambulatory Visit (INDEPENDENT_AMBULATORY_CARE_PROVIDER_SITE_OTHER): Payer: Commercial Managed Care - HMO | Admitting: Internal Medicine

## 2016-01-04 DIAGNOSIS — Z23 Encounter for immunization: Secondary | ICD-10-CM | POA: Diagnosis not present

## 2016-01-04 NOTE — Progress Notes (Signed)
Flu vaccine given.

## 2016-01-15 ENCOUNTER — Ambulatory Visit: Payer: Commercial Managed Care - HMO | Admitting: Pulmonary Disease

## 2016-01-18 HISTORY — PX: KNEE ARTHROSCOPY: SUR90

## 2016-01-22 DIAGNOSIS — G4733 Obstructive sleep apnea (adult) (pediatric): Secondary | ICD-10-CM | POA: Diagnosis not present

## 2016-01-26 ENCOUNTER — Other Ambulatory Visit: Payer: Commercial Managed Care - HMO | Admitting: Internal Medicine

## 2016-01-28 ENCOUNTER — Ambulatory Visit: Payer: Commercial Managed Care - HMO | Admitting: Internal Medicine

## 2016-02-11 ENCOUNTER — Other Ambulatory Visit: Payer: Self-pay | Admitting: Internal Medicine

## 2016-02-11 DIAGNOSIS — R062 Wheezing: Secondary | ICD-10-CM

## 2016-02-22 DIAGNOSIS — G4733 Obstructive sleep apnea (adult) (pediatric): Secondary | ICD-10-CM | POA: Diagnosis not present

## 2016-03-21 DIAGNOSIS — G4733 Obstructive sleep apnea (adult) (pediatric): Secondary | ICD-10-CM | POA: Diagnosis not present

## 2016-04-07 DIAGNOSIS — G4733 Obstructive sleep apnea (adult) (pediatric): Secondary | ICD-10-CM | POA: Diagnosis not present

## 2016-04-21 DIAGNOSIS — G4733 Obstructive sleep apnea (adult) (pediatric): Secondary | ICD-10-CM | POA: Diagnosis not present

## 2016-05-05 ENCOUNTER — Other Ambulatory Visit: Payer: Self-pay

## 2016-05-05 MED ORDER — PANTOPRAZOLE SODIUM 40 MG PO TBEC: 40.0000 mg | DELAYED_RELEASE_TABLET | Freq: Every day | ORAL | 11 refills | Status: DC

## 2016-05-10 ENCOUNTER — Telehealth: Payer: Self-pay | Admitting: Internal Medicine

## 2016-05-10 ENCOUNTER — Encounter: Payer: Self-pay | Admitting: Internal Medicine

## 2016-05-10 NOTE — Telephone Encounter (Signed)
Pt is scheduled 06/20/16 to discuss banding. He is scheduled later in June for [facility]. Pt was wanting to go ahead and schedule hem banding but wants to know how long he should wait after ECL. Please advise.

## 2016-05-10 NOTE — Telephone Encounter (Signed)
About 1 month after colonoscopy should be fine

## 2016-05-11 ENCOUNTER — Encounter: Payer: Self-pay | Admitting: Internal Medicine

## 2016-05-11 ENCOUNTER — Telehealth: Payer: Self-pay | Admitting: Internal Medicine

## 2016-05-11 NOTE — Telephone Encounter (Signed)
Patient called and said he has had diarrhea for 2 weeks. He has had some recent antibiotic treatment. He does eat old food in the refrigerator. No recent travel history.  Patient will come by and pick up a stool sample collection kit for C. difficile toxin OandP enteric pathogens. He has appointment soon to see Dr. Hilarie Fredrickson for colonoscopy. He has had rectal bleeding and what sounds like it times uncontrollable loose stools.  He'll try Imodium 2 tablets by mouth 1 time only and clear liquids after he collects stool sample.

## 2016-05-12 ENCOUNTER — Other Ambulatory Visit: Payer: Commercial Managed Care - HMO | Admitting: Internal Medicine

## 2016-05-12 DIAGNOSIS — R197 Diarrhea, unspecified: Secondary | ICD-10-CM

## 2016-05-12 NOTE — Telephone Encounter (Signed)
Left message for pt letting him know to wait about a month after his ECL to schedule banding.

## 2016-05-12 NOTE — Telephone Encounter (Signed)
Left message for pt to call back  °

## 2016-05-12 NOTE — Progress Notes (Signed)
Stool submitted for multiple studies with history of diarrhea

## 2016-05-13 LAB — CLOSTRIDIUM DIFFICILE BY PCR: Toxigenic C. Difficile by PCR: NOT DETECTED

## 2016-05-13 LAB — OVA AND PARASITE EXAMINATION: OP: NONE SEEN

## 2016-05-13 LAB — STOOL CELLS, WBC & RBC
RBC/40X FIELD:: 100
WBC/40X FIELD:: 100

## 2016-05-13 LAB — FECAL LACTOFERRIN, QUANT: Lactoferrin: POSITIVE

## 2016-05-16 ENCOUNTER — Encounter: Payer: Self-pay | Admitting: Internal Medicine

## 2016-05-16 LAB — STOOL CULTURE

## 2016-05-16 NOTE — Telephone Encounter (Signed)
Erroneous encounter

## 2016-05-19 ENCOUNTER — Ambulatory Visit (INDEPENDENT_AMBULATORY_CARE_PROVIDER_SITE_OTHER): Payer: Commercial Managed Care - HMO | Admitting: Physician Assistant

## 2016-05-19 ENCOUNTER — Other Ambulatory Visit (INDEPENDENT_AMBULATORY_CARE_PROVIDER_SITE_OTHER): Payer: Commercial Managed Care - HMO

## 2016-05-19 ENCOUNTER — Encounter: Payer: Self-pay | Admitting: Physician Assistant

## 2016-05-19 VITALS — BP 120/70 | HR 68 | Ht 72.0 in | Wt 199.2 lb

## 2016-05-19 DIAGNOSIS — K625 Hemorrhage of anus and rectum: Secondary | ICD-10-CM

## 2016-05-19 DIAGNOSIS — K219 Gastro-esophageal reflux disease without esophagitis: Secondary | ICD-10-CM | POA: Diagnosis not present

## 2016-05-19 DIAGNOSIS — K50111 Crohn's disease of large intestine with rectal bleeding: Secondary | ICD-10-CM | POA: Diagnosis not present

## 2016-05-19 DIAGNOSIS — R197 Diarrhea, unspecified: Secondary | ICD-10-CM

## 2016-05-19 DIAGNOSIS — Z1211 Encounter for screening for malignant neoplasm of colon: Secondary | ICD-10-CM | POA: Diagnosis not present

## 2016-05-19 DIAGNOSIS — Z86010 Personal history of colonic polyps: Secondary | ICD-10-CM

## 2016-05-19 LAB — CBC WITH DIFFERENTIAL/PLATELET
Basophils Absolute: 0.1 10*3/uL (ref 0.0–0.1)
Basophils Relative: 1 % (ref 0.0–3.0)
Eosinophils Absolute: 0.7 10*3/uL (ref 0.0–0.7)
Eosinophils Relative: 7.1 % — ABNORMAL HIGH (ref 0.0–5.0)
HCT: 32.6 % — ABNORMAL LOW (ref 39.0–52.0)
Hemoglobin: 10.9 g/dL — ABNORMAL LOW (ref 13.0–17.0)
Lymphocytes Relative: 20.6 % (ref 12.0–46.0)
Lymphs Abs: 2.1 10*3/uL (ref 0.7–4.0)
MCHC: 33.6 g/dL (ref 30.0–36.0)
MCV: 93.3 fl (ref 78.0–100.0)
Monocytes Absolute: 1.2 10*3/uL — ABNORMAL HIGH (ref 0.1–1.0)
Monocytes Relative: 12 % (ref 3.0–12.0)
Neutro Abs: 6 10*3/uL (ref 1.4–7.7)
Neutrophils Relative %: 59.3 % (ref 43.0–77.0)
Platelets: 393 10*3/uL (ref 150.0–400.0)
RBC: 3.49 Mil/uL — ABNORMAL LOW (ref 4.22–5.81)
RDW: 14.1 % (ref 11.5–15.5)
WBC: 10.1 10*3/uL (ref 4.0–10.5)

## 2016-05-19 LAB — SEDIMENTATION RATE: Sed Rate: 46 mm/hr — ABNORMAL HIGH (ref 0–20)

## 2016-05-19 LAB — HIGH SENSITIVITY CRP: CRP, High Sensitivity: 12.23 mg/L — ABNORMAL HIGH (ref 0.000–5.000)

## 2016-05-19 MED ORDER — PANTOPRAZOLE SODIUM 40 MG PO TBEC: 40.0000 mg | DELAYED_RELEASE_TABLET | Freq: Every day | ORAL | 11 refills | Status: DC

## 2016-05-19 NOTE — Progress Notes (Addendum)
Subjective:    Patient ID: James [name], male    DOB: August 17, 1955, 61 y.o.   MRN: [id number]  HPI Shahid is a pleasant 61 year old white male known to Dr. Hilarie Fredrickson. He has history of osteoarthritis status post right hip replacement, sleep apnea and hyperlipidemia. He is also been followed for chronic GERD and is maintained on Protonix. He has no current complaints of dysphagia or odynophagia. He has not had prior EGD. Patient does have history of multiple adenomatous colon polyps with last colonoscopy done in May 2013. He had 10 polyps at that time including a pedunculated sigmoid polyp which was removed and tattooed, there was evidence of colitis in the sigmoid colon and moderate internal hemorrhoids as well as sigmoid diverticulosis. 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 Transverse polyp sessile serrated polyp. At the sigmoid biopsy showed a focus of acute colitis, the proximal sigmoid polyp was a tubular adenoma without high-grade dysplasia in the rectosigmoid polyps were hyperplastic. He was seen in the office in August 2017, over due for follow-up colonoscopy and scheduled EGD and colon for September 2017. Unfortunately patient says that he canceled the procedures because his insurance was not going to cover. He comes in today after having a one-month history of very frequent small volume bowel movements with blood and mucus. He says is having up to 10 bowel movements a day but passing just small squirts of stool and blood. He has a sense of incomplete evacuation and frequent urgency and gas. He denies any abdominal pain or rectal pain. Has not had any fever or chills. When questioned carefully again it does not sound as if he 69 and a significant volume of diarrhea but rather small-volume mushy stool with blood and mucus. He has not been on any new medications, no antibiotics etc. He does continue on Protonix for reflux symptoms .  Family history positive for father with esophageal  CA.  Review of Systems Pertinent positive and negative review of systems were noted in the above HPI section.  All other review of systems was otherwise negative.  Outpatient Encounter Prescriptions as of 05/19/2016  Medication Sig  . Multiple Vitamin (MULTIVITAMIN) tablet Take 1 tablet by mouth daily.  Marland Kitchen oxymetazoline (AFRIN 12 HOUR) 0.05 % nasal spray Place 2 sprays into both nostrils at bedtime.  . pantoprazole (PROTONIX) 40 MG tablet Take 1 tablet (40 mg total) by mouth daily.  . [DISCONTINUED] pantoprazole (PROTONIX) 40 MG tablet Take 1 tablet (40 mg total) by mouth daily.  . [DISCONTINUED] Azelastine-Fluticasone 137-50 MCG/ACT SUSP Place 2 puffs into the nose at bedtime.  . [DISCONTINUED] clarithromycin (BIAXIN) 500 MG tablet Take 1 tablet (500 mg total) by mouth 2 (two) times daily.  . [DISCONTINUED] HYDROcodone-homatropine (HYCODAN) 5-1.5 MG/5ML syrup Take 5 mLs by mouth every 8 (eight) hours as needed for cough. (Patient not taking: Reported on 12/23/2015)  . [DISCONTINUED] Na Sulfate-K Sulfate-Mg Sulf 17.5-3.13-1.6 GM/180ML SOLN Suprep-Use as directed (Patient not taking: Reported on 12/23/2015)   No facility-administered encounter medications on file as of 05/19/2016.    No Known Allergies Patient Active Problem List   Diagnosis Date Noted  . OSA (obstructive sleep apnea) 12/23/2015  . Exertional dyspnea 12/23/2015  . 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 Status post total replacement of right hip 03/14/2014  . Osteoarthritis of right hip 03/04/2014  . Allergic rhinitis 03/04/2014  . History of adenomatous polyp of colon 08/19/2011  . 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 GE reflux 11/14/2010  .  Low back pain 11/14/2010  . History of smoking 11/14/2010  . Hyperlipidemia 11/14/2010   Social History   Social History  . Marital status: Married    Spouse name: N/A  . Number of children: N/A  . Years of education: N/A   Occupational History  . Not on file.   Social  History Main Topics  . Smoking status: Former Smoker    Packs/day: 0.25    Years: 30.00    Types: Cigarettes    Quit date: 03/09/2014  . Smokeless tobacco: Never Used     Comment: Counseling sheet given 08-2011  . Alcohol use 4.2 oz/week    7 Standard drinks or equivalent per week     Comment: week  . Drug use: No  . Sexual activity: Not on file   Other Topics Concern  . Not on file   Social History Narrative  . No narrative on file    Mr. Roddey's family history includes Colon cancer in his mother; Colon cancer (age of onset: 87) in his father; Esophageal cancer in his father; Heart disease in his father.      Objective:    Vitals:   05/19/16 0856  BP: 120/70  Pulse: 68    Physical Exam  well-developed older white male in no acute distress, pleasant blood pressure 120/70 pulse 68, height 6 foot, weight 199, BMI 27.0. HEENT; nontraumatic normocephalic EOMI PERRLA sclera anicteric, Cardiovascular; regular rate and rhythm with S1-S2 no murmur or gallop, Pulmonary; clear bilaterally, Abdomen ;soft, basically nontender there is no palpable mass or hepatosplenomegaly rectal exam not done, Extremities ;no clubbing cyanosis or edema skin warm and dry, Neuropsych; mood and affect appropriate       Assessment & Plan:   #32 61 year old white male with 1 month history of very frequent small volume loose stools and blood, associated with tenesmus and sense of incomplete evacuation. Etiology not entirely clear, this does not sound hemorrhoidal, rule out exacerbation of small focus of active colitis noted on previous colonoscopy, proctitis or occult lesion. 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 Hilarie Fredrickson. We have worked him in on 05/24/2016 as did not want his procedure to be delayed.. Both procedure procedures discussed in detail with patient including risks and benefits and he is agreeable to proceed. Further plans and management pending findings.  Biddie Sebek S Rozella Servello PA-C 05/19/2016   Cc: Elby Showers, MD   Addendum: Reviewed and agree with management. Jerene Bears, MD

## 2016-05-19 NOTE — Patient Instructions (Addendum)
Please go to the basement level to have your labs drawn.   We have sent the following medications to your pharmacy for you to pick up at your convenience: 1. Protonix 40 mg We have given you the prep sample for the colonoscopy.  You have been scheduled for a Colonoscopy and Endoscopy. Please follow written instructions given to you at your visit today.   If you use inhalers (even only as needed), please bring them with you on the day of your procedure. Your physician has requested that you go to www.startemmi.com and enter the access code given to you at your visit today. This web site gives a general overview about your procedure. However, you should still follow specific instructions given to you by our office regarding your preparation for the procedure.

## 2016-05-21 DIAGNOSIS — G4733 Obstructive sleep apnea (adult) (pediatric): Secondary | ICD-10-CM | POA: Diagnosis not present

## 2016-05-23 ENCOUNTER — Telehealth: Payer: Self-pay

## 2016-05-23 NOTE — Telephone Encounter (Signed)
Should be ok to proceed with procedures as scheduled

## 2016-05-23 NOTE — Telephone Encounter (Signed)
Informing patient of his lab results today, he mentioned that he did not read the prep instructions very well. He took his metamucil on 5/6, was to have stopped it on 5/3. Procedure is on 5/8. Please advise if this patient needs to be rescheduled. Thank you.

## 2016-05-24 ENCOUNTER — Ambulatory Visit (AMBULATORY_SURGERY_CENTER): Payer: Commercial Managed Care - HMO | Admitting: Internal Medicine

## 2016-05-24 ENCOUNTER — Encounter: Payer: Self-pay | Admitting: Internal Medicine

## 2016-05-24 VITALS — BP 118/75 | HR 58 | Temp 97.8°F | Resp 17 | Ht 72.0 in | Wt 199.0 lb

## 2016-05-24 DIAGNOSIS — D123 Benign neoplasm of transverse colon: Secondary | ICD-10-CM

## 2016-05-24 DIAGNOSIS — K219 Gastro-esophageal reflux disease without esophagitis: Secondary | ICD-10-CM | POA: Diagnosis not present

## 2016-05-24 DIAGNOSIS — K51511 Left sided colitis with rectal bleeding: Secondary | ICD-10-CM | POA: Diagnosis not present

## 2016-05-24 DIAGNOSIS — K529 Noninfective gastroenteritis and colitis, unspecified: Secondary | ICD-10-CM | POA: Diagnosis not present

## 2016-05-24 DIAGNOSIS — K625 Hemorrhage of anus and rectum: Secondary | ICD-10-CM | POA: Diagnosis present

## 2016-05-24 DIAGNOSIS — Z86010 Personal history of colonic polyps: Secondary | ICD-10-CM

## 2016-05-24 DIAGNOSIS — K2270 Barrett's esophagus without dysplasia: Secondary | ICD-10-CM | POA: Diagnosis not present

## 2016-05-24 MED ORDER — MESALAMINE 1.2 G PO TBEC: 4.8000 g | DELAYED_RELEASE_TABLET | Freq: Every day | ORAL | 3 refills | Status: DC

## 2016-05-24 MED ORDER — SODIUM CHLORIDE 0.9 % IV SOLN: 500.0000 mL | INTRAVENOUS | Status: DC

## 2016-05-24 NOTE — Progress Notes (Signed)
Called to room to assist during endoscopic procedure.  Patient ID and intended procedure confirmed with present staff. Received instructions for my participation in the procedure from the performing physician.  

## 2016-05-24 NOTE — Op Note (Signed)
[facility] Patient Name: James [name] Procedure Date: 05/24/2016 8:28 AM MRN: [id number] Endoscopist: Jerene Bears , MD Age: 61 Referring MD:  Date of Birth: 27-Feb-1955 Gender: Male Account #: [id number] Procedure:                Colonoscopy Indications:              High risk colon cancer surveillance: Personal                            history of colonic polyps, Last colonoscopy 5 years                            ago, history of segmental colitis in 2013 without                            chronicity and asymptomatic at that time, recent                            rectal bleeding, tenesmus and frequent stools Medicines:                Monitored Anesthesia Care Procedure:                Pre-Anesthesia Assessment:                           - Prior to the procedure, a History and Physical                            was performed, and patient medications and                            allergies were reviewed. The patient's tolerance of                            previous anesthesia was also reviewed. The risks                            and benefits of the procedure and the sedation                            options and risks were discussed with the patient.                            All questions were answered, and informed consent                            was obtained. Prior Anticoagulants: The patient has                            taken no previous anticoagulant or antiplatelet                            agents. ASA Grade Assessment: II - A patient with  mild systemic disease. After reviewing the risks                            and benefits, the patient was deemed in                            satisfactory condition to undergo the procedure.                           After obtaining informed consent, the colonoscope                            was passed under direct vision. Throughout the                            procedure, the patient's  blood pressure, pulse, and                            oxygen saturations were monitored continuously. The                            Colonoscope was introduced through the anus and                            advanced to the the terminal ileum. The colonoscopy                            was performed without difficulty. The patient                            tolerated the procedure well. The quality of the                            bowel preparation was good. The terminal ileum,                            ileocecal valve, appendiceal orifice, and rectum                            were photographed. Scope In: 8:43:28 AM Scope Out: 9:03:49 AM Scope Withdrawal Time: 0 hours 15 minutes 51 seconds  Total Procedure Duration: 0 hours 20 minutes 21 seconds  Findings:                 The digital rectal exam was normal.                           The terminal ileum appeared normal.                           Normal mucosa was found in the descending colon, in                            the transverse colon, in the ascending colon and in  the cecum.                           A 7 mm polyp was found in the transverse colon. The                            polyp was sessile. The polyp was removed with a                            cold snare. Resection and retrieval were complete.                           Inflammation characterized by altered vascularity,                            congestion (edema), erosions, erythema, friability,                            granularity, mucus and shallow ulcerations was                            found in a continuous and circumferential pattern                            from the rectum to the sigmoid colon (to 40 cm).                            This was moderate in severity, and when compared to                            previous examinations, the findings are worsened.                            Biopsies were taken with a cold forceps for                             histology.                           Multiple diverticula were found in the sigmoid                            colon and descending colon.                           Internal hemorrhoids were found during                            retroflexion. The hemorrhoids were medium-sized. Complications:            No immediate complications. Estimated Blood Loss:     Estimated blood loss was minimal. Impression:               - The examined portion of the ileum was normal.                           -  Normal mucosa in the descending colon, in the                            transverse colon, in the ascending colon and in the                            cecum.                           - One 7 mm polyp in the transverse colon, removed                            with a cold snare. Resected and retrieved.                           - Left-sided ulcerative colitis. Inflammation was                            found from the rectum to the sigmoid colon. This                            was moderate in severity. Biopsied.                           - Diverticulosis in the sigmoid colon and in the                            descending colon.                           - Internal hemorrhoids. Recommendation:           - Patient has a contact number available for                            emergencies. The signs and symptoms of potential                            delayed complications were discussed with the                            patient. Return to normal activities tomorrow.                            Written discharge instructions were provided to the                            patient.                           - Resume previous diet.                           - Continue present medications.                           -  No ibuprofen, naproxen, or other non-steroidal                            anti-inflammatory drugs.                           - Begin Lialda 4.8 grams daily. If  not                            significantly improving within 2 weeks, please                            contact my office for additional treatment                            recommendations.                           - Office follow-up in about 8 weeks.                           - Await pathology results.                           - Repeat colonoscopy in 6 months to check healing                            and for surveillance. Jerene Bears, MD 05/24/2016 9:19:10 AM This report has been signed electronically.

## 2016-05-24 NOTE — Op Note (Signed)
[facility] Patient Name: James [name] Procedure Date: 05/24/2016 8:28 AM MRN: [id number] Endoscopist: Jerene Bears , MD Age: 61 Referring MD:  Date of Birth: November 02, 1955 Gender: Male Account #: [id number] Procedure:                Upper GI endoscopy Indications:              Gastro-esophageal reflux disease, Screening for                            Barrett's esophagus in patient at risk for this                            condition Medicines:                Monitored Anesthesia Care Procedure:                Pre-Anesthesia Assessment:                           - Prior to the procedure, a History and Physical                            was performed, and patient medications and                            allergies were reviewed. The patient's tolerance of                            previous anesthesia was also reviewed. The risks                            and benefits of the procedure and the sedation                            options and risks were discussed with the patient.                            All questions were answered, and informed consent                            was obtained. Prior Anticoagulants: The patient has                            taken no previous anticoagulant or antiplatelet                            agents. ASA Grade Assessment: II - A patient with                            mild systemic disease. After reviewing the risks                            and benefits, the patient was deemed in  satisfactory condition to undergo the procedure.                           After obtaining informed consent, the endoscope was                            passed under direct vision. Throughout the                            procedure, the patient's blood pressure, pulse, and                            oxygen saturations were monitored continuously. The                            Model GIF-[id number]) scope was introduced                           through the mouth, and advanced to the second part                            of duodenum. The upper GI endoscopy was                            accomplished without difficulty. The patient                            tolerated the procedure well. Scope In: Scope Out: Findings:                 The esophagus and gastroesophageal junction were                            examined with white light and narrow band imaging                            (NBI) from a forward view and retroflexed position.                            There were esophageal mucosal changes suspicious                            for short-segment Barrett's esophagus. These                            changes involved the mucosa at the upper extent of                            the gastric folds (41 cm from the incisors)                            extending to the Z-line (38 cm from the incisors).  Salmon-colored mucosa was present. The maximum                            longitudinal extent of these esophageal mucosal                            changes was 2-3 cm in length. Mucosa was biopsied                            with a cold forceps for histology in a targeted                            manner and in 4 quadrants at intervals of 1 cm from                            38 to 41 cm from the incisors. One specimen bottle                            was sent to pathology.                           A 3 cm hiatal hernia was present.                           The entire examined stomach was normal.                           The examined duodenum was normal. Complications:            No immediate complications. Estimated Blood Loss:     Estimated blood loss was minimal. Impression:               - Esophageal mucosal changes suspicious for                            short-segment Barrett's esophagus. Biopsied.                           - 3 cm hiatal hernia.                            - Normal stomach.                           - Normal examined duodenum. Recommendation:           - Patient has a contact number available for                            emergencies. The signs and symptoms of potential                            delayed complications were discussed with the                            patient. Return to normal  activities tomorrow.                            Written discharge instructions were provided to the                            patient.                           - Resume previous diet.                           - Continue present medications including                            pantoprazole 40 mg daily.                           - Await pathology results.                           - Repeat upper endoscopy for surveillance based on                            pathology results. Jerene Bears, MD 05/24/2016 9:11:33 AM This report has been signed electronically.

## 2016-05-24 NOTE — Patient Instructions (Addendum)
YOU HAD AN ENDOSCOPIC PROCEDURE TODAY AT [facility]:   Refer to the procedure report that was given to you for any specific questions about what was found during the examination.  If the procedure report does not answer your questions, please call your gastroenterologist to clarify.  If you requested that your care partner not be given the details of your procedure findings, then the procedure report has been included in a sealed envelope for you to review at your convenience later.  YOU SHOULD EXPECT: Some feelings of bloating in the abdomen. Passage of more gas than usual.  Walking can help get rid of the air that was put into your GI tract during the procedure and reduce the bloating. If you had a lower endoscopy (such as a colonoscopy or flexible sigmoidoscopy) you may notice spotting of blood in your stool or on the toilet paper. If you underwent a bowel prep for your procedure, you may not have a normal bowel movement for a few days.  Please Note:  You might notice some irritation and congestion in your nose or some drainage.  This is from the oxygen used during your procedure.  There is no need for concern and it should clear up in a day or so.  SYMPTOMS TO REPORT IMMEDIATELY:   Following lower endoscopy (colonoscopy or flexible sigmoidoscopy):  Excessive amounts of blood in the stool  Significant tenderness or worsening of abdominal pains  Swelling of the abdomen that is new, acute  Fever of 100F or higher   Following upper endoscopy (EGD)  Vomiting of blood or coffee ground material  New chest pain or pain under the shoulder blades  Painful or persistently difficult swallowing  New shortness of breath  Fever of 100F or higher  Black, tarry-looking stools  For urgent or emergent issues, a gastroenterologist can be reached at any hour by calling [phone].  Medications: Please start Lialda 4 gm daily. Please notifiy Dr. Hilarie Fredrickson if no improvement in 2 weeks. No  ibuprofen, ASA or NSAIDS.   DIET:  We do recommend a small meal at first, but then you may proceed to your regular diet.  Drink plenty of fluids but you should avoid alcoholic beverages for 24 hours.  Please read all handouts given to you by your recovery nurse.   ACTIVITY:  You should plan to take it easy for the rest of today and you should NOT DRIVE or use heavy machinery until tomorrow (because of the sedation medicines used during the test).    FOLLOW UP: Our staff will call the number listed on your records the next business day following your procedure to check on you and address any questions or concerns that you may have regarding the information given to you following your procedure. If we do not reach you, we will leave a message.  However, if you are feeling well and you are not experiencing any problems, there is no need to return our call.  We will assume that you have returned to your regular daily activities without incident.  If any biopsies were taken you will be contacted by phone or by letter within the next 1-3 weeks.  Please call us at [phone] if you have not heard about the biopsies in 3 weeks.    SIGNATURES/CONFIDENTIALITY: You and/or your care partner have signed paperwork which will be entered into your electronic medical record.  These signatures attest to the fact that that the information above on your After  Visit Summary has been reviewed and is understood.  Full responsibility of the confidentiality of this discharge information lies with you and/or your care-partner.  Thank you for letting us take care of your healthcare needs today.

## 2016-05-24 NOTE — Progress Notes (Signed)
To PACU VSS, Report to [email or web address]

## 2016-05-25 ENCOUNTER — Telehealth: Payer: Self-pay | Admitting: Internal Medicine

## 2016-05-25 ENCOUNTER — Telehealth: Payer: Self-pay

## 2016-05-25 NOTE — Telephone Encounter (Signed)
No name or number identifier. Left voicemail.

## 2016-05-25 NOTE — Telephone Encounter (Signed)
Left message to call back  

## 2016-05-26 ENCOUNTER — Telehealth: Payer: Self-pay | Admitting: *Deleted

## 2016-05-26 NOTE — Telephone Encounter (Signed)
Discussed with pt and his wife that he can eat foods rich in iron to increase his Hgb and to help with anemia. Pt verbalized understanding.

## 2016-05-26 NOTE — Telephone Encounter (Signed)
  Follow up Call-  Call back number 05/24/2016  Post procedure Call Back phone  # [phone]  Permission to leave phone message Yes  Some recent data might be hidden    [facility]

## 2016-05-30 ENCOUNTER — Encounter: Payer: Self-pay | Admitting: Internal Medicine

## 2016-05-30 ENCOUNTER — Ambulatory Visit (INDEPENDENT_AMBULATORY_CARE_PROVIDER_SITE_OTHER): Payer: Commercial Managed Care - HMO | Admitting: Internal Medicine

## 2016-05-30 VITALS — BP 136/80 | HR 73 | Temp 98.2°F | Ht 72.0 in | Wt 196.0 lb

## 2016-05-30 DIAGNOSIS — K2270 Barrett's esophagus without dysplasia: Secondary | ICD-10-CM

## 2016-05-30 DIAGNOSIS — K5120 Ulcerative (chronic) proctitis without complications: Secondary | ICD-10-CM

## 2016-05-30 DIAGNOSIS — J069 Acute upper respiratory infection, unspecified: Secondary | ICD-10-CM | POA: Diagnosis not present

## 2016-05-30 MED ORDER — HYDROCODONE-HOMATROPINE 5-1.5 MG/5ML PO SYRP: 5.0000 mL | ORAL_SOLUTION | Freq: Three times a day (TID) | ORAL | 0 refills | Status: DC | PRN

## 2016-05-30 MED ORDER — CLARITHROMYCIN 500 MG PO TABS: 500.0000 mg | ORAL_TABLET | Freq: Two times a day (BID) | ORAL | 0 refills | Status: DC

## 2016-05-30 NOTE — Progress Notes (Signed)
   Subjective:    Patient ID: James [name], male    DOB: Dec 06, 1955, 61 y.o.   MRN: [id number]  HPI 61 year old Male diagnosed with colitis recently by Dr. Hilarie Fredrickson treated  With Lialda had onset of URI symptoms Thursday evening.Has malaise fatigue cough and congestion.  Has questions about ulcerative colitis. Was shown pictures from his recent colonoscopy. He Is interested in seeing what colitis actually looks like. Beginning to feel better with Lialda in terms of diarrhea and frequency of stools. Spent considerable time reviewing study with him and answering his questions.    Review of Systems no myalgias or shaking chills just malaise and fatigue     Objective:   Physical Exam Skin warm and dry. Nodes none. Pharynx is slightly injected. TMs are clear. Neck is supple. Chest clear to auscultation without rales or wheezing.       Assessment & Plan:  Acute URI  Ulcerative colitis being treated by GI with Lialda  Plan: Patient requesting Hycodan for cough 1 teaspoon by mouth every 8 hours when necessary cough. Biaxin 500 mg twice daily for 10 days. Rest and drink plenty of fluids. Follow-up with GI as directed  25 minutes spent with patient including discussion of ulcerative colitis

## 2016-06-01 ENCOUNTER — Telehealth: Payer: Self-pay | Admitting: Internal Medicine

## 2016-06-01 NOTE — Telephone Encounter (Signed)
Left message for pt to call back  °

## 2016-06-03 NOTE — Telephone Encounter (Signed)
Did not return call.

## 2016-06-05 NOTE — Patient Instructions (Signed)
Biaxin 500 mg twice daily for 10 days. Hycodan 1 teaspoon by mouth every 8 hours when necessary cough. Rest and drink plenty of fluids. Discussion held regarding ulcerative colitis and findings.

## 2016-06-16 ENCOUNTER — Other Ambulatory Visit: Payer: Self-pay | Admitting: Internal Medicine

## 2016-06-16 ENCOUNTER — Other Ambulatory Visit: Payer: Commercial Managed Care - HMO | Admitting: Internal Medicine

## 2016-06-16 DIAGNOSIS — D649 Anemia, unspecified: Secondary | ICD-10-CM

## 2016-06-16 LAB — CBC WITH DIFFERENTIAL/PLATELET
Basophils Absolute: 85 cells/uL (ref 0–200)
Basophils Relative: 1 %
Eosinophils Absolute: 510 cells/uL — ABNORMAL HIGH (ref 15–500)
Eosinophils Relative: 6 %
HCT: 31.5 % — ABNORMAL LOW (ref 38.5–50.0)
Hemoglobin: 10.1 g/dL — ABNORMAL LOW (ref 13.2–17.1)
Lymphocytes Relative: 22 %
Lymphs Abs: 1870 cells/uL (ref 850–3900)
MCH: 29.8 pg (ref 27.0–33.0)
MCHC: 32.1 g/dL (ref 32.0–36.0)
MCV: 92.9 fL (ref 80.0–100.0)
MPV: 10.8 fL (ref 7.5–12.5)
Monocytes Absolute: 850 cells/uL (ref 200–950)
Monocytes Relative: 10 %
Neutro Abs: 5185 cells/uL (ref 1500–7800)
Neutrophils Relative %: 61 %
Platelets: 444 10*3/uL — ABNORMAL HIGH (ref 140–400)
RBC: 3.39 MIL/uL — ABNORMAL LOW (ref 4.20–5.80)
RDW: 13.8 % (ref 11.0–15.0)
WBC: 8.5 10*3/uL (ref 3.8–10.8)

## 2016-06-17 LAB — IRON AND TIBC
%SAT: 13 % — ABNORMAL LOW (ref 15–60)
Iron: 46 ug/dL — ABNORMAL LOW (ref 50–180)
TIBC: 362 ug/dL (ref 250–425)
UIBC: 316 ug/dL

## 2016-06-18 LAB — VITAMIN B12: Vitamin B-12: 658 pg/mL (ref 200–1100)

## 2016-06-18 LAB — FOLATE: Folate: 14.5 ng/mL (ref >5.4)

## 2016-06-20 ENCOUNTER — Ambulatory Visit: Payer: Commercial Managed Care - HMO | Admitting: Internal Medicine

## 2016-06-21 DIAGNOSIS — G4733 Obstructive sleep apnea (adult) (pediatric): Secondary | ICD-10-CM | POA: Diagnosis not present

## 2016-06-30 ENCOUNTER — Telehealth: Payer: Self-pay | Admitting: Internal Medicine

## 2016-06-30 DIAGNOSIS — R197 Diarrhea, unspecified: Secondary | ICD-10-CM

## 2016-06-30 MED ORDER — BUDESONIDE 9 MG PO TB24: 9.0000 mg | ORAL_TABLET | Freq: Every day | ORAL | 2 refills | Status: DC

## 2016-06-30 NOTE — Telephone Encounter (Signed)
Spoke with pt and he is aware. Order in epic. Script sent to pharmacy.

## 2016-06-30 NOTE — Telephone Encounter (Signed)
Pt states that at 2 weeks after the colon his bleeding had stopped. States his PCP started him on Iron pills about a 2 weeks ago. 4 days ago pt reports his stool was almost normal. Now pt states he is having 5-8 diarrhea stools/day. He has an upcoming appt July 16th but wanted to know if he should be doing anything different. Please advise.

## 2016-06-30 NOTE — Telephone Encounter (Signed)
Would continue Lialda 4.8 g daily and add Uceris 9 mg daily 8 weeks for left-sided UC Check c diff PCR (send stat to quicken results)

## 2016-07-01 ENCOUNTER — Other Ambulatory Visit: Payer: Commercial Managed Care - HMO

## 2016-07-01 ENCOUNTER — Telehealth: Payer: Self-pay | Admitting: Internal Medicine

## 2016-07-01 MED ORDER — BUDESONIDE 9 MG PO TB24: 9.0000 mg | ORAL_TABLET | Freq: Every day | ORAL | 2 refills | Status: DC

## 2016-07-01 NOTE — Telephone Encounter (Signed)
Pt had cdiff test done with PCP 05/12/16 and it was negative. Pt wanted to know if he needed to have it repeated. Discussed with pt that he did need to repeat the labs. Pt verbalized understanding.

## 2016-07-01 NOTE — Telephone Encounter (Signed)
Rx resent.

## 2016-07-04 ENCOUNTER — Other Ambulatory Visit: Payer: Commercial Managed Care - HMO

## 2016-07-04 DIAGNOSIS — R197 Diarrhea, unspecified: Secondary | ICD-10-CM

## 2016-07-05 LAB — CLOSTRIDIUM DIFFICILE BY PCR: Toxigenic C. Difficile by PCR: NOT DETECTED

## 2016-07-12 ENCOUNTER — Encounter: Payer: Commercial Managed Care - HMO | Admitting: Internal Medicine

## 2016-07-21 ENCOUNTER — Ambulatory Visit: Payer: Commercial Managed Care - HMO | Admitting: Internal Medicine

## 2016-07-21 DIAGNOSIS — G4733 Obstructive sleep apnea (adult) (pediatric): Secondary | ICD-10-CM | POA: Diagnosis not present

## 2016-08-01 ENCOUNTER — Other Ambulatory Visit (INDEPENDENT_AMBULATORY_CARE_PROVIDER_SITE_OTHER): Payer: 59

## 2016-08-01 ENCOUNTER — Encounter: Payer: Self-pay | Admitting: Internal Medicine

## 2016-08-01 ENCOUNTER — Ambulatory Visit (INDEPENDENT_AMBULATORY_CARE_PROVIDER_SITE_OTHER): Payer: 59 | Admitting: Internal Medicine

## 2016-08-01 VITALS — BP 120/80 | HR 84 | Ht 72.0 in | Wt 201.0 lb

## 2016-08-01 DIAGNOSIS — K2270 Barrett's esophagus without dysplasia: Secondary | ICD-10-CM | POA: Diagnosis not present

## 2016-08-01 DIAGNOSIS — K5150 Left sided colitis without complications: Secondary | ICD-10-CM

## 2016-08-01 DIAGNOSIS — D509 Iron deficiency anemia, unspecified: Secondary | ICD-10-CM

## 2016-08-01 DIAGNOSIS — K219 Gastro-esophageal reflux disease without esophagitis: Secondary | ICD-10-CM

## 2016-08-01 DIAGNOSIS — K51511 Left sided colitis with rectal bleeding: Secondary | ICD-10-CM | POA: Diagnosis not present

## 2016-08-01 LAB — COMPREHENSIVE METABOLIC PANEL
ALT: 12 U/L (ref 0–53)
AST: 20 U/L (ref 0–37)
Albumin: 4 g/dL (ref 3.5–5.2)
Alkaline Phosphatase: 46 U/L (ref 39–117)
BUN: 16 mg/dL (ref 6–23)
CO2: 28 mEq/L (ref 19–32)
Calcium: 9.3 mg/dL (ref 8.4–10.5)
Chloride: 104 mEq/L (ref 96–112)
Creatinine, Ser: 0.88 mg/dL (ref 0.40–1.50)
GFR: 93.22 mL/min (ref >60.00)
Glucose, Bld: 72 mg/dL (ref 70–99)
Potassium: 3.8 mEq/L (ref 3.5–5.1)
Sodium: 139 mEq/L (ref 135–145)
Total Bilirubin: 0.4 mg/dL (ref 0.2–1.2)
Total Protein: 7.1 g/dL (ref 6.0–8.3)

## 2016-08-01 LAB — CBC WITH DIFFERENTIAL/PLATELET
Basophils Absolute: 0 10*3/uL (ref 0.0–0.1)
Basophils Relative: 0.6 % (ref 0.0–3.0)
Eosinophils Absolute: 0.1 10*3/uL (ref 0.0–0.7)
Eosinophils Relative: 1.1 % (ref 0.0–5.0)
HCT: 36.5 % — ABNORMAL LOW (ref 39.0–52.0)
Hemoglobin: 12.2 g/dL — ABNORMAL LOW (ref 13.0–17.0)
Lymphocytes Relative: 20.7 % (ref 12.0–46.0)
Lymphs Abs: 1.7 10*3/uL (ref 0.7–4.0)
MCHC: 33.6 g/dL (ref 30.0–36.0)
MCV: 93.8 fl (ref 78.0–100.0)
Monocytes Absolute: 0.8 10*3/uL (ref 0.1–1.0)
Monocytes Relative: 9.9 % (ref 3.0–12.0)
Neutro Abs: 5.4 10*3/uL (ref 1.4–7.7)
Neutrophils Relative %: 67.7 % (ref 43.0–77.0)
Platelets: 315 10*3/uL (ref 150.0–400.0)
RBC: 3.89 Mil/uL — ABNORMAL LOW (ref 4.22–5.81)
RDW: 16.7 % — ABNORMAL HIGH (ref 11.5–15.5)
WBC: 8 10*3/uL (ref 4.0–10.5)

## 2016-08-01 LAB — HIGH SENSITIVITY CRP: CRP, High Sensitivity: 1.19 mg/L (ref 0.000–5.000)

## 2016-08-01 LAB — IBC PANEL
Iron: 122 ug/dL (ref 42–165)
Saturation Ratios: 31.2 % (ref 20.0–50.0)
Transferrin: 279 mg/dL (ref 212.0–360.0)

## 2016-08-01 LAB — FERRITIN: Ferritin: 18 ng/mL — ABNORMAL LOW (ref 22.0–322.0)

## 2016-08-01 NOTE — Progress Notes (Signed)
Subjective:    Patient ID: James [name], male    DOB: 28-Jun-1955, 61 y.o.   MRN: [id number]  HPI James [name] is a 61 yo male with PMH of left-sided ulcerative colitis (dx 2018 by colonoscopy), Recent diagnosis Barrett's esophagus who is here for follow-up. He was seen on 05/19/2016 by Nicoletta Ba, PA-C at that time he was having frequent diarrhea with blood and mucus. Abdominal pain was never really a problem for him. He was having some tenesmus.  Colonoscopy was performed by me on 05/24/2016. This revealed normal terminal ileum, 7 mm transverse colon polyp found to be a sessile serrated polyp. An moderate colitis from the rectum to sigmoid colon at 40 cm. Biopsies consistent with chronic active colitis. Multiple diverticuli in the left colon and internal hemorrhoids were found.  After his colonoscopy he began Lialda 4.8 g daily. He called back several weeks later and was still having some diarrhea. We check C. difficile which was negative and started him on Uceris 9 mg daily. He has completed 29 days of Uceris therapy.  He reports that he is 80% better. He is no longer seeing blood or mucus in his stools. His stools have not yet returned to being completely formed. He still has had no abdominal pain. He's having about 1-2 bowel movements per day. He has avoided raw vegetables and fruits.  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 Previously it was recommended he avoid steroids. He has some stable loss of vision in his right eye in the lateral visual field.  Heartburn is well-controlled with pantoprazole 40 mg daily. Upper endoscopy revealed short segment Barrett's esophagus without dysplasia. There was a 3 cm hiatal hernia. The examined stomach and small bowel were normal.  He has been on iron replacement initially 2 tablets daily now 1 tablet daily prescribed by primary care Dr. Renold Genta  Review of Systems   As per history of present  illness, otherwise negative  Current Medications, Allergies, Past Medical History, Past Surgical History, Family History and Social History were reviewed in [employer] record.    Objective:   Physical Exam BP 120/80   Pulse 84   Ht 6' (1.829 m)   Wt 201 lb (91.2 kg)   BMI 27.26 kg/m  Constitutional: Well-developed and well-nourished. No distress. HEENT: Normocephalic and atraumatic.  Conjunctivae are normal.  No scleral icterus. Neck: Neck supple. Trachea midline. Cardiovascular: Normal rate, regular rhythm and intact distal pulses. No M/R/G Pulmonary/chest: Effort normal and breath sounds normal. No wheezing, rales or rhonchi. Abdominal: Soft, nontender, nondistended. Bowel sounds active throughout. There are no masses palpable. No hepatosplenomegaly. Extremities: no clubbing, cyanosis, or edema Neurological: Alert and oriented to person place and time. Skin: Skin is warm and dry. Psychiatric: Normal mood and affect. Behavior is normal.  CBC    Component Value Date/Time   WBC 8.5 06/16/2016 1242   RBC 3.39 (L) 06/16/2016 1242   HGB 10.1 (L) 06/16/2016 1242   HCT 31.5 (L) 06/16/2016 1242   PLT 444 (H) 06/16/2016 1242   MCV 92.9 06/16/2016 1242   MCH 29.8 06/16/2016 1242   MCHC 32.1 06/16/2016 1242   RDW 13.8 06/16/2016 1242   LYMPHSABS 1,870 06/16/2016 1242   MONOABS 850 06/16/2016 1242   EOSABS 510 (H) 06/16/2016 1242   BASOSABS 85 06/16/2016 1242   CMP     Component Value Date/Time   NA 139 06/30/2015 1105   K 4.8  06/30/2015 1105   CL 103 06/30/2015 1105   CO2 25 06/30/2015 1105   GLUCOSE 87 06/30/2015 1105   BUN 15 06/30/2015 1105   CREATININE 0.98 06/30/2015 1105   CALCIUM 9.8 06/30/2015 1105   PROT 7.1 06/30/2015 1105   ALBUMIN 4.4 06/30/2015 1105   AST 21 06/30/2015 1105   ALT 11 06/30/2015 1105   ALKPHOS 48 06/30/2015 1105   BILITOT 0.7 06/30/2015 1105   GFRNONAA 83 06/30/2015 1105   GFRAA >89 06/30/2015 1105         Assessment & Plan:  61 yo male with PMH of left-sided ulcerative colitis (dx 2018 by colonoscopy), Recent diagnosis Barrett's esophagus who is here for follow-up.  1.  Left-sided ulcerative colitis  -- he has improved with mesalamine and Uceris.  I will have him stop Uceris due to his macular eye condition. I also asked that he rechecked his [facility] ophthalmologist to inquire about the use of budesonide in the future as it relates to his eye disease. He is also due ophthalmology follow-up.  --Continue Lialda 4.8 g daily  --If colitis symptoms return after stopping budesonide he is asked to notify me he voices understanding  --Follow-up in about 3 months and discuss repeat colonoscopy to assess response to therapy  --Can liberalize diet as tolerated --Check CRP  2. Barrett's esophagus without dysplasia  -- continue pantoprazole 40 mg once daily. Repeat EGD recommended in 3 years   3. IDA  -- secondary to #1. Continue oral iron once daily. Check CBC and iron studies today.   25 minutes spent with the patient today. Greater than 50% was spent in counseling and coordination of care with the patient

## 2016-08-01 NOTE — Patient Instructions (Signed)
Finish your current [employer] and discontinue.  Speak with your [facility] opthalmologist regarding taking Uceris in the future.  Continue your Lialda 4.8 mg daily.  Your physician has requested that you go to the basement for the following lab work before leaving today: CBC, CMP, IBC, Ferritin, CRP  Please follow up with Dr Hilarie Fredrickson in 3-4 months.  If you are age 62 or older, your body mass index should be between 23-30. Your Body mass index is 27.26 kg/m. If this is out of the aforementioned range listed, please consider follow up with your Primary Care Provider.  If you are age 76 or younger, your body mass index should be between 19-25. Your Body mass index is 27.26 kg/m. If this is out of the aformentioned range listed, please consider follow up with your Primary Care Provider.

## 2016-08-02 ENCOUNTER — Other Ambulatory Visit: Payer: Self-pay

## 2016-08-02 ENCOUNTER — Other Ambulatory Visit: Payer: Commercial Managed Care - HMO | Admitting: Internal Medicine

## 2016-08-02 DIAGNOSIS — D509 Iron deficiency anemia, unspecified: Secondary | ICD-10-CM

## 2016-08-04 ENCOUNTER — Encounter: Payer: Self-pay | Admitting: Internal Medicine

## 2016-08-04 ENCOUNTER — Ambulatory Visit (INDEPENDENT_AMBULATORY_CARE_PROVIDER_SITE_OTHER): Payer: 59 | Admitting: Internal Medicine

## 2016-08-04 ENCOUNTER — Other Ambulatory Visit: Payer: Commercial Managed Care - HMO | Admitting: Internal Medicine

## 2016-08-04 VITALS — BP 118/76 | HR 61 | Temp 97.4°F | Ht 70.0 in | Wt 198.0 lb

## 2016-08-04 DIAGNOSIS — Z96641 Presence of right artificial hip joint: Secondary | ICD-10-CM

## 2016-08-04 DIAGNOSIS — E785 Hyperlipidemia, unspecified: Secondary | ICD-10-CM

## 2016-08-04 DIAGNOSIS — E7800 Pure hypercholesterolemia, unspecified: Secondary | ICD-10-CM | POA: Diagnosis not present

## 2016-08-04 DIAGNOSIS — Z125 Encounter for screening for malignant neoplasm of prostate: Secondary | ICD-10-CM | POA: Diagnosis not present

## 2016-08-04 DIAGNOSIS — K219 Gastro-esophageal reflux disease without esophagitis: Secondary | ICD-10-CM | POA: Diagnosis not present

## 2016-08-04 DIAGNOSIS — K51318 Ulcerative (chronic) rectosigmoiditis with other complication: Secondary | ICD-10-CM

## 2016-08-04 DIAGNOSIS — G4733 Obstructive sleep apnea (adult) (pediatric): Secondary | ICD-10-CM

## 2016-08-04 DIAGNOSIS — Z0000 Encounter for general adult medical examination without abnormal findings: Secondary | ICD-10-CM | POA: Diagnosis not present

## 2016-08-04 LAB — POCT URINALYSIS DIPSTICK
Bilirubin, UA: NEGATIVE
Glucose, UA: NEGATIVE
Ketones, UA: NEGATIVE
Leukocytes, UA: NEGATIVE
Nitrite, UA: NEGATIVE
Protein, UA: NEGATIVE
Spec Grav, UA: 1.02 (ref 1.010–1.025)
Urobilinogen, UA: 0.2 E.U./dL
pH, UA: 6.5 (ref 5.0–8.0)

## 2016-08-04 NOTE — Progress Notes (Signed)
Subjective:    Patient ID: James [name], male    DOB: March 31, 1955, 61 y.o.   MRN: [id number]  HPI  61  Year old Male  for health maintenance exam. Recently diagnosed with Ulcerative Colitis and is followed by Dr. Hilarie Fredrickson. Also has Barrett's esophagus.  Diagnosed recently with iron deficiency anemia. Dr. Elmo Putt has decreased his iron supplement from twice daily to once daily since anemia has improved from 10.1 g to 12.2 g. His MCV is normal at 93.8. MCV was also normal when he was iron deficient and hemoglobin was 10.1 g. His iron level is now 122 and previously was 46.  He is being treated with Lialda and budesonide. Takes PPI for GE reflux.  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 Eye doctor aware of UC diagnosis and steroid medication. Records indicate he saw Dr. Helane Rima at [facility] in [facility] October 2017 and was diagnosed with central serous chorioretinopathy of right eye  Says he is feeling better. Declines prostate exam today due to continued loose stools. Fasting lab work drawn and pending including lipid panel and PSA. He had CBC done on July 16.  In 2013 he had a colonoscopy. He had a small focus of colitis and one tubular adenoma but he never went back for follow-up. History of internal hemorrhoids.  History of low back pain and meralgia paresthetica right leg. He saw Dr. Joya Salm many years ago and was told he had degenerative disc disease in his spine.  Status post right hip replacement by Dr. Ninfa Linden February 2016.  Says he had Zostavax vaccine at pharmacy 2016. Tetanus immunization is up-to-date.  History of GE reflux treated with PPI  History of smoking in the remote past. He quit around the time of his hip replacement surgery in 2016. Prior to that he smoked pack of cigarettes daily for over 25 years.  No known drug allergies.  History of snoring and apnea. Was seen by pulmonology and was found to have sleep apnea. He is on C Pap at 5-15  centimeters of water.  Past medical history: Fractured left forearm 1962, fractured right ankle 1971, left inguinal herniorrhaphy 1995.  Family history: Father died at age 32 with history of coronary artery disease, esophageal and colon cancer. Mother with history of stroke and colon cancer deceased. 2 sisters in good health.  Social history: He is married. Completed 2 years of college. 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  They live near [place]          Review of Systems  Constitutional: Negative for fatigue.  Respiratory: Negative.   Cardiovascular: Negative.   Gastrointestinal:       Still has issues with diarrhea but improved  Genitourinary: Negative.   Musculoskeletal: Negative.   Neurological: Negative.   Psychiatric/Behavioral: Negative.        Objective:   Physical Exam  Constitutional: He is oriented to person, place, and time. He appears well-developed and well-nourished. No distress.  HENT:  Head: Normocephalic and atraumatic.  Right Ear: External ear normal.  Left Ear: External ear normal.  Mouth/Throat: Oropharynx is clear and moist. No oropharyngeal exudate.  Eyes: Pupils are equal, round, and reactive to light. Conjunctivae and EOM are normal. Right eye exhibits no discharge. Left eye exhibits no discharge. No scleral icterus.  Neck: Neck supple. No JVD present. No thyromegaly present.  Abdominal: Soft. Bowel sounds are normal. He exhibits no distension and no mass.  There is no tenderness. There is no rebound and no guarding.  Genitourinary:  Genitourinary Comments: Prostate exam deferred until next visit at patient request. PSA drawn  Musculoskeletal: He exhibits no edema.  Neurological: He is alert and oriented to person, place, and time. He has normal reflexes. No cranial nerve deficit. Coordination normal.  Skin: Skin is warm and dry. No rash noted. He is not diaphoretic.  Psychiatric: He has a normal mood and affect.  His behavior is normal. Judgment and thought content normal.          Assessment & Plan:  Ulcerative colitis-being treated by gastroenterologist  Central chorioretinopathy-seen at [facility] clinic  GE reflux  Iron deficiency anemia-markedly improved with iron supplement and treatment for ulcerative colitis  History of sleep apnea  History of low back pain due to degenerative disc disease  Plan: He will receive flu vaccine in the fall. Have recommended prostate exam at next visit. Hopefully ulcerative colitis will be under even better control with that time but he has improved. Labs pending.

## 2016-08-04 NOTE — Patient Instructions (Signed)
It was a pleasure to see you today. Continue iron once daily for history of iron deficiency anemia secondary to colitis. Follow-up prostate exam next visit.

## 2016-08-05 LAB — COMPLETE METABOLIC PANEL WITH GFR
ALT: 13 U/L (ref 9–46)
AST: 20 U/L (ref 10–35)
Albumin: 4 g/dL (ref 3.6–5.1)
Alkaline Phosphatase: 47 U/L (ref 40–115)
BUN: 12 mg/dL (ref 7–25)
CO2: 21 mmol/L (ref 20–31)
Calcium: 9.7 mg/dL (ref 8.6–10.3)
Chloride: 103 mmol/L (ref 98–110)
Creat: 0.87 mg/dL (ref 0.70–1.25)
GFR, Est African American: >89 mL/min (ref >=60)
GFR, Est Non African American: >89 mL/min (ref >=60)
Glucose, Bld: 83 mg/dL (ref 65–99)
Potassium: 4.4 mmol/L (ref 3.5–5.3)
Sodium: 141 mmol/L (ref 135–146)
Total Bilirubin: 0.5 mg/dL (ref 0.2–1.2)
Total Protein: 7.4 g/dL (ref 6.1–8.1)

## 2016-08-05 LAB — LIPID PANEL
Cholesterol: 233 mg/dL — ABNORMAL HIGH (ref <200)
HDL: 95 mg/dL (ref >40)
LDL Cholesterol: 120 mg/dL — ABNORMAL HIGH (ref <100)
Total CHOL/HDL Ratio: 2.5 Ratio (ref <5.0)
Triglycerides: 92 mg/dL (ref <150)
VLDL: 18 mg/dL (ref <30)

## 2016-08-05 LAB — PSA: PSA: 1.1 ng/mL (ref <=4.0)

## 2016-08-21 DIAGNOSIS — G4733 Obstructive sleep apnea (adult) (pediatric): Secondary | ICD-10-CM | POA: Diagnosis not present

## 2016-09-01 DIAGNOSIS — G4733 Obstructive sleep apnea (adult) (pediatric): Secondary | ICD-10-CM | POA: Diagnosis not present

## 2016-09-14 ENCOUNTER — Other Ambulatory Visit: Payer: Self-pay

## 2016-09-14 ENCOUNTER — Telehealth: Payer: Self-pay | Admitting: Internal Medicine

## 2016-09-14 DIAGNOSIS — K51511 Left sided colitis with rectal bleeding: Secondary | ICD-10-CM

## 2016-09-14 MED ORDER — MESALAMINE 1.2 G PO TBEC: 4.8000 g | DELAYED_RELEASE_TABLET | Freq: Every day | ORAL | 3 refills | Status: DC

## 2016-09-14 NOTE — Telephone Encounter (Signed)
Spoke with pt and he is aware and knows to keep his OV as scheduled in October.

## 2016-09-14 NOTE — Telephone Encounter (Signed)
Should only use Uceris in the event of colitis symptoms or recurrent flare (diarrhea, rectal bleeding, left sided or lower abd pain).  This would not be a chronic med but used in 8 week periods when needed to control flares The only chronic, daily med for colitis at present would be the Lialda, 4.8 g daily. If Uceris is needed frequently we would escalate medical therapy with biologics.  The ultimate goal is to control inflammation and colitis symptoms without steroids

## 2016-09-14 NOTE — Telephone Encounter (Signed)
Pt states he saw his eye doctor at [facility] and they decided he would stay on Uceris. He refilled the bottle again and wants to know if he still needs to be taking the Uceris. He also needs a refill on his mesalamine. OV scheduled for pt to be seen in October to discuss colon per last OV note. Please advise regarding the Uceris.

## 2016-09-15 DIAGNOSIS — H35713 Central serous chorioretinopathy, bilateral: Secondary | ICD-10-CM | POA: Diagnosis not present

## 2016-09-15 DIAGNOSIS — H43811 Vitreous degeneration, right eye: Secondary | ICD-10-CM | POA: Diagnosis not present

## 2016-09-27 ENCOUNTER — Telehealth: Payer: Self-pay | Admitting: *Deleted

## 2016-09-27 DIAGNOSIS — K51511 Left sided colitis with rectal bleeding: Secondary | ICD-10-CM

## 2016-09-27 NOTE — Telephone Encounter (Signed)
We were asked and advised by his eye MD at [facility] to stop all steroids as this was worsening his eye condition putting him at higher risk of progression of eye disease and loss of vision. He was on [place] and now off of Uceris (steroid) he is worsening as before. Known left-sided colitis. I would recommend Humira and the next treatment option in addition to Lialda. He will need CBC, CMP, Hep B surf Ag and Ab, Hep B core total, Hep C ab, quantiferon Gold  Please have him come by and pick up or mail him information on Humira. Will we need to discuss risk ( risk of infection (including reactivation of latent TB and underlying viral hepatitis), hepatotoxicity, rash, malignancy (specifically lymphoma), demyelinating disease, and even heart failure) at office visit and this can be with me or APP (he has seen Amy Esterwood in the recent past and thus would schedule with her if not with me) Thanks

## 2016-09-27 NOTE — Telephone Encounter (Addendum)
Dr Hilarie Fredrickson has reviewed Dr Pryor Ochoa note ([facility]) from 09/15/16 and writes "note from eye MD received. He recommends avoiding steroids, even budesonide. Please discontinue this Rx. Needs office visit to discuss change in therapy."   Patient is already scheduled for office visit with Dr Hilarie Fredrickson on 11/09/16 so he should keep that appointment. I have left a voicemail for patient to call back to discuss this information.

## 2016-09-27 NOTE — Telephone Encounter (Signed)
I have spoken to patient to give Dr Vena Rua additional recommendations. He has scheduled an appointment to see Nicoletta Ba on 10/03/16 at 1:30 pm. He will get patient information at that time. Patient plans to come tomorrow for labwork.

## 2016-09-27 NOTE — Telephone Encounter (Signed)
Patient has been advised of Dr Vena Rua recommendations. However, he states that he has very recently been having 4-5 bowel movements daily with significant amount of blood, mostly in morning stool. No abdominal pain. Having some chest pain which he relates to gas. Although he is having diarrhea, he "feels constipated." Patient discontinue Uceris on 09/15/16 but remains on Lialda, 4 tablets daily. Would you like him to be worked in to office schedule with you or see APP sooner? I advised we may need him to come for labs etc or have him escalate therapy to which he verbalizes understanding.

## 2016-09-28 ENCOUNTER — Other Ambulatory Visit (INDEPENDENT_AMBULATORY_CARE_PROVIDER_SITE_OTHER): Payer: 59

## 2016-09-28 DIAGNOSIS — K51511 Left sided colitis with rectal bleeding: Secondary | ICD-10-CM

## 2016-09-28 LAB — CBC WITH DIFFERENTIAL/PLATELET
Basophils Absolute: 0.1 10*3/uL (ref 0.0–0.1)
Basophils Relative: 0.9 % (ref 0.0–3.0)
Eosinophils Absolute: 0.1 10*3/uL (ref 0.0–0.7)
Eosinophils Relative: 1.8 % (ref 0.0–5.0)
HCT: 41.4 % (ref 39.0–52.0)
Hemoglobin: 13.8 g/dL (ref 13.0–17.0)
Lymphocytes Relative: 29.5 % (ref 12.0–46.0)
Lymphs Abs: 2.1 10*3/uL (ref 0.7–4.0)
MCHC: 33.4 g/dL (ref 30.0–36.0)
MCV: 94.4 fl (ref 78.0–100.0)
Monocytes Absolute: 0.6 10*3/uL (ref 0.1–1.0)
Monocytes Relative: 8 % (ref 3.0–12.0)
Neutro Abs: 4.2 10*3/uL (ref 1.4–7.7)
Neutrophils Relative %: 59.8 % (ref 43.0–77.0)
Platelets: 277 10*3/uL (ref 150.0–400.0)
RBC: 4.38 Mil/uL (ref 4.22–5.81)
RDW: 13.4 % (ref 11.5–15.5)
WBC: 7 10*3/uL (ref 4.0–10.5)

## 2016-09-28 LAB — COMPREHENSIVE METABOLIC PANEL
ALT: 13 U/L (ref 0–53)
AST: 21 U/L (ref 0–37)
Albumin: 4.3 g/dL (ref 3.5–5.2)
Alkaline Phosphatase: 53 U/L (ref 39–117)
BUN: 12 mg/dL (ref 6–23)
CO2: 26 mEq/L (ref 19–32)
Calcium: 9.7 mg/dL (ref 8.4–10.5)
Chloride: 100 mEq/L (ref 96–112)
Creatinine, Ser: 0.83 mg/dL (ref 0.40–1.50)
GFR: 99.68 mL/min (ref >60.00)
Glucose, Bld: 91 mg/dL (ref 70–99)
Potassium: 3.9 mEq/L (ref 3.5–5.1)
Sodium: 136 mEq/L (ref 135–145)
Total Bilirubin: 0.7 mg/dL (ref 0.2–1.2)
Total Protein: 7.3 g/dL (ref 6.0–8.3)

## 2016-09-30 LAB — QUANTIFERON TB GOLD ASSAY (BLOOD)
Mitogen-Nil: >10 IU/mL
QUANTIFERON(R)-TB GOLD: NEGATIVE
Quantiferon Nil Value: 0.04 IU/mL
Quantiferon Tb Ag Minus Nil Value: <0 IU/mL

## 2016-09-30 LAB — HEPATITIS B SURFACE ANTIGEN: Hepatitis B Surface Ag: NONREACTIVE

## 2016-09-30 LAB — HEPATITIS B SURFACE ANTIBODY,QUALITATIVE: Hep B S Ab: NONREACTIVE

## 2016-09-30 LAB — HEPATITIS C ANTIBODY
Hepatitis C Ab: NONREACTIVE
SIGNAL TO CUT-OFF: 0.01 (ref <1.00)

## 2016-09-30 LAB — HEPATITIS B CORE ANTIBODY, TOTAL: Hep B Core Total Ab: NONREACTIVE

## 2016-10-03 ENCOUNTER — Encounter: Payer: Self-pay | Admitting: Physician Assistant

## 2016-10-03 ENCOUNTER — Other Ambulatory Visit (INDEPENDENT_AMBULATORY_CARE_PROVIDER_SITE_OTHER): Payer: 59

## 2016-10-03 ENCOUNTER — Ambulatory Visit (INDEPENDENT_AMBULATORY_CARE_PROVIDER_SITE_OTHER): Payer: 59 | Admitting: Physician Assistant

## 2016-10-03 VITALS — BP 128/84 | HR 72 | Ht 70.0 in | Wt 202.5 lb

## 2016-10-03 DIAGNOSIS — Z862 Personal history of diseases of the blood and blood-forming organs and certain disorders involving the immune mechanism: Secondary | ICD-10-CM

## 2016-10-03 DIAGNOSIS — K51818 Other ulcerative colitis with other complication: Secondary | ICD-10-CM

## 2016-10-03 DIAGNOSIS — K5730 Diverticulosis of large intestine without perforation or abscess without bleeding: Secondary | ICD-10-CM

## 2016-10-03 DIAGNOSIS — K2270 Barrett's esophagus without dysplasia: Secondary | ICD-10-CM | POA: Diagnosis not present

## 2016-10-03 DIAGNOSIS — K51511 Left sided colitis with rectal bleeding: Secondary | ICD-10-CM

## 2016-10-03 LAB — HIGH SENSITIVITY CRP: CRP, High Sensitivity: 1.02 mg/L (ref 0.000–5.000)

## 2016-10-03 LAB — FERRITIN: Ferritin: 22.2 ng/mL (ref 22.0–322.0)

## 2016-10-03 MED ORDER — MESALAMINE 1.2 G PO TBEC: DELAYED_RELEASE_TABLET | ORAL | 6 refills | Status: DC

## 2016-10-03 NOTE — Progress Notes (Addendum)
Subjective:    Patient ID: James [name], [name]    DOB: Sep 10, 1955, 61 y.o.   MRN: [id number]  HPI James "James [name]" is a pleasant 61 year old white [name], known to Dr. Hilarie [name] who was just recently diagnosed with left-sided ulcerative colitis at the time of colonoscopy earlier this summer. He was started on Lialda 4.8 g daily and Uceris  9 mg by mouth daily. He comes in today to discuss alternative therapy as he has had a recent visit with his ophthalmologist at [facility] who recommends that he completely avoid steroid therapy including budesonide. Patient has history of a central chorio retinopathy and has had vitreous detachment of the right eye. Other medical problems include osteoarthritis ,status post right hip replacement, GERD and history of adenomatous polyps. Patient had definite improvement on Lialda and Uceriswhich he stopped about 2 weeks ago. He says at its worse she was having 8-10 bowel movements per day all diarrheal stool with blood and clots. Currently he is having 2-4 loose bowel movements per day denies any abdominal pain has had some tenesmus type symptoms. He says he still seeing small amounts of blood and every now and then a bowel movement with a bit more blood. Patient came in for labs prior to this office visit which included hepatitis B and C serologies which were negative, hepatitis C antibody nonreactive QuantiFERON Gold also negative, hemoglobin 13.8, hematocrit of 41.4, platelets 277 and CMET entirely normal. He has been iron deficient and is on oral iron replacement as well.  Review of Systems Pertinent positive and negative review of systems were noted in the above HPI section.  All other review of systems was otherwise negative.  Outpatient Encounter Prescriptions as of 10/03/2016  Medication Sig  . mesalamine (LIALDA) 1.2 g EC tablet Take 2 tab twice daily  . Multiple Vitamin (MULTIVITAMIN) tablet Take 1 tablet by mouth daily.  Marland Kitchen oxymetazoline (AFRIN 12 HOUR) 0.05 %  nasal spray Place 2 sprays into both nostrils at bedtime.  . pantoprazole (PROTONIX) 40 MG tablet Take 1 tablet (40 mg total) by mouth daily.  . [DISCONTINUED] mesalamine (LIALDA) 1.2 g EC tablet Take 4 tablets (4.8 g total) by mouth daily with breakfast.  . Budesonide 9 MG TB24 Take 9 mg by mouth daily. (Patient not taking: Reported on 10/03/2016)  . [DISCONTINUED] 0.9 %  sodium chloride infusion    No facility-administered encounter medications on file as of 10/03/2016.    No Known Allergies Patient Active Problem List   Diagnosis Date Noted  . Barrett's esophagus 05/30/2016  . OSA (obstructive sleep apnea) 12/23/2015  . Exertional dyspnea 12/23/2015  . 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 Status post total replacement of right hip 03/14/2014  . Osteoarthritis of right hip 03/04/2014  . Allergic rhinitis 03/04/2014  . History of adenomatous polyp of colon 08/19/2011  . Left sided colitis (Tool) 08/19/2011  . GE reflux 11/14/2010  . Low back pain 11/14/2010  . History of smoking 11/14/2010  . Hyperlipidemia 11/14/2010   Social History   Social History  . Marital status: Married    Spouse name: N/A  . Number of children: 5  . Years of education: N/A   Occupational History  . Printing    Social History Main Topics  . Smoking status: Former Smoker    Packs/day: 0.25    Years: 30.00    Types: Cigarettes    Quit date: 03/09/2014  . Smokeless tobacco: Never Used     Comment: Counseling sheet given  08-2011  . Alcohol use 4.2 oz/week    7 Standard drinks or equivalent per week     Comment: week  . Drug use: No  . Sexual activity: Not on file   Other Topics Concern  . Not on file   Social History Narrative  . No narrative on file    James [name]'s family history includes Colon cancer in his mother; Colon cancer (age of onset: 63) in his father; Esophageal cancer in his father; Heart disease in his father.      Objective:    Vitals:   10/03/16 1336  BP: 128/84    Pulse: 72    Physical Exam  well-developed older white [name] in no acute distress, accompanied by his wife both pleasant blood pressure 128/84 pulse 72, Height 5 foot 10, weight 202, BMI 29.0. HEENT; nontraumatic normocephalic EOMI PERRLA sclera anicteric, Cardiovascular; regular rate and rhythm with S1-S2 no murmur or gallop, Pulmonary ;clear bilaterally, Abdomen ;soft, bowel sounds are present and basically nontender there is no palpable mass or hepatosplenomegaly, Rectal ;exam not done, Extremities; no clubbing cyanosis or edema skin warm and dry, Neuropsych; mood and affect appropriate       Assessment & Plan:   #62 61 year old white [name] with new diagnosis of left-sided ulcerative colitis symptomatically improved on Lialda in combination with Uceris 9 mg by mouth daily. Symptoms had significantly improved but had not completely resolved. Patient has been off Uceris over the past 2 weeks with no significant change. He is no longer a candidate for any type of steroid therapy including budesonide because of underlying chronic bilateral central chorioretinopathy. Colitis has not gone into remission with mesalamine and budesonide, and therefore is felt he will require biologic therapy  #2 osteoarthritis status post right hip replacement #3 history of adenomatous colon polyps-up-to-date with colonoscopy last exam May 2018 with one sessile serrated polyp #4 diverticulosis #5 Barrett's esophagus-no dysplasia recent EGD done in May 2018 plan follow-up surveillance in 3 years  Plan; Initial discussion with patient today regarding biologic therapy. We discussed Humira versus Remicade. We discussed potential increased risk of infections including reactivation of latent TB, and underlying viral hepatitis, hepatotoxicity, malignancy, specifically lymphoma, and heart failure with Biologics. Patient would like to be initiated on Humira if his insurance will cover. He has had previous shingles vaccine with  Zostavax in 2016 per his PCP. We will set him up for nurse visit here for Pneumovax and initiation of Twinrix. Continue Lialda 4.8 g daily Continue oral iron supplementation for now Check ferritin today and CRP. Patient was given patient information packet on Humira. We will contact him once medication has been approved. Dr. Vena Rua notes indicate initial  plan for follow-up colonoscopy in 6 months. Will  discuss with Dr. Hilarie [name] regarding timing.  Amy S Esterwood PA-C 10/03/2016   Cc: Elby Showers, MD  Addendum: Reviewed and agree with management. Agree with vaccines Agree with Humira induction and 5-asa therapy Pyrtle, Lajuan Lines, MD

## 2016-10-03 NOTE — Patient Instructions (Addendum)
Please go to the basement level to have your labs drawn.  Continue Lialda 1.2 grams, take 2 tablets twice daily. We sent refills to [facility] [address].   Take Gas X- as needed.   Loda Bialas will be calling you to come in for the vaccines. Should be this week.   We have provided you with Humira information.    If you are age 62 or younger, your body mass index should be between 19-25. Your Body mass index is 29.06 kg/m. If this is out of the aformentioned range listed, please consider follow up with your Primary Care Provider.

## 2016-10-04 ENCOUNTER — Telehealth: Payer: Self-pay | Admitting: Internal Medicine

## 2016-10-04 NOTE — Telephone Encounter (Signed)
Patient would like to know if the office have the following vaccines in:  Pneumo Vax, Hep A&B Twin Rex (excellerated dose) and the flu shot.

## 2016-10-04 NOTE — Telephone Encounter (Signed)
We do not carry the Hep A and B and pt is aware he said they have it at [facility] he just wanted it done here if he could.

## 2016-10-05 ENCOUNTER — Ambulatory Visit (INDEPENDENT_AMBULATORY_CARE_PROVIDER_SITE_OTHER): Payer: 59 | Admitting: Internal Medicine

## 2016-10-05 ENCOUNTER — Encounter: Payer: Self-pay | Admitting: Internal Medicine

## 2016-10-05 ENCOUNTER — Other Ambulatory Visit: Payer: Self-pay

## 2016-10-05 DIAGNOSIS — K51919 Ulcerative colitis, unspecified with unspecified complications: Secondary | ICD-10-CM

## 2016-10-05 DIAGNOSIS — Z23 Encounter for immunization: Secondary | ICD-10-CM | POA: Diagnosis not present

## 2016-10-05 DIAGNOSIS — K51818 Other ulcerative colitis with other complication: Secondary | ICD-10-CM

## 2016-10-05 NOTE — Progress Notes (Signed)
Dr Hilarie Fredrickson can you please sign off on these injections?

## 2016-10-05 NOTE — Telephone Encounter (Signed)
Called the patient and he will come today, 9-19 around 3:00 to 4:00 PM for his #1 Twinrex injections, pneumovax, and flu vaccine. I told him to ask for me.

## 2016-10-05 NOTE — Patient Instructions (Signed)
Pt tolerated injection(s) well.

## 2016-10-11 ENCOUNTER — Telehealth: Payer: Self-pay | Admitting: Internal Medicine

## 2016-10-11 NOTE — Telephone Encounter (Signed)
I need more info what medication? what problem?

## 2016-10-11 NOTE — Telephone Encounter (Signed)
Noted  

## 2016-10-11 NOTE — Telephone Encounter (Signed)
Patient would like to know when he can come in and have a consultation with the doctor about medication.

## 2016-10-11 NOTE — Telephone Encounter (Signed)
[facility] states humira was approved, and they will reach out to patient for delivery.

## 2016-10-12 NOTE — Telephone Encounter (Signed)
Pt wanted to talk about Humira, he is not currently on it but would like to try it and Dr Hilarie Fredrickson sent this in. He did not want to start until he talked to you. Please advise when you believe the pt could come in to discuss this

## 2016-10-12 NOTE — Telephone Encounter (Signed)
Pt is aware.  

## 2016-10-12 NOTE — Telephone Encounter (Signed)
I have no experience with Humira usually prescribed by Rheumatologist, oncologist or Gastroenterologist. All I know about it is on line and he can review that info. I would have to defer to Dr. Hilarie Fredrickson about whether or not he should take this.

## 2016-10-12 NOTE — Telephone Encounter (Signed)
LMTCB

## 2016-10-18 ENCOUNTER — Telehealth: Payer: Self-pay | Admitting: Physician Assistant

## 2016-10-18 NOTE — Telephone Encounter (Signed)
Discussed. He is asking about the Twinrix vaccine which he had #1 of 3 on 10/05/16. He received a flu vaccine and a pneumonia vaccine on 10/05/16. He was unclear on what he was to receive next and when. He is on the standard vaccination schedule with Hep A and Hep B. He will be due the 2nd of 3 injections on or after the 19th of October. He is scheduled to see Dr Gemma Payor on 11/09/16. He elects to receive the vaccine at that visit.

## 2016-10-19 ENCOUNTER — Telehealth: Payer: Self-pay | Admitting: Internal Medicine

## 2016-10-19 NOTE — Telephone Encounter (Signed)
Pt has been approved for Humira. He states he does not want to start the Humira yet because he is feeling better, also states he didn't think he was supposed to start if while he was getting the vaccines. Explained that patients are on biologics while getting twinrix vaccines and that they are given in a series. Pt states he is feeling better and doesn't feel he needs to start it right now. Wants to wait and discuss further with Dr. Hilarie Fredrickson at his [place]. Dr. Hilarie Fredrickson notified.

## 2016-11-09 ENCOUNTER — Encounter: Payer: Self-pay | Admitting: Internal Medicine

## 2016-11-09 ENCOUNTER — Ambulatory Visit (INDEPENDENT_AMBULATORY_CARE_PROVIDER_SITE_OTHER): Payer: 59 | Admitting: Internal Medicine

## 2016-11-09 VITALS — BP 114/78 | HR 72 | Ht 70.57 in | Wt 203.4 lb

## 2016-11-09 DIAGNOSIS — Z7189 Other specified counseling: Secondary | ICD-10-CM

## 2016-11-09 DIAGNOSIS — K51519 Left sided colitis with unspecified complications: Secondary | ICD-10-CM | POA: Diagnosis not present

## 2016-11-09 DIAGNOSIS — D500 Iron deficiency anemia secondary to blood loss (chronic): Secondary | ICD-10-CM | POA: Diagnosis not present

## 2016-11-09 DIAGNOSIS — Z23 Encounter for immunization: Secondary | ICD-10-CM

## 2016-11-09 MED ORDER — CIPROFLOXACIN HCL 500 MG PO TABS: 500.0000 mg | ORAL_TABLET | Freq: Two times a day (BID) | ORAL | 0 refills | Status: DC

## 2016-11-09 NOTE — Progress Notes (Signed)
Subjective:    Patient ID: James [name], male    DOB: 1955-03-13, 61 y.o.   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 He was seen by James Ba, PA-C on 10/05/2016 to discuss initiation of Humira therapy. Back in May 2018 he was found to have moderate left-sided colitis causing blood and mucoid stools. He had been treated with steroids, specifically budesonide which had dramatically improved his symptoms in addition to Lialda 4.8 g daily. However due to his chorioretinopathy steroids are strictly contraindicated and can worsen this process and lead to blindness. His ophthalmologist has strongly recommended he avoid all steroid therapy if possible. He had taken 2-1/2 months of Uceris but then stopped.  Now he is taking Lialda 2.4 g twice a day. He also is using oral iron on most days. He continues pantoprazole 40 mg daily.  He received his flu vaccine, Pneumovax and first TwinRx  He reports that his colitis symptoms are significantly better. He has changed his diet. He is avoiding skins of meat, fruits, vegetables and also avoiding raw fruits and vegetables. He states that he is seeing very infrequent blood with wiping and very occasional blood with stool. Stools for the most part are formed though not fully solid and occurring about 2 times per day. He has never had abdominal pain nor does he now.  He and his wife has discussed Humira therapy. He was approved for this medication but wished to discuss more. He does have concerns about long-term risk versus benefit.  Heartburn is well controlled pantoprazole. Denies dysphagia and odynophagia.   Review of Systems As per James of present illness, otherwise negative  Current Medications, Allergies, Past Medical James, Past Surgical James, Family  James and Social James were reviewed in [employer] record.     Objective:   Physical Exam BP 114/78 (BP Location: Left Arm, Patient Position: Sitting, Cuff Size: Normal)   Pulse 72   Ht 5' 10.57" (1.793 m) Comment: height measured without shoes  Wt 203 lb 6 oz (92.3 kg)   BMI 28.71 kg/m  Constitutional: Well-developed and well-nourished. No distress. HEENT: Normocephalic and atraumatic.Conjunctivae are normal.  No scleral icterus. Neck: Neck supple. Trachea midline. Cardiovascular: Normal rate, regular rhythm and intact distal pulses. No M/R/G Pulmonary/chest: Effort normal and breath sounds normal. No wheezing, rales or rhonchi. Abdominal: Soft, nontender, nondistended. Bowel sounds active throughout. There are no masses palpable. No hepatosplenomegaly. Extremities: no clubbing, cyanosis, or edema Neurological: Alert and oriented to person place and time. Skin: Skin is warm and dry. Psychiatric: Normal mood and affect. Behavior is normal.  CBC    Component Value Date/Time   WBC 7.0 09/28/2016 1600   RBC 4.38 09/28/2016 1600   HGB 13.8 09/28/2016 1600   HCT 41.4 09/28/2016 1600   PLT 277.0 09/28/2016 1600   MCV 94.4 09/28/2016 1600   MCH 29.8 06/16/2016 1242   MCHC 33.4 09/28/2016 1600   RDW 13.4 09/28/2016 1600   LYMPHSABS 2.1 09/28/2016 1600   MONOABS 0.6 09/28/2016 1600   EOSABS 0.1 09/28/2016 1600   BASOSABS 0.1 09/28/2016 1600   CMP     Component Value Date/Time   NA 136 09/28/2016 1600   K 3.9 09/28/2016 1600   CL 100 09/28/2016 1600   CO2 26 09/28/2016 1600   GLUCOSE 91  09/28/2016 1600   BUN 12 09/28/2016 1600   CREATININE 0.83 09/28/2016 1600   CREATININE 0.87 08/04/2016 1049   CALCIUM 9.7 09/28/2016 1600   PROT 7.3 09/28/2016 1600   ALBUMIN 4.3 09/28/2016 1600   AST 21 09/28/2016 1600   ALT 13 09/28/2016 1600   ALKPHOS 53 09/28/2016 1600   BILITOT 0.7 09/28/2016 1600   GFRNONAA >89 08/04/2016 1049   GFRAA >89 08/04/2016  1049   Iron/TIBC/Ferritin/ %Sat    Component Value Date/Time   IRON 122 08/01/2016 1614   TIBC 362 06/16/2016 1242   FERRITIN 22.2 10/03/2016 1443   IRONPCTSAT 31.2 08/01/2016 1614   IRONPCTSAT 13 (L) 06/16/2016 1242      Assessment & Plan:  61 year old male with a James of left-sided colitis (dx 2018 by colonoscopy), James [name], James 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  1. Left-sided ulcerative colitis -- he has made improvement with Lialda but was requiring consistent steroid therapy throughout the last several months. We now know that it is of paramount importance to avoid steroids with his chorioretinopathy. We have discussed this together today. We spent a great degree time today discussing biologic therapy and the risks and benefits thereof. He does remain somewhat hesitant to start biologic therapy but understands the importance of mucosal healing and the risk long-term of chronic colitis. We discussed the risks of Humira including infection, his hepatitis serologies and TB test were negative though there is still a risk of infection on biologic therapy. We discussed the risk of lymphoma, demyelinating disease, rash, heart failure. After this discussion we made the decision to pursue flexible sigmoidoscopy to evaluate the degree of colitis. He has made dietary modifications and improve symptoms but my impression is that he has not achieved mucosal healing. This will be determined by flexible sigmoidoscopy and if colitis still present this gives [place] more evidence to proceed with Humira. We discussed the risk benefits and alternatives to flexible sigmoidoscopy and he wishes to proceed In the interim continue Lialda 4.8 g daily, he feels this does better in divided doses which is fine with me. He will continue 2.4 g twice a day He is up-to-date with flu vaccination and Pneumovax. He will get the second hepatitis A and B vaccine  today. He has also had the shingles vaccine.  2. Iron deficiency anemia -- overall improved at last check. Can likely discontinue iron therapy. This will need to be followed.  3. Plans for [place] travel -- he is going to go to [place] for nearly 2 weeks in late November. He's had traveler's diarrhea while visiting there before. We'll give him a prescription for Cipro 500 mg twice a day to use for 7 days should he develop fever or diarrhea lasting longer than 24 hours while he is there.  4. GERD with Barrett's [name] -- continue pantoprazole 40 mg daily  40 minutes spent with the patient today. Greater than 50% was spent in counseling and coordination of care with the patient

## 2016-11-09 NOTE — Patient Instructions (Addendum)
You have been scheduled for a flexible sigmoidoscopy. Please follow the written instructions given to you at your visit today. If you use inhalers (even only as needed), please bring them with you on the day of your procedure.  Continue Lialda 4.8 grams daily  We have sent the following medications to your pharmacy for you to pick up at your convenience: Cipro 500 mg twice daily x 7 days ONLY if diarrhea while in [place]  We have given you the 2nd of your 3 twinrix injections today.  If you are age 62 or older, your body mass index should be between 23-30. Your Body mass index is 28.71 kg/m. If this is out of the aforementioned range listed, please consider follow up with your Primary Care Provider.  If you are age 87 or younger, your body mass index should be between 19-25. Your Body mass index is 28.71 kg/m. If this is out of the aformentioned range listed, please consider follow up with your Primary Care Provider.

## 2016-11-17 ENCOUNTER — Encounter: Payer: Self-pay | Admitting: Internal Medicine

## 2016-11-17 ENCOUNTER — Ambulatory Visit (AMBULATORY_SURGERY_CENTER): Payer: 59 | Admitting: Internal Medicine

## 2016-11-17 VITALS — BP 123/74 | HR 54 | Temp 97.8°F | Resp 12 | Ht 70.5 in | Wt 203.0 lb

## 2016-11-17 DIAGNOSIS — K51511 Left sided colitis with rectal bleeding: Secondary | ICD-10-CM | POA: Diagnosis present

## 2016-11-17 DIAGNOSIS — K529 Noninfective gastroenteritis and colitis, unspecified: Secondary | ICD-10-CM | POA: Diagnosis not present

## 2016-11-17 DIAGNOSIS — D125 Benign neoplasm of sigmoid colon: Secondary | ICD-10-CM

## 2016-11-17 DIAGNOSIS — K635 Polyp of colon: Secondary | ICD-10-CM

## 2016-11-17 MED ORDER — SODIUM CHLORIDE 0.9 % IV SOLN: 500.0000 mL | INTRAVENOUS | Status: DC

## 2016-11-17 MED ORDER — MESALAMINE 1000 MG RE SUPP: 1000.0000 mg | Freq: Every day | RECTAL | 11 refills | Status: DC

## 2016-11-17 NOTE — Op Note (Signed)
[facility] Patient Name: Donold Marotto Procedure Date: 11/17/2016 10:09 AM MRN: [id number] Endoscopist: Jerene Bears , MD Age: 61 Referring MD:  Date of Birth: 10/15/1955 Gender: Male Account #: [id number] Procedure:                Flexible Sigmoidoscopy Indications:              Assess therapeutic response to therapy of                            left-sided chronic ulcerative colitis, moderate to                            severe inflammation May 2018 in the left colon to                            40 cm. Medicines:                Monitored Anesthesia Care Procedure:                Pre-Anesthesia Assessment:                           - Prior to the procedure, a History and Physical                            was performed, and patient medications and                            allergies were reviewed. The patient's tolerance of                            previous anesthesia was also reviewed. The risks                            and benefits of the procedure and the sedation                            options and risks were discussed with the patient.                            All questions were answered, and informed consent                            was obtained. Prior Anticoagulants: The patient has                            taken no previous anticoagulant or antiplatelet                            agents. ASA Grade Assessment: II - A patient with                            mild systemic disease. After reviewing the risks  and benefits, the patient was deemed in                            satisfactory condition to undergo the procedure.                           After obtaining informed consent, the scope was                            passed under direct vision. The Colonoscope was                            introduced through the anus and advanced to the the                            splenic flexure. The flexible sigmoidoscopy was                        accomplished without difficulty. The patient                            tolerated the procedure well. The quality of the                            bowel preparation was good. Scope In: Scope Out: Findings:                 The digital rectal exam was normal.                           Normal mucosa was found in the mid and proximal                            rectum, in the distal and proximal sigmoid colon                            and in the descending colon. This was biopsied with                            a cold forceps for histology. This is markedly                            improved compared to colonoscopy in May 2018.                           Localized mild inflammation characterized by                            altered vascularity, erythema, friability and                            granularity was found in the distal rectum. This                            inflammation was seen  in the first 3-4 cm of distal                            rectum extending from the dentate line. Biopsies                            were taken with a cold forceps for histology.                           Localized mild inflammation characterized by                            erosions and erythema was found in the sigmoid                            colon. This inflammation was very focal and                            involved only one fold in the sigmoid around 25 cm                            from the dentate line. Multiple biopsies.                           A 6 mm polyp was found in the sigmoid colon. The                            polyp was sessile. The polyp was removed with a                            cold snare. Resection and retrieval were complete.                           Multiple small and large-mouthed diverticula were                            found in the sigmoid colon and descending colon. Complications:            No immediate complications. Estimated Blood  Loss:     Estimated blood loss was minimal. Impression:               - Localized mild inflammation was found in the                            distal rectum, from dentate line to 4 cm,                            consistent with mild and improved inflammatory                            bowel disease. Biopsied.                           - Localized mild inflammation was  found in the                            sigmoid colon consistent with inflammatory bowel                            disease.                           - Normal mucosa in the rectum, in the sigmoid colon                            and in the descending colon. Biopsied.                           - Overall left-sided colitis is significantly                            improved as compared to May 2018.                           - 1 sigmoid polyp, resected and retrieved.                           - Diverticulosis. Recommendation:           - Patient has a contact number available for                            emergencies. The signs and symptoms of potential                            delayed complications were discussed with the                            patient. Return to normal activities tomorrow.                            Written discharge instructions were provided to the                            patient.                           - Resume previous diet.                           - Continue present medications.                           - Await pathology results.                           - Office follow-up in 3 months, if not already                            scheduled. Jerene Bears, MD 11/17/2016 10:46:07 AM  This report has been signed electronically.

## 2016-11-17 NOTE — Progress Notes (Signed)
To recovery, report to RN, VSS. 

## 2016-11-17 NOTE — Progress Notes (Signed)
No problems noted in the recovery room. Maw   Vocal order from Dr. Marissa Calamity supp #30 1 per rectum Qhs.  Refill x11 sent electronicaly to [facility].  A co-pay card was given to pt's wife.  maw

## 2016-11-17 NOTE — Patient Instructions (Signed)
YOU HAD AN ENDOSCOPIC PROCEDURE TODAY AT [facility]:   Refer to the procedure report that was given to you for any specific questions about what was found during the examination.  If the procedure report does not answer your questions, please call your gastroenterologist to clarify.  If you requested that your care partner not be given the details of your procedure findings, then the procedure report has been included in a sealed envelope for you to review at your convenience later.  YOU SHOULD EXPECT: Some feelings of bloating in the abdomen. Passage of more gas than usual.  Walking can help get rid of the air that was put into your GI tract during the procedure and reduce the bloating. If you had a lower endoscopy (such as a colonoscopy or flexible sigmoidoscopy) you may notice spotting of blood in your stool or on the toilet paper. If you underwent a bowel prep for your procedure, you may not have a normal bowel movement for a few days.  Please Note:  You might notice some irritation and congestion in your nose or some drainage.  This is from the oxygen used during your procedure.  There is no need for concern and it should clear up in a day or so.  SYMPTOMS TO REPORT IMMEDIATELY:   Following lower endoscopy (colonoscopy or flexible sigmoidoscopy):  Excessive amounts of blood in the stool  Significant tenderness or worsening of abdominal pains  Swelling of the abdomen that is new, acute  Fever of 100F or higher   For urgent or emergent issues, a gastroenterologist can be reached at any hour by calling [phone].   DIET:  We do recommend a small meal at first, but then you may proceed to your regular diet.  Drink plenty of fluids but you should avoid alcoholic beverages for 24 hours.  ACTIVITY:  You should plan to take it easy for the rest of today and you should NOT DRIVE or use heavy machinery until tomorrow (because of the sedation medicines used during the test).     FOLLOW UP: Our staff will call the number listed on your records the next business day following your procedure to check on you and address any questions or concerns that you may have regarding the information given to you following your procedure. If we do not reach you, we will leave a message.  However, if you are feeling well and you are not experiencing any problems, there is no need to return our call.  We will assume that you have returned to your regular daily activities without incident.  If any biopsies were taken you will be contacted by phone or by letter within the next 1-3 weeks.  Please call us at [phone] if you have not heard about the biopsies in 3 weeks.    SIGNATURES/CONFIDENTIALITY: You and/or your care partner have signed paperwork which will be entered into your electronic medical record.  These signatures attest to the fact that that the information above on your After Visit Summary has been reviewed and is understood.  Full responsibility of the confidentiality of this discharge information lies with you and/or your care-partner.    Handouts were given to your care partner on polyps and diverticulosis. Rx was sent to your pharmacy for canasa.  Co-pay card given to your wife. You may resume your current medications today. Await biopsy results. Please call if any questions or concerns.

## 2016-11-18 ENCOUNTER — Telehealth: Payer: Self-pay | Admitting: *Deleted

## 2016-11-18 NOTE — Telephone Encounter (Signed)
  Follow up Call-  Call back number 11/17/2016 05/24/2016  Post procedure Call Back phone  # [phone]  Permission to leave phone message Yes Yes  Some recent data might be hidden     Patient questions:  Do you have a fever, pain , or abdominal swelling? No. Pain Score  0 *  Have you tolerated food without any problems? Yes.    Have you been able to return to your normal activities? Yes.    Do you have any questions about your discharge instructions: Diet   No. Medications  No. Follow up visit  No.  Do you have questions or concerns about your Care? No.  Actions: * If pain score is 4 or above: No action needed, pain <4.

## 2016-11-22 ENCOUNTER — Encounter: Payer: Self-pay | Admitting: Internal Medicine

## 2016-12-05 DIAGNOSIS — G4733 Obstructive sleep apnea (adult) (pediatric): Secondary | ICD-10-CM | POA: Diagnosis not present

## 2016-12-28 ENCOUNTER — Ambulatory Visit: Payer: 59 | Admitting: Internal Medicine

## 2017-01-12 ENCOUNTER — Ambulatory Visit (INDEPENDENT_AMBULATORY_CARE_PROVIDER_SITE_OTHER): Payer: 59 | Admitting: Internal Medicine

## 2017-01-12 VITALS — BP 140/70 | HR 96 | Temp 101.4°F | Ht 70.5 in | Wt 204.0 lb

## 2017-01-12 DIAGNOSIS — B349 Viral infection, unspecified: Secondary | ICD-10-CM | POA: Diagnosis not present

## 2017-01-12 DIAGNOSIS — J329 Chronic sinusitis, unspecified: Secondary | ICD-10-CM

## 2017-01-12 MED ORDER — HYDROCODONE-HOMATROPINE 5-1.5 MG/5ML PO SYRP: 5.0000 mL | ORAL_SOLUTION | Freq: Three times a day (TID) | ORAL | 0 refills | Status: DC | PRN

## 2017-01-12 MED ORDER — CLARITHROMYCIN 500 MG PO TABS: 500.0000 mg | ORAL_TABLET | Freq: Two times a day (BID) | ORAL | 0 refills | Status: DC

## 2017-01-12 NOTE — Progress Notes (Signed)
   Subjective:    Patient ID: James [name], male    DOB: Nov 27, 1955, 61 y.o.   MRN: [id number]  HPI He is here today with fever chills and respiratory congestion.  He says a coworker was out earlier in the week with a similar illness.  His wife is been battling a respiratory infection for a few weeks since returning from [place].  He also has had several weeks of respiratory congestion but no fever or chills until today.  He has malaise and fatigue and does not feel well.  He has some cough.  He did take a flu vaccine earlier.    Review of Systems see above.  He has been diagnosed with inflammatory bowel disease but is reluctant to take Humira.  Complaining of some ear discomfort     Objective:   Physical Exam Skin warm and dry.  He has the sniffles.  TMs are clear. Pharynx is slightly injected without exudate.  Neck is supple without adenopathy.  Chest clear to auscultation without rales or wheezing      Assessment & Plan:  Acute viral syndrome  Probably has had sinusitis for a few weeks since returning from [place]  Plan: Biaxin 500 mg twice daily for 10 days.  Hycodan 1 teaspoon p.o. every 8 hours as needed cough.  Rest and drink plenty of fluids.  Tylenol as needed for fever.

## 2017-01-12 NOTE — Patient Instructions (Signed)
Biaxin 500 mg twice daily for 10 days.  Hycodan 1 teaspoon p.o. every 8 hours as needed cough.  Tylenol as needed for fever.  Rest and drink plenty of fluids.

## 2017-01-23 ENCOUNTER — Ambulatory Visit (INDEPENDENT_AMBULATORY_CARE_PROVIDER_SITE_OTHER): Payer: 59 | Admitting: Internal Medicine

## 2017-01-23 ENCOUNTER — Encounter: Payer: Self-pay | Admitting: Internal Medicine

## 2017-01-23 VITALS — BP 130/90 | HR 74 | Temp 98.0°F | Ht 70.5 in | Wt 201.0 lb

## 2017-01-23 DIAGNOSIS — M24111 Other articular cartilage disorders, right shoulder: Secondary | ICD-10-CM

## 2017-01-23 NOTE — Patient Instructions (Signed)
To see orthopedist regarding snapping scapula and ?  Lipoma

## 2017-01-23 NOTE — Progress Notes (Signed)
   Subjective:    Patient ID: James [name], male    DOB: Sep 09, 1955, 62 y.o.   MRN: [id number]  HPI For a while now, he cannot remember exactly how long he is noticed a popping sensation in his right scapula.  It is aggravating but not necessarily painful.  Recently he was demonstrating this to his wife and his wife noticed a lump about the size of a golf ball below his right scapular with a certain motion with his right upper extremity.  This was alarming to them.  He is concerned about a tumor.   Review of Systems see above     Objective:   Physical Exam  He has good range of motion of the right upper extremity.  I do see the lump below the right scapula when he raises his right arm up over his head and a bit of an angle.  It disappears rapidly.      Assessment & Plan:  ?  Lipoma-I suspect this is a benign issue.  I am not sure why his scapula snaps but suspect that is a mechanical issue.  Plan: He will see his orthopedist, Dr. Ninfa Linden for further evaluation.

## 2017-01-26 DIAGNOSIS — H35051 Retinal neovascularization, unspecified, right eye: Secondary | ICD-10-CM | POA: Diagnosis not present

## 2017-01-26 DIAGNOSIS — H35713 Central serous chorioretinopathy, bilateral: Secondary | ICD-10-CM | POA: Diagnosis not present

## 2017-01-31 ENCOUNTER — Encounter: Payer: Self-pay | Admitting: Pulmonary Disease

## 2017-01-31 ENCOUNTER — Ambulatory Visit: Payer: 59 | Admitting: Pulmonary Disease

## 2017-01-31 VITALS — BP 144/84 | HR 70 | Ht 72.0 in | Wt 204.4 lb

## 2017-01-31 DIAGNOSIS — J310 Chronic rhinitis: Secondary | ICD-10-CM | POA: Diagnosis not present

## 2017-01-31 DIAGNOSIS — G4733 Obstructive sleep apnea (adult) (pediatric): Secondary | ICD-10-CM

## 2017-01-31 DIAGNOSIS — T485X5A Adverse effect of other anti-common-cold drugs, initial encounter: Secondary | ICD-10-CM

## 2017-01-31 DIAGNOSIS — J309 Allergic rhinitis, unspecified: Secondary | ICD-10-CM

## 2017-01-31 DIAGNOSIS — Z9989 Dependence on other enabling machines and devices: Secondary | ICD-10-CM | POA: Diagnosis not present

## 2017-01-31 DIAGNOSIS — T485X1A Poisoning by other anti-common-cold drugs, accidental (unintentional), initial encounter: Secondary | ICD-10-CM

## 2017-01-31 NOTE — Progress Notes (Signed)
[facility]  Chief Complaint  Patient presents with  . Follow-up    Wearing CPAP 6-7hrs. Fitting and pr. good.     Vital signs: BP (!) 144/84 (BP Location: Left Arm, Cuff Size: Normal)   Pulse 70   Ht 6' (1.829 m)   Wt 204 lb 6.4 oz (92.7 kg)   SpO2 96%   BMI 27.72 kg/m   History of Present Illness: James [name] is a 62 y.o. male obstructive sleep apnea.  He was previously followed by Dr. Corrie Dandy.  He uses CPAP nightly.  Using nasal pillows.  No issues with mask fit.  Feels rested.  He has trouble with sinus congestion and allergies.  Has been on afrin for years.  Was asking about whether he could have surgery.  He has tried flonase intermittently w/o improvement.  He was seen by an allergist who told him he needed to stop smoking first.  He didn't have allergy testing done.   Physical Exam:  General - pleasant Eyes - pupils reactive ENT - no sinus tenderness, no oral exudate, no LAN Cardiac - regular, no murmur Chest - no wheeze, rales Abd - soft, non tender Ext - no edema Skin - no rashes Neuro - normal strength Psych - normal mood  Assessment/Plan:  Obstructive sleep apnea. - he is compliant with CPAP and reports benefit from therapy - continue auto CPAP  Allergic rhinitis. Rhinitis medicamentosa. - he is reluctant to stop afrin - advised that he would likely need further allergy assessment prior to ENT evaluation - he will think about his options, and call if he wants a referral to an allergist   Patient Instructions  Follow up in 1 year    James Mires, MD [facility] 01/31/2017, 12:04 PM Pager:  [phone]  Flow Sheet  Sleep tests: HST 10/07/15 >> AHI 48 Auto CPAP 12/31/16 to 01/29/17 >> used on 29 of 30 nights with average 5 hrs 44 min.  Average AHI 0.8 with median CPAP 7 and 95 th percentile CPAP 9 cm H2O  Past Medical History: He  has a past medical history of Anemia, Colitis,  Degenerative disc disease, Diverticulosis (2013), GERD (gastroesophageal reflux disease), Hiatal hernia, colonic polyps (2013), Hyperlipidemia, Internal hemorrhoids (2013), Lower back pain, and Tubular adenoma.  Past Surgical History: He  has a past surgical history that includes Hernia repair (1980); Colonoscopy; Total hip arthroplasty (Right, 03/14/2014); and Wisdom tooth extraction.  Family History: His family history includes Colon cancer in his mother; Colon cancer (age of onset: 11) in his father; Esophageal cancer in his father; Heart disease in his father.  Social History: He  reports that he quit smoking about 2 years ago. His smoking use included cigarettes. He has a 7.50 pack-year smoking history. he has never used smokeless tobacco. He reports that he drinks about 4.2 oz of alcohol per week. He reports that he does not use drugs.  Medications: Allergies as of 01/31/2017   No Known Allergies     Medication List        Accurate as of 01/31/17 12:04 PM. Always use your most recent med list.          AFRIN 12 HOUR 0.05 % nasal spray Generic drug:  oxymetazoline Place 2 sprays into both nostrils at bedtime.   mesalamine 1.2 g EC tablet Commonly known as:  LIALDA Take 2 tab twice daily   mesalamine 1000 MG suppository Commonly known as:  Bridgeton  1 suppository (1,000 mg total) rectally at bedtime.   multivitamin tablet Take 1 tablet by mouth daily.   pantoprazole 40 MG tablet Commonly known as:  PROTONIX Take 1 tablet (40 mg total) by mouth daily.

## 2017-01-31 NOTE — Progress Notes (Signed)
R

## 2017-01-31 NOTE — Patient Instructions (Signed)
Follow up in 1 year.

## 2017-02-02 DIAGNOSIS — H35051 Retinal neovascularization, unspecified, right eye: Secondary | ICD-10-CM | POA: Diagnosis not present

## 2017-02-09 ENCOUNTER — Ambulatory Visit (INDEPENDENT_AMBULATORY_CARE_PROVIDER_SITE_OTHER): Payer: 59 | Admitting: Orthopaedic Surgery

## 2017-02-09 ENCOUNTER — Ambulatory Visit (INDEPENDENT_AMBULATORY_CARE_PROVIDER_SITE_OTHER): Payer: 59

## 2017-02-09 ENCOUNTER — Encounter (INDEPENDENT_AMBULATORY_CARE_PROVIDER_SITE_OTHER): Payer: Self-pay | Admitting: Orthopaedic Surgery

## 2017-02-09 DIAGNOSIS — M25562 Pain in left knee: Secondary | ICD-10-CM | POA: Diagnosis not present

## 2017-02-09 MED ORDER — HYDROCODONE-ACETAMINOPHEN 5-325 MG PO TABS: 1.0000 | ORAL_TABLET | Freq: Three times a day (TID) | ORAL | 0 refills | Status: DC | PRN

## 2017-02-09 NOTE — Progress Notes (Signed)
Office Visit Note   Patient: James [name]           Date of Birth: 1955-02-08           MRN: [id number] Visit Date: 02/09/2017              Requested by: Elby Showers, MD [address] [place], [place] [address] PCP: Elby Showers, MD   Assessment & Plan: Visit Diagnoses:  1. Acute pain of left knee     Plan: Given his clinical exam of the left knee of an effusion with a positive Murray sign and the fact that we cannot place a steroid safely in his knee at this point do to his retina issues an MRI is warranted to rule out a meniscal tear.  I do feel that this is medically necessary now given the clinical exam of a positive McMurray sign as well as locking catching as in his knee.  We will order MRI of his knee to rule out a meniscal tear.  Of note when he comes back for his next visit I would like an AP and scapular Y view of his right shoulder.  He did let me know that he has been having some snapping of the scapula and that a mass is been noted in this area which when he showed me I can see it when he bends over and moves his shoulder certain way there is a hypermobile mass just off the posterior medial border of the scapula.  We will need to evaluate this at his next visit as well.  Follow-Up Instructions: Return in about 2 weeks (around 02/23/2017).   Orders:  Orders Placed This Encounter  Procedures  . XR Knee 1-2 Views Left   Meds ordered this encounter  Medications  . HYDROcodone-acetaminophen (NORCO/VICODIN) 5-325 MG tablet    Sig: Take 1-2 tablets by mouth 3 (three) times daily as needed for moderate pain.    Dispense:  30 tablet    Refill:  0      Procedures: No procedures performed   Clinical Data: No additional findings.   Subjective: Chief Complaint  Patient presents with  . Left Knee - Pain  The patient comes in with acute knee issue.  I seen him for his left knee before but sometime last week he had a pivoting activity because of severe pain  in the knee and now is not been able to ambulate well in the knee is been locking catching on him.  Is also been swelling as well.  Is waking up quite a bit at night.  He cannot have any steroids now due to severe eye problem and his retina specialist says no more steroids.  He is using a knee sleeve and walking with a cane.  At this point is not getting any good rest at night due to the pain in his left knee and pivoting activities are hurting him quite a bit.  He currently denies any headache, chest pain, shortness of breath, fever, chills, nausea, vomiting.  HPI  Review of Systems He currently denies any headache, chest pain, shortness of breath, fever, chills, nausea, vomiting.   Objective: Vital Signs: There were no vitals taken for this visit.  Physical Exam Ortho Exam He is alert and oriented x3 in no acute distress.  Examination of his left knee shows a mild joint effusion.  He has lateral and medial joint line tenderness but is significantly positive McMurray sign to the medial side  of the knee.  His Lockman test that was negative. Specialty Comments:  No specialty comments available.  Imaging: Xr Knee 1-2 Views Left  Result Date: 02/09/2017 2 views of the left knee show well-maintained joint space with no competent features other than a mild joint effusion.  There are no acute findings in the joint space appears well-maintained.  There is slight patellofemoral arthritic changes.    PMFS History: Patient Active Problem List   Diagnosis Date Noted  . Barrett's esophagus 05/30/2016  . OSA (obstructive sleep apnea) 12/23/2015  . Exertional dyspnea 12/23/2015  . 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 Status post total replacement of right hip 03/14/2014  . Osteoarthritis of right hip 03/04/2014  . Allergic rhinitis 03/04/2014  . History of adenomatous polyp of colon 08/19/2011  . Left sided colitis ([place]) 08/19/2011  . GE reflux 11/14/2010  . Low back pain 11/14/2010  .  History of smoking 11/14/2010  . Hyperlipidemia 11/14/2010   Past Medical History:  Diagnosis Date  . Anemia   . Colitis   . Degenerative disc disease   . Diverticulosis 2013   Colonoscopy  . GERD (gastroesophageal reflux disease)   . Hiatal hernia   . Hx of colonic polyps 2013   Colonoscopy- tubular adenoma, and hyperplastic   . Hyperlipidemia   . Internal hemorrhoids 2013   Colonoscopy  . Lower back pain   . Tubular adenoma     Family History  Problem Relation Age of Onset  . Heart disease Father   . Colon cancer Father 64  . Esophageal cancer Father        late 57's  . Colon cancer Mother        late 9s  . Stomach cancer Neg Hx   . AAA (abdominal aortic aneurysm) Neg Hx     Past Surgical History:  Procedure Laterality Date  . COLONOSCOPY    . HERNIA REPAIR  1980   left inguinal  . TOTAL HIP ARTHROPLASTY Right 03/14/2014   Procedure: RIGHT TOTAL HIP ARTHROPLASTY ANTERIOR APPROACH ;  Surgeon: Mcarthur Rossetti, MD;  Location: [facility];  Service: Orthopedics;  Laterality: Right;  . WISDOM TOOTH EXTRACTION     Social History   Occupational History  . Occupation: Academic librarian  Tobacco Use  . Smoking status: Former Smoker    Packs/day: 0.25    Years: 30.00    Pack years: 7.50    Types: Cigarettes    Last attempt to quit: 03/09/2014    Years since quitting: 2.9  . Smokeless tobacco: Never Used  . Tobacco comment: Counseling sheet given 08-2011  Substance and Sexual Activity  . Alcohol use: Yes    Alcohol/week: 4.2 oz    Types: 7 Standard drinks or equivalent per week    Comment: week  . Drug use: No  . Sexual activity: Not on file

## 2017-02-10 ENCOUNTER — Other Ambulatory Visit (INDEPENDENT_AMBULATORY_CARE_PROVIDER_SITE_OTHER): Payer: Self-pay | Admitting: Radiology

## 2017-02-10 DIAGNOSIS — M25562 Pain in left knee: Secondary | ICD-10-CM

## 2017-02-14 ENCOUNTER — Ambulatory Visit (INDEPENDENT_AMBULATORY_CARE_PROVIDER_SITE_OTHER): Payer: 59 | Admitting: Internal Medicine

## 2017-02-14 ENCOUNTER — Encounter: Payer: Self-pay | Admitting: Internal Medicine

## 2017-02-14 VITALS — BP 130/88 | HR 64 | Ht 70.5 in | Wt 206.8 lb

## 2017-02-14 DIAGNOSIS — K5150 Left sided colitis without complications: Secondary | ICD-10-CM

## 2017-02-14 DIAGNOSIS — K219 Gastro-esophageal reflux disease without esophagitis: Secondary | ICD-10-CM | POA: Diagnosis not present

## 2017-02-14 DIAGNOSIS — K2270 Barrett's esophagus without dysplasia: Secondary | ICD-10-CM | POA: Diagnosis not present

## 2017-02-14 NOTE — Patient Instructions (Signed)
Please continue Lialda 4.8 grams daily Continue canasa every night. Continue Protonix. Follow up with Dr Hilarie Fredrickson in 6 months. If you are age 63 or older, your body mass index should be between 23-30. Your Body mass index is 29.25 kg/m. If this is out of the aforementioned range listed, please consider follow up with your Primary Care Provider.  If you are age 20 or younger, your body mass index should be between 19-25. Your Body mass index is 29.25 kg/m. If this is out of the aformentioned range listed, please consider follow up with your Primary Care Provider.

## 2017-02-14 NOTE — Progress Notes (Signed)
Subjective:    Patient ID: James [name], male    DOB: May 08, 1955, 62 y.o.   MRN: [id number]  HPI James [name] is a 62 year old male with a history of left-sided colitis (diagnosed in 2018 by colonoscopy), history of Barrett's esophagus, history of iron deficiency anemia secondary to colitis, history of chorioretinopathy who is here for follow-up.  He was last seen at the time of his surveillance flexible sigmoidoscopy on 11/09/2016.  He has been maintained on Lialda 4.8 g daily and since flex sig Canasa 1 g nightly.  He reports that he has been doing well with current therapy.  He has been able to liberalize his diet and he has had no further blood in his stool.  No mucus in his stool.  No abdominal pain.  He has liberalized his diet and that he is eating more fruits and vegetables and tolerating this well.  He has gained about 3 pounds.  He states he was overeating to maintain his weight but now he is starting to gain weight so he is paying attention to his diet to avoid weight gain.  Stools have become on average once per day occasionally 2 times per day.  Stools have more formed.  No fevers, chills or night sweats.  He is continue pantoprazole 40 mg daily without reflux.  No dysphagia or odynophagia.  He is having some knee trouble specifically with his left knee and this is being evaluated by Dr. Ninfa Linden. He has a pending MRI.  He is avoiding steroid injections given his severe issues with his chorioretinopathy  At flexible sigmoidoscopy in November 2018 he had a short segment, 3-4 cm in the distal rectum of mild inflammation.  There was also mild localized inflammation in a very focal segment of the sigmoid about 25 cm from the dentate line.  A 6 mm polyp was removed from the sigmoid.  Biopsies from the rectum showed moderate chronic active colitis without dysplasia.  The left colon biopsy showed chronic mildly active colitis without dysplasia.  The sigmoid polyp was hyperplastic.  Review  of Systems As per HPI, otherwise negative  Current Medications, Allergies, Past Medical History, Past Surgical History, Family History and Social History were reviewed in [employer] record.     Objective:   Physical Exam BP 130/88   Pulse 64   Ht 5' 10.5" (1.791 m)   Wt 206 lb 12.8 oz (93.8 kg)   BMI 29.25 kg/m  Constitutional: Well-developed and well-nourished. No distress. HEENT: Normocephalic and atraumatic.  Conjunctivae are normal.  No scleral icterus. Neck: Neck supple. Trachea midline. Cardiovascular: Normal rate, regular rhythm and intact distal pulses. No M/R/G Pulmonary/chest: Effort normal and breath sounds normal. No wheezing, rales or rhonchi. Abdominal: Soft, nontender, nondistended. Bowel sounds active throughout. There are no masses palpable. No hepatosplenomegaly. Extremities: no clubbing, cyanosis, or edema Neurological: Alert and oriented to person place and time. Skin: Skin is warm and dry. Psychiatric: Normal mood and affect. Behavior is normal.  CBC    Component Value Date/Time   WBC 7.0 09/28/2016 1600   RBC 4.38 09/28/2016 1600   HGB 13.8 09/28/2016 1600   HCT 41.4 09/28/2016 1600   PLT 277.0 09/28/2016 1600   MCV 94.4 09/28/2016 1600   MCH 29.8 06/16/2016 1242   MCHC 33.4 09/28/2016 1600   RDW 13.4 09/28/2016 1600   LYMPHSABS 2.1 09/28/2016 1600   MONOABS 0.6 09/28/2016 1600   EOSABS 0.1 09/28/2016 1600   BASOSABS 0.1 09/28/2016 1600  CMP     Component Value Date/Time   NA 136 09/28/2016 1600   K 3.9 09/28/2016 1600   CL 100 09/28/2016 1600   CO2 26 09/28/2016 1600   GLUCOSE 91 09/28/2016 1600   BUN 12 09/28/2016 1600   CREATININE 0.83 09/28/2016 1600   CREATININE 0.87 08/04/2016 1049   CALCIUM 9.7 09/28/2016 1600   PROT 7.3 09/28/2016 1600   ALBUMIN 4.3 09/28/2016 1600   AST 21 09/28/2016 1600   ALT 13 09/28/2016 1600   ALKPHOS 53 09/28/2016 1600   BILITOT 0.7 09/28/2016 1600   GFRNONAA >89 08/04/2016 1049     GFRAA >89 08/04/2016 1049       Assessment & Plan:   62 year old male with a history of left-sided colitis (diagnosed in 2018 by colonoscopy), history of Barrett's esophagus, history of iron deficiency anemia secondary to colitis, history of chorioretinopathy who is here for follow-up.   1.  Left-sided colitis --doing much better clinically with improving endoscopic disease as of November 2018.  We will continue Lialda 4.8 g daily and Canasa 1 g nightly.  At this time given improvement I do not think we need to escalate therapy to Biologics or immunomodulators.  However we need to strictly avoid steroids given his eye  disease.  If his symptoms worsen in the future we will need to discuss escalation of therapy at that time.  Is up-to-date with vaccination.  2.  Iron deficiency anemia --resolved as of last check.  Improved with improving #1  3.  History of Barrett's with GERD --stable without alarm symptoms.  Continue pantoprazole 40 mg daily  15-monthfollow-up, sooner if needed 25 minutes spent with the patient today. Greater than 50% was spent in counseling and coordination of care with the patient

## 2017-02-15 ENCOUNTER — Ambulatory Visit (INDEPENDENT_AMBULATORY_CARE_PROVIDER_SITE_OTHER): Payer: 59 | Admitting: Orthopaedic Surgery

## 2017-02-15 ENCOUNTER — Telehealth (INDEPENDENT_AMBULATORY_CARE_PROVIDER_SITE_OTHER): Payer: Self-pay | Admitting: Orthopaedic Surgery

## 2017-02-15 NOTE — Telephone Encounter (Signed)
Patient called wanting to have MRI of both knees instead of just the left that Dr. Ninfa Linden ordered. He was wondering if you could change that for him and give him a call once done. CB # [phone]

## 2017-02-15 NOTE — Telephone Encounter (Signed)
Can we add the other knee to his MRI?

## 2017-02-15 NOTE — Telephone Encounter (Signed)
Please advise 

## 2017-02-15 NOTE — Telephone Encounter (Signed)
Certainly given the pain and problems he has had both knees we can MRI both of them if his insurance will allow for this.

## 2017-02-16 NOTE — Telephone Encounter (Signed)
Yes I can add to his insurance, what is the dx and I can put it in if you dont have time to do it.

## 2017-02-16 NOTE — Telephone Encounter (Signed)
M25.561 Thank you

## 2017-02-17 ENCOUNTER — Other Ambulatory Visit (INDEPENDENT_AMBULATORY_CARE_PROVIDER_SITE_OTHER): Payer: Self-pay | Admitting: Orthopaedic Surgery

## 2017-02-17 DIAGNOSIS — M25561 Pain in right knee: Principal | ICD-10-CM

## 2017-02-17 DIAGNOSIS — G8929 Other chronic pain: Secondary | ICD-10-CM

## 2017-02-17 NOTE — Telephone Encounter (Signed)
Patient would like a call once insurance approves the MRI instead of him checking up every day. I advised him of what you told me: going through the insurance and could possibly be under review due to it being a duplicate. He understands. # [phone]

## 2017-02-17 NOTE — Telephone Encounter (Signed)
Order placed, will go thru insurance for approval.

## 2017-02-21 NOTE — Telephone Encounter (Signed)
Pt aware insurance has approved for MIR

## 2017-02-23 ENCOUNTER — Ambulatory Visit (INDEPENDENT_AMBULATORY_CARE_PROVIDER_SITE_OTHER): Payer: 59 | Admitting: Orthopaedic Surgery

## 2017-02-24 ENCOUNTER — Telehealth (INDEPENDENT_AMBULATORY_CARE_PROVIDER_SITE_OTHER): Payer: Self-pay | Admitting: Orthopaedic Surgery

## 2017-02-24 ENCOUNTER — Other Ambulatory Visit (INDEPENDENT_AMBULATORY_CARE_PROVIDER_SITE_OTHER): Payer: Self-pay | Admitting: Family

## 2017-02-24 ENCOUNTER — Telehealth (INDEPENDENT_AMBULATORY_CARE_PROVIDER_SITE_OTHER): Payer: Self-pay | Admitting: Radiology

## 2017-02-24 MED ORDER — HYDROCODONE-ACETAMINOPHEN 5-325 MG PO TABS: 1.0000 | ORAL_TABLET | Freq: Three times a day (TID) | ORAL | 0 refills | Status: DC | PRN

## 2017-02-24 NOTE — Telephone Encounter (Signed)
Patient is aware that his rx is ready for pick up at the front desk

## 2017-02-24 NOTE — Telephone Encounter (Signed)
Patient is calling states that he is almost out of his pain meds. He called earlier and was told that Dr. Adela Ports assistant would call him back about his pain meds and hasn't heard anything back.  Can you please look and see if you are able rx him anything for his pain.

## 2017-02-24 NOTE — Telephone Encounter (Signed)
Patient called asking for a refill on his hydrocodone. CB # [phone]

## 2017-02-26 ENCOUNTER — Ambulatory Visit
Admission: RE | Admit: 2017-02-26 | Discharge: 2017-02-26 | Disposition: A | Discharge status: Home / Self Care | Payer: 59 | Source: Ambulatory Visit | Attending: Orthopaedic Surgery | Admitting: Orthopaedic Surgery

## 2017-02-26 DIAGNOSIS — M25561 Pain in right knee: Secondary | ICD-10-CM | POA: Diagnosis not present

## 2017-02-26 DIAGNOSIS — G8929 Other chronic pain: Secondary | ICD-10-CM

## 2017-02-26 DIAGNOSIS — M25562 Pain in left knee: Secondary | ICD-10-CM

## 2017-02-27 ENCOUNTER — Other Ambulatory Visit (INDEPENDENT_AMBULATORY_CARE_PROVIDER_SITE_OTHER): Payer: Self-pay | Admitting: Orthopaedic Surgery

## 2017-02-27 MED ORDER — HYDROCODONE-ACETAMINOPHEN 5-325 MG PO TABS: 1.0000 | ORAL_TABLET | Freq: Three times a day (TID) | ORAL | 0 refills | Status: DC | PRN

## 2017-02-27 NOTE — Telephone Encounter (Signed)
Can come pick up script, but needs to try to use sparingly.

## 2017-02-27 NOTE — Telephone Encounter (Signed)
Please advise 

## 2017-02-27 NOTE — Telephone Encounter (Signed)
LMOM for patient Rx ready

## 2017-03-01 ENCOUNTER — Ambulatory Visit (INDEPENDENT_AMBULATORY_CARE_PROVIDER_SITE_OTHER): Payer: 59 | Admitting: Orthopaedic Surgery

## 2017-03-01 ENCOUNTER — Other Ambulatory Visit: Payer: 59

## 2017-03-01 ENCOUNTER — Ambulatory Visit (INDEPENDENT_AMBULATORY_CARE_PROVIDER_SITE_OTHER): Payer: 59

## 2017-03-01 ENCOUNTER — Encounter (INDEPENDENT_AMBULATORY_CARE_PROVIDER_SITE_OTHER): Payer: Self-pay | Admitting: Orthopaedic Surgery

## 2017-03-01 DIAGNOSIS — M898X1 Other specified disorders of bone, shoulder: Secondary | ICD-10-CM

## 2017-03-01 DIAGNOSIS — M23204 Derangement of unspecified medial meniscus due to old tear or injury, left knee: Secondary | ICD-10-CM | POA: Diagnosis not present

## 2017-03-01 NOTE — Progress Notes (Signed)
Office Visit Note   Patient: James [name]           Date of Birth: 23-Apr-1955           MRN: [id number] Visit Date: 03/01/2017              Requested by: Elby Showers, MD [address] [place], [place] [address] PCP: Elby Showers, MD   Assessment & Plan: Visit Diagnoses:  1. Pain of right clavicle   2. Degenerative tear of left medial meniscus     Plan: I do feel that the parascapular mass on his right side needs to be evaluated with an MRI to assess the soft tissues determine whether or not this is a cyst versus lipoma versus something more worrisome since is been getting more painful with time and is going over a year.  As far as his left knee goes we are recommending arthroscopic intervention with the idea that we can take care of the meniscal tear and assess his cartilage better.  Hopefully after his arthroscopy we can consider hyaluronic acid for both knees which I think will be helpful for him.  We had a long and thorough discussion about surgery as well as the risk and benefits of this showing in the knee model explaining this in detail.  We will see him back in 1 week after the surgery.  We will see him back in some amount of time as we assess the soft tissue mass of his parascapular area on the right side.  Follow-Up Instructions: Return for 1 week post-op.   Orders:  Orders Placed This Encounter  Procedures  . XR Clavicle Right   No orders of the defined types were placed in this encounter.     Procedures: No procedures performed   Clinical Data: No additional findings.   Subjective: Chief Complaint  Patient presents with  . Right Shoulder - Pain  Patient comes in today actually for several different things.  He is following up for MRIs of his left and right knees.  He also has a mass in his parascapular area on the right side that is slowly grown over a year and is painful to him with certain activities.  His left knee hurts worse than his right.   Is mainly with weightbearing and pivoting activities.  Is been going on for a long period of time and he failed all conservative treatment measures so was sent in for the MRI of his knees.  HPI  Review of Systems He currently denies any headache, chest pain, shortness of breath, fever, chills, nausea, vomiting.  Objective: Vital Signs: There were no vitals taken for this visit.  Physical Exam He is alert and oriented x3 and in no acute distress Ortho Exam Examination of his right shoulder shows a large hypermobile mass in the soft tissue at the medial border of the scapula near the scapular wing area.  There is no redness to this area but is painful and it is at least 4-5 cm in diameter.  Examination of both knees show medial joint line tenderness with a positive Murray sign of the left knee but not the right knee.  Both knees have full range of motion and feel ligamentously stable.  Both knees have just a mild effusion. Specialty Comments:  No specialty comments available.  Imaging: Xr Clavicle Right  Result Date: 03/01/2017 2 views of the right clavicle appear normal. The MRI of the left and right knees are reviewed  independently.  He does have a large complex medial meniscal tear of the left knee.  There is moderate arthritis in the medial compartment of the knee but no full-thickness cartilage deficit.  He does have significant arthritis of the patellofemoral joint bilaterally apex of the patella.  His right knee shows moderate degenerative changes but no full-thickness cartilage loss and no meniscal tearing.   PMFS History: Patient Active Problem List   Diagnosis Date Noted  . Barrett's esophagus 05/30/2016  . OSA (obstructive sleep apnea) 12/23/2015  . Exertional dyspnea 12/23/2015  . 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 Status post total replacement of right hip 03/14/2014  . Osteoarthritis of right hip 03/04/2014  . Allergic rhinitis 03/04/2014  . History of  adenomatous polyp of colon 08/19/2011  . Left sided colitis ([place]) 08/19/2011  . GE reflux 11/14/2010  . Low back pain 11/14/2010  . History of smoking 11/14/2010  . Hyperlipidemia 11/14/2010   Past Medical History:  Diagnosis Date  . Anemia   . Colitis   . Degenerative disc disease   . Diverticulosis 2013   Colonoscopy  . GERD (gastroesophageal reflux disease)   . Hiatal hernia   . Hx of colonic polyps 2013   Colonoscopy- tubular adenoma, and hyperplastic   . Hyperlipidemia   . Internal hemorrhoids 2013   Colonoscopy  . Lower back pain   . Tubular adenoma     Family History  Problem Relation Age of Onset  . Heart disease Father   . Colon cancer Father 54  . Esophageal cancer Father        late 79's  . Colon cancer Mother        late 59s  . Stomach cancer Neg Hx   . AAA (abdominal aortic aneurysm) Neg Hx     Past Surgical History:  Procedure Laterality Date  . COLONOSCOPY    . HERNIA REPAIR  1980   left inguinal  . TOTAL HIP ARTHROPLASTY Right 03/14/2014   Procedure: RIGHT TOTAL HIP ARTHROPLASTY ANTERIOR APPROACH ;  Surgeon: Mcarthur Rossetti, MD;  Location: [facility];  Service: Orthopedics;  Laterality: Right;  . WISDOM TOOTH EXTRACTION     Social History   Occupational History  . Occupation: Academic librarian  Tobacco Use  . Smoking status: Former Smoker    Packs/day: 0.25    Years: 30.00    Pack years: 7.50    Types: Cigarettes    Last attempt to quit: 03/09/2014    Years since quitting: 2.9  . Smokeless tobacco: Never Used  . Tobacco comment: Counseling sheet given 08-2011  Substance and Sexual Activity  . Alcohol use: Yes    Alcohol/week: 4.2 oz    Types: 7 Standard drinks or equivalent per week    Comment: week  . Drug use: No  . Sexual activity: Not on file

## 2017-03-02 ENCOUNTER — Other Ambulatory Visit (INDEPENDENT_AMBULATORY_CARE_PROVIDER_SITE_OTHER): Payer: Self-pay

## 2017-03-02 DIAGNOSIS — M898X1 Other specified disorders of bone, shoulder: Secondary | ICD-10-CM

## 2017-03-06 ENCOUNTER — Telehealth (INDEPENDENT_AMBULATORY_CARE_PROVIDER_SITE_OTHER): Payer: Self-pay | Admitting: *Deleted

## 2017-03-06 NOTE — Telephone Encounter (Signed)
Pt is scheduled for MRI at [facility] on Wednesday Feb 27th at 2pm for Blood work and then 3pm for exam. Pt is to arrive 15 mins early for registration. Pt is to go to 1st floor radiology. LMTRC to pt for appt information.

## 2017-03-09 DIAGNOSIS — H35051 Retinal neovascularization, unspecified, right eye: Secondary | ICD-10-CM | POA: Diagnosis not present

## 2017-03-09 DIAGNOSIS — H35713 Central serous chorioretinopathy, bilateral: Secondary | ICD-10-CM | POA: Diagnosis not present

## 2017-03-10 ENCOUNTER — Ambulatory Visit: Payer: 59 | Admitting: Internal Medicine

## 2017-03-10 ENCOUNTER — Encounter: Payer: Self-pay | Admitting: Internal Medicine

## 2017-03-10 VITALS — BP 140/90 | HR 78 | Ht 70.5 in | Wt 206.0 lb

## 2017-03-10 DIAGNOSIS — M25562 Pain in left knee: Secondary | ICD-10-CM

## 2017-03-10 DIAGNOSIS — G8929 Other chronic pain: Secondary | ICD-10-CM | POA: Diagnosis not present

## 2017-03-11 NOTE — Progress Notes (Signed)
   Subjective:    Patient ID: James [name], male    DOB: May 02, 1955, 62 y.o.   MRN: [id number]  HPI 62 year old Male in today for discussion regarding parascapular mass on the right side.  He is going to have an MRI in the near future to see if this is a cyst or lipoma since it has been painful recently.  Also has been diagnosed by Dr. Ninfa Linden with a degenerative tear of medial meniscus left knee.  Arthroscopic surgery has been recommended.  After that he will give consideration to hyaluronic acid for both knees.  Knee surgery is scheduled for next week as he is MRI of the shoulder    Review of Systems see above    Objective:   Physical Exam    Not examined today but spent 20 minutes speaking with him about these issues.  His main concern is that if he does arthroscopic surgery and things do not go well possibly facing total knee replacement.  He is wondering if he is better off getting total knee replacement.  I think Dr. Trevor Mace approach is very reasonable.    Assessment & Plan:  Patient says he feels better after speaking with me and that he will most likely proceed with arthroscopic surgery.  He does plan to have MRI of the shoulder.

## 2017-03-11 NOTE — Patient Instructions (Signed)
Patient most likely will proceed with arthroscopic surgery if needed.  MRI of shoulder pending.

## 2017-03-15 ENCOUNTER — Telehealth (INDEPENDENT_AMBULATORY_CARE_PROVIDER_SITE_OTHER): Payer: Self-pay | Admitting: Orthopaedic Surgery

## 2017-03-15 ENCOUNTER — Ambulatory Visit (HOSPITAL_COMMUNITY)
Admission: RE | Admit: 2017-03-15 | Discharge: 2017-03-15 | Disposition: A | Discharge status: Home / Self Care | Payer: 59 | Source: Ambulatory Visit | Attending: Orthopaedic Surgery | Admitting: Orthopaedic Surgery

## 2017-03-15 ENCOUNTER — Other Ambulatory Visit (HOSPITAL_COMMUNITY)
Admission: RE | Admit: 2017-03-15 | Discharge: 2017-03-15 | Disposition: A | Discharge status: Home / Self Care | Payer: 59 | Source: Ambulatory Visit | Attending: Orthopaedic Surgery | Admitting: Orthopaedic Surgery

## 2017-03-15 DIAGNOSIS — M898X1 Other specified disorders of bone, shoulder: Secondary | ICD-10-CM

## 2017-03-15 DIAGNOSIS — M799 Soft tissue disorder, unspecified: Secondary | ICD-10-CM | POA: Diagnosis not present

## 2017-03-15 DIAGNOSIS — M25562 Pain in left knee: Secondary | ICD-10-CM

## 2017-03-15 LAB — POCT I-STAT CREATININE: Creatinine, Ser: 0.8 mg/dL (ref 0.61–1.24)

## 2017-03-15 MED ORDER — GADOBENATE DIMEGLUMINE 529 MG/ML IV SOLN
20.0000 mL | Freq: Once | INTRAVENOUS | Status: AC | PRN
  Administered 2017-03-15: 20 mL via INTRAVENOUS

## 2017-03-15 NOTE — Telephone Encounter (Signed)
See below

## 2017-03-15 NOTE — Telephone Encounter (Signed)
Kim with [facility] needs orders for this patient faxed over [phone]. She couldn't tell me what for since she doesn't have the order? Please advise, patient is there now.

## 2017-03-15 NOTE — Telephone Encounter (Signed)
I placed order for CBC as requested by [facility] scheduler before can get MRI. I called number listed and shows busy.

## 2017-03-16 DIAGNOSIS — M23222 Derangement of posterior horn of medial meniscus due to old tear or injury, left knee: Secondary | ICD-10-CM | POA: Diagnosis not present

## 2017-03-16 DIAGNOSIS — G8918 Other acute postprocedural pain: Secondary | ICD-10-CM | POA: Diagnosis not present

## 2017-03-16 DIAGNOSIS — M1712 Unilateral primary osteoarthritis, left knee: Secondary | ICD-10-CM | POA: Diagnosis not present

## 2017-03-16 DIAGNOSIS — M948X6 Other specified disorders of cartilage, lower leg: Secondary | ICD-10-CM | POA: Diagnosis not present

## 2017-03-23 ENCOUNTER — Encounter (INDEPENDENT_AMBULATORY_CARE_PROVIDER_SITE_OTHER): Payer: Self-pay | Admitting: Orthopaedic Surgery

## 2017-03-23 ENCOUNTER — Ambulatory Visit (INDEPENDENT_AMBULATORY_CARE_PROVIDER_SITE_OTHER): Payer: 59 | Admitting: Orthopaedic Surgery

## 2017-03-23 DIAGNOSIS — Z9889 Other specified postprocedural states: Secondary | ICD-10-CM

## 2017-03-23 NOTE — Progress Notes (Signed)
Patient is now 1 week status post a left knee arthroscopy to treat a symptomatic medial meniscal tear.  We did find some grade 3 changes of the cartilage in the medial compartment of his knee I do feel that he would be a candidate for hyaluronic acid at some point.  He does have moderate effusion of his knee today but he said he is doing well.  Is been trying to limit his activities and let pain be his guide.  We also seeing him back to go over an MRI of his right parascapular area where he gets a mass that is formed.  On examination of the scapular area the mass is not easily palpable except when he moves his scapula in certain positions and it occurs which is bothersome to him.  The MRI is reviewed and does not show any mass in this area nor any tears of the structures around the parascapular area.  I do feel that this is more anatomic related on how he can make this occur and not occur.  I would not recommend any surgery in this area based off of that because it would likely not get to the source of what causes that to happen it would still probably occur.  As far as his left knee goes he does have a moderate effusion with good range of motion overall.  At this point he will continue to work on strengthening his left knee with avoiding high impact aerobic activities.  Like to see him back in a month for repeat evaluation of his left knee to determine whether or not he would benefit from hyaluronic acid injection.

## 2017-03-30 DIAGNOSIS — H35051 Retinal neovascularization, unspecified, right eye: Secondary | ICD-10-CM | POA: Diagnosis not present

## 2017-03-30 DIAGNOSIS — H35713 Central serous chorioretinopathy, bilateral: Secondary | ICD-10-CM | POA: Diagnosis not present

## 2017-04-04 ENCOUNTER — Ambulatory Visit (INDEPENDENT_AMBULATORY_CARE_PROVIDER_SITE_OTHER): Payer: 59 | Admitting: Internal Medicine

## 2017-04-04 DIAGNOSIS — Z23 Encounter for immunization: Secondary | ICD-10-CM | POA: Diagnosis not present

## 2017-04-07 ENCOUNTER — Ambulatory Visit: Payer: 59 | Admitting: Internal Medicine

## 2017-04-07 ENCOUNTER — Encounter: Payer: Self-pay | Admitting: Internal Medicine

## 2017-04-07 VITALS — BP 120/80 | HR 72 | Temp 98.1°F | Ht 70.5 in | Wt 206.0 lb

## 2017-04-07 DIAGNOSIS — J0100 Acute maxillary sinusitis, unspecified: Secondary | ICD-10-CM

## 2017-04-07 DIAGNOSIS — J069 Acute upper respiratory infection, unspecified: Secondary | ICD-10-CM

## 2017-04-07 MED ORDER — CLARITHROMYCIN 500 MG PO TABS: 500.0000 mg | ORAL_TABLET | Freq: Two times a day (BID) | ORAL | 0 refills | Status: DC

## 2017-04-07 MED ORDER — HYDROCODONE-HOMATROPINE 5-1.5 MG/5ML PO SYRP: 5.0000 mL | ORAL_SOLUTION | Freq: Three times a day (TID) | ORAL | 0 refills | Status: DC | PRN

## 2017-04-08 NOTE — Patient Instructions (Signed)
Biaxin 500 mg twice daily for 10 days.  Hycodan 1 teaspoon p.o. every 8 hours as needed cough and sore throat pain.  Rest and drink plenty of fluids.

## 2017-04-08 NOTE — Progress Notes (Signed)
   Subjective:    Patient ID: James [name], male    DOB: 1955/12/21, 62 y.o.   MRN: [id number]  HPI He has come down with cough and congestion.  Slight sore throat.  No documented fever or shaking chills.  Wife is come down with similar illness just after he did.  He has been ill for couple of days.He  says that sputum and nasal discharge are looking a bit discolored.  Has been blowing her nose a lot.  Sounds nasally congested when he speaks.    Review of Systems see above-cough is worse at night.     Objective:   Physical Exam  Skin warm and dry.  Pharynx is injected without exudate.  TMs are clear.  Neck is supple without adenopathy.  Chest is clear to auscultation without rales or wheezing.      Assessment & Plan:  Acute URI  Acute maxillary sinusitis  Plan: Robaxin 500 mg twice daily for 10 days.  Hycodan 1 teaspoon p.o. every 8 hours as needed cough and sore throat pain.  Rest and drink plenty of fluids.

## 2017-04-10 ENCOUNTER — Encounter (INDEPENDENT_AMBULATORY_CARE_PROVIDER_SITE_OTHER): Payer: Self-pay | Admitting: Orthopaedic Surgery

## 2017-04-10 ENCOUNTER — Ambulatory Visit (INDEPENDENT_AMBULATORY_CARE_PROVIDER_SITE_OTHER): Payer: 59 | Admitting: Orthopaedic Surgery

## 2017-04-10 DIAGNOSIS — Z9889 Other specified postprocedural states: Secondary | ICD-10-CM

## 2017-04-10 DIAGNOSIS — M1712 Unilateral primary osteoarthritis, left knee: Secondary | ICD-10-CM | POA: Insufficient documentation

## 2017-04-10 NOTE — Progress Notes (Signed)
The patient is still in the postoperative period from a left knee arthroscopy with a partial medial meniscectomy.  He is been experiencing some swelling in his knee.  On examination today he does have a moderate effusion.  I was able to clean his knee with Betadine alcohol and withdrew about 40 cc of serous fluid from the knee that gave him some relief.  His knee is otherwise ligamentously stable in terms of exam of the left knee.  He is a perfect candidate for hyaluronic acid at this point to help with the arthritic changes and associated pain in his knee.  We will order this hyaluronic acid injection and see him back in 2 weeks to place in his left knee.

## 2017-04-12 ENCOUNTER — Telehealth (INDEPENDENT_AMBULATORY_CARE_PROVIDER_SITE_OTHER): Payer: Self-pay

## 2017-04-12 NOTE — Telephone Encounter (Signed)
Submitted application online for Monovisc, left knee.

## 2017-04-25 ENCOUNTER — Encounter (INDEPENDENT_AMBULATORY_CARE_PROVIDER_SITE_OTHER): Payer: Self-pay | Admitting: Orthopaedic Surgery

## 2017-04-25 ENCOUNTER — Ambulatory Visit (INDEPENDENT_AMBULATORY_CARE_PROVIDER_SITE_OTHER): Payer: 59 | Admitting: Orthopaedic Surgery

## 2017-04-25 DIAGNOSIS — Z9889 Other specified postprocedural states: Secondary | ICD-10-CM

## 2017-04-25 NOTE — Progress Notes (Signed)
The patient is now 6 weeks status post a left knee arthroscopy.  He has had a recurrent effusion on his left knee but is doing well otherwise.  He does have some arthritic changes in the medial compartment where we performed a partial medial meniscectomy.  He cannot have steroids.  His insurance will not cover hyaluronic acid.  On exam he does have a mild effusion with his left knee.  His incision sites have healed nicely has good range of motion.  I was able to aspirate 30 cc of serous fluid off of his knee without difficulty.  At this point I want him to work on quad strengthening exercises.  I showed him how to do these.  He will work on this on a daily basis.  At this point I do not need to see him back for 6 months unless he is having any issues.  At that visit I would like a standing AP and lateral of his left knee.  All questions concerns were answered and addressed.

## 2017-05-01 ENCOUNTER — Telehealth: Payer: Self-pay | Admitting: Internal Medicine

## 2017-05-01 NOTE — Telephone Encounter (Signed)
Patient states he needs medication canasa suppository refilled at [facility]. Pt last ov 1.29.19.

## 2017-05-01 NOTE — Telephone Encounter (Signed)
I contacted [facility] to ask about rx that we sent for canasa on 11/17/16 for #30 with 11 refills. Per pharmacy, rx is available but will have to be ordered and will not be able to be picked up by patient until 5 PM tomorrow. Patient has been advised of this.

## 2017-05-11 DIAGNOSIS — H35051 Retinal neovascularization, unspecified, right eye: Secondary | ICD-10-CM | POA: Diagnosis not present

## 2017-05-11 DIAGNOSIS — H43811 Vitreous degeneration, right eye: Secondary | ICD-10-CM | POA: Diagnosis not present

## 2017-05-11 DIAGNOSIS — H35713 Central serous chorioretinopathy, bilateral: Secondary | ICD-10-CM | POA: Diagnosis not present

## 2017-05-18 ENCOUNTER — Telehealth: Payer: Self-pay | Admitting: Internal Medicine

## 2017-05-18 NOTE — Telephone Encounter (Signed)
Pharmacy states that patient never picked up script when they got it ready for him back in April. They will get script ready for him now. Patient verbalizes understanding.

## 2017-05-18 NOTE — Telephone Encounter (Signed)
Pt needs to speak with you regarding refill for canasa, his pharmacy still does not have it.

## 2017-06-27 ENCOUNTER — Telehealth: Payer: Self-pay | Admitting: Internal Medicine

## 2017-06-27 MED ORDER — PANTOPRAZOLE SODIUM 40 MG PO TBEC: 40.0000 mg | DELAYED_RELEASE_TABLET | Freq: Every day | ORAL | 3 refills | Status: DC

## 2017-06-27 NOTE — Telephone Encounter (Signed)
Patient calling to request refill on his Protonix.  He has scheduled his CPE for 7/25.  Wants to know if we will refill his Protonix now.    Pharmacy:  [facility]  Phone:  [phone]  Thank you.

## 2017-06-27 NOTE — Telephone Encounter (Signed)
Refill Protonix x one year

## 2017-06-27 NOTE — Telephone Encounter (Signed)
Escribed

## 2017-07-21 ENCOUNTER — Other Ambulatory Visit: Payer: Self-pay | Admitting: Internal Medicine

## 2017-07-21 DIAGNOSIS — K51511 Left sided colitis with rectal bleeding: Secondary | ICD-10-CM

## 2017-07-26 ENCOUNTER — Telehealth: Payer: Self-pay | Admitting: Internal Medicine

## 2017-07-26 DIAGNOSIS — K51511 Left sided colitis with rectal bleeding: Secondary | ICD-10-CM

## 2017-07-26 MED ORDER — MESALAMINE 1.2 G PO TBEC: DELAYED_RELEASE_TABLET | ORAL | 2 refills | Status: DC

## 2017-07-26 NOTE — Telephone Encounter (Signed)
Patient scheduled needed appointment for follow up of his left sided colitis. I have sent refills of mesalamine to get him through until that time.

## 2017-08-07 ENCOUNTER — Other Ambulatory Visit: Payer: Self-pay | Admitting: Internal Medicine

## 2017-08-07 DIAGNOSIS — Z125 Encounter for screening for malignant neoplasm of prostate: Secondary | ICD-10-CM

## 2017-08-07 DIAGNOSIS — E7800 Pure hypercholesterolemia, unspecified: Secondary | ICD-10-CM

## 2017-08-07 DIAGNOSIS — K219 Gastro-esophageal reflux disease without esophagitis: Secondary | ICD-10-CM

## 2017-08-07 DIAGNOSIS — E785 Hyperlipidemia, unspecified: Secondary | ICD-10-CM

## 2017-08-07 DIAGNOSIS — K51318 Ulcerative (chronic) rectosigmoiditis with other complication: Secondary | ICD-10-CM

## 2017-08-07 DIAGNOSIS — Z0000 Encounter for general adult medical examination without abnormal findings: Secondary | ICD-10-CM

## 2017-08-07 DIAGNOSIS — G4730 Sleep apnea, unspecified: Secondary | ICD-10-CM

## 2017-08-08 ENCOUNTER — Other Ambulatory Visit: Payer: 59 | Admitting: Internal Medicine

## 2017-08-08 NOTE — Addendum Note (Signed)
Addended by: Mady Haagensen on: 08/08/2017 01:11 PM   Modules accepted: Orders

## 2017-08-09 ENCOUNTER — Other Ambulatory Visit (INDEPENDENT_AMBULATORY_CARE_PROVIDER_SITE_OTHER): Payer: 59 | Admitting: Internal Medicine

## 2017-08-09 ENCOUNTER — Ambulatory Visit (INDEPENDENT_AMBULATORY_CARE_PROVIDER_SITE_OTHER): Payer: 59 | Admitting: Orthopaedic Surgery

## 2017-08-09 ENCOUNTER — Encounter (INDEPENDENT_AMBULATORY_CARE_PROVIDER_SITE_OTHER): Payer: Self-pay | Admitting: Orthopaedic Surgery

## 2017-08-09 ENCOUNTER — Ambulatory Visit (INDEPENDENT_AMBULATORY_CARE_PROVIDER_SITE_OTHER): Payer: Self-pay

## 2017-08-09 DIAGNOSIS — K219 Gastro-esophageal reflux disease without esophagitis: Secondary | ICD-10-CM | POA: Diagnosis not present

## 2017-08-09 DIAGNOSIS — E785 Hyperlipidemia, unspecified: Secondary | ICD-10-CM

## 2017-08-09 DIAGNOSIS — Z125 Encounter for screening for malignant neoplasm of prostate: Secondary | ICD-10-CM | POA: Diagnosis not present

## 2017-08-09 DIAGNOSIS — Z0000 Encounter for general adult medical examination without abnormal findings: Secondary | ICD-10-CM | POA: Diagnosis not present

## 2017-08-09 DIAGNOSIS — M1712 Unilateral primary osteoarthritis, left knee: Secondary | ICD-10-CM | POA: Diagnosis not present

## 2017-08-09 DIAGNOSIS — Z9889 Other specified postprocedural states: Secondary | ICD-10-CM | POA: Diagnosis not present

## 2017-08-09 DIAGNOSIS — K51318 Ulcerative (chronic) rectosigmoiditis with other complication: Secondary | ICD-10-CM

## 2017-08-09 DIAGNOSIS — E7800 Pure hypercholesterolemia, unspecified: Secondary | ICD-10-CM | POA: Diagnosis not present

## 2017-08-09 DIAGNOSIS — G4730 Sleep apnea, unspecified: Secondary | ICD-10-CM

## 2017-08-09 MED ORDER — HYDROCODONE-ACETAMINOPHEN 5-325 MG PO TABS: 1.0000 | ORAL_TABLET | Freq: Four times a day (QID) | ORAL | 0 refills | Status: DC | PRN

## 2017-08-09 NOTE — Progress Notes (Signed)
The patient is well-known to me.  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  We performed arthroscopic intervention on that knee with a partial medial meniscectomy and did smooth cartilage on the medial aspect of his knee.  He cannot have steroid injections due to an optical issue with his eyes.  His insurance apparently will not cover hyaluronic acid which we think is the best thing for him and he agrees.  On exam he has a slight varus malalignment of the left knee that is easily correctable.  His range of motion is entirely full.  He has only medial joint line tenderness.  His x-rays also confirm moderate medial compartment arthritis of the  left knee.  And this correlates with his arthroscopy pictures.  At this point he is going to decide whether or not he considers pain for the hyaluronic acid out-of-pocket.  I will send in some hydrocodone for him.  I did show him partial knee replacement and explained what the surgery involves.  He is going to consider all these things in the future.

## 2017-08-10 ENCOUNTER — Encounter: Payer: Self-pay | Admitting: Internal Medicine

## 2017-08-10 ENCOUNTER — Ambulatory Visit (INDEPENDENT_AMBULATORY_CARE_PROVIDER_SITE_OTHER): Payer: 59 | Admitting: Internal Medicine

## 2017-08-10 VITALS — BP 120/80 | HR 70 | Ht 70.5 in | Wt 202.0 lb

## 2017-08-10 DIAGNOSIS — E7800 Pure hypercholesterolemia, unspecified: Secondary | ICD-10-CM | POA: Diagnosis not present

## 2017-08-10 DIAGNOSIS — K51318 Ulcerative (chronic) rectosigmoiditis with other complication: Secondary | ICD-10-CM | POA: Diagnosis not present

## 2017-08-10 DIAGNOSIS — K219 Gastro-esophageal reflux disease without esophagitis: Secondary | ICD-10-CM

## 2017-08-10 DIAGNOSIS — M24111 Other articular cartilage disorders, right shoulder: Secondary | ICD-10-CM | POA: Diagnosis not present

## 2017-08-10 DIAGNOSIS — Z0000 Encounter for general adult medical examination without abnormal findings: Secondary | ICD-10-CM

## 2017-08-10 DIAGNOSIS — M25562 Pain in left knee: Secondary | ICD-10-CM

## 2017-08-10 DIAGNOSIS — G4733 Obstructive sleep apnea (adult) (pediatric): Secondary | ICD-10-CM

## 2017-08-10 DIAGNOSIS — G8929 Other chronic pain: Secondary | ICD-10-CM

## 2017-08-10 LAB — POCT URINALYSIS DIPSTICK
Appearance: NORMAL
Bilirubin, UA: NEGATIVE
Glucose, UA: NEGATIVE
Ketones, UA: NEGATIVE
Leukocytes, UA: NEGATIVE
Nitrite, UA: NEGATIVE
Odor: NORMAL
Protein, UA: NEGATIVE
Spec Grav, UA: 1.015 (ref 1.010–1.025)
Urobilinogen, UA: 0.2 E.U./dL
pH, UA: 6.5 (ref 5.0–8.0)

## 2017-08-10 LAB — COMPLETE METABOLIC PANEL WITH GFR
AG Ratio: 1.7 (calc) (ref 1.0–2.5)
ALT: 11 U/L (ref 9–46)
AST: 20 U/L (ref 10–35)
Albumin: 4.6 g/dL (ref 3.6–5.1)
Alkaline phosphatase (APISO): 52 U/L (ref 40–115)
BUN: 12 mg/dL (ref 7–25)
CO2: 26 mmol/L (ref 20–32)
Calcium: 10.3 mg/dL (ref 8.6–10.3)
Chloride: 103 mmol/L (ref 98–110)
Creat: 0.87 mg/dL (ref 0.70–1.25)
GFR, Est African American: 106 mL/min/{1.73_m2} (ref >=60)
GFR, Est Non African American: 92 mL/min/{1.73_m2} (ref >=60)
Globulin: 2.7 g/dL (calc) (ref 1.9–3.7)
Glucose, Bld: 93 mg/dL (ref 65–99)
Potassium: 5.2 mmol/L (ref 3.5–5.3)
Sodium: 136 mmol/L (ref 135–146)
Total Bilirubin: 0.8 mg/dL (ref 0.2–1.2)
Total Protein: 7.3 g/dL (ref 6.1–8.1)

## 2017-08-10 LAB — CBC WITH DIFFERENTIAL/PLATELET
Basophils Absolute: 51 cells/uL (ref 0–200)
Basophils Relative: 1 %
Eosinophils Absolute: 102 cells/uL (ref 15–500)
Eosinophils Relative: 2 %
HCT: 42.5 % (ref 38.5–50.0)
Hemoglobin: 14.4 g/dL (ref 13.2–17.1)
Lymphs Abs: 1765 cells/uL (ref 850–3900)
MCH: 32.1 pg (ref 27.0–33.0)
MCHC: 33.9 g/dL (ref 32.0–36.0)
MCV: 94.9 fL (ref 80.0–100.0)
MPV: 12.6 fL — ABNORMAL HIGH (ref 7.5–12.5)
Monocytes Relative: 10.2 %
Neutro Abs: 2662 cells/uL (ref 1500–7800)
Neutrophils Relative %: 52.2 %
Platelets: 291 10*3/uL (ref 140–400)
RBC: 4.48 10*6/uL (ref 4.20–5.80)
RDW: 12 % (ref 11.0–15.0)
Total Lymphocyte: 34.6 %
WBC mixed population: 520 cells/uL (ref 200–950)
WBC: 5.1 10*3/uL (ref 3.8–10.8)

## 2017-08-10 LAB — LIPID PANEL
Cholesterol: 255 mg/dL — ABNORMAL HIGH (ref <200)
HDL: 79 mg/dL (ref >40)
LDL Cholesterol (Calc): 159 mg/dL (calc) — ABNORMAL HIGH
Non-HDL Cholesterol (Calc): 176 mg/dL (calc) — ABNORMAL HIGH (ref <130)
Total CHOL/HDL Ratio: 3.2 (calc) (ref <5.0)
Triglycerides: 71 mg/dL (ref <150)

## 2017-08-10 LAB — PSA: PSA: 1.7 ng/mL (ref <=4.0)

## 2017-08-14 NOTE — Progress Notes (Signed)
Subjective:    Patient ID: James [name], male    DOB: 05-21-1955, 62 y.o.   MRN: [id number]  HPI 62 year old White Male in today for health maintenance exam and evaluation of medical issues.  History of Barrett's esophagus and ulcerative colitis followed by Dr. Hilarie Fredrickson.  Patient declined rectal exam today because he is been having issues with diarrhea.  He takes PPI for GE reflux and is maintained on Lialda tablets and mesalamine suppositories.  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  History of central serous chorioretinopathy of right eye followed at [facility].  History of iron deficiency anemia related to ulcerative colitis.  He is seeing Dr. Ninfa Linden recently and is considering having partial knee replacement.  History of internal hemorrhoids.  One tubular adenoma noted on colonoscopy in 2013.  History of low back pain and meralgia paresthetica right leg.  Says Dr. 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  Status post right hip arthroplasty by Dr. Ninfa Linden February 2016.  No known drug allergies  History of smoking in the remote past he quit around the time of his hip replacement surgery in 2016.  Prior to that he smoked a pack of cigarettes daily for over 25 years.  History of snoring and sleep apnea.  He is on CPAP 5-15 cm of water  Past medical history: Fractured left forearm 1962, fractured right ankle 1971, left inguinal herniorrhaphy 1995.  He has significant hypercholesterolemia and was started on Crestor 5 mg 3 times a week.  Total cholesterol is increased from 2 33-2 55 over the past year.  LDL cholesterol has increased from 1 20-1 59.  He will follow-up in September.  Family history: Father died at age 55 with history of coronary artery disease, esophageal and colon cancer.  Mother with history of stroke and colon cancer deceased.  2 sisters in good health.  Social history: He is married.   Completed 2 years of college.  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  They live near [place].    Review of Systems  Respiratory:       History of sleep apnea  Cardiovascular: Negative.   Gastrointestinal: Positive for diarrhea.       History of ulcerative colitis  Genitourinary: Negative.   Neurological: Negative.   Psychiatric/Behavioral: Negative.        Objective:   Physical Exam  Constitutional: He is oriented to person, place, and time. He appears well-developed and well-nourished.  HENT:  Head: Atraumatic.  Right Ear: External ear normal.  Left Ear: External ear normal.  Mouth/Throat: Oropharynx is clear and moist. No oropharyngeal exudate.  Eyes: Pupils are equal, round, and reactive to light. Conjunctivae and EOM are normal. Right eye exhibits no discharge. Left eye exhibits no discharge.  Neck: Neck supple. No JVD present. No thyromegaly present.  Cardiovascular: Normal rate, regular rhythm and normal heart sounds.  No murmur heard. Pulmonary/Chest: Effort normal. No stridor. No respiratory distress. He has no wheezes.  Abdominal: Soft. Bowel sounds are normal. He exhibits no distension and no mass. There is no tenderness. There is no rebound and no guarding.  Genitourinary:  Genitourinary Comments: He defers prostate exam due to issues with diarrhea.  It was also deferred last year but will definitely need follow-up in September  Lymphadenopathy:    He has no cervical adenopathy.  Neurological: He is alert and oriented  to person, place, and time.  Skin: Skin is warm and dry.  Vitals reviewed.         Assessment & Plan:  Pure hypercholesterolemia-start Crestor 5 mg 3 times a week and follow-up in September  Status post arthroscopic surgery left knee with partial medial meniscectomy.  He cannot have steroid injections due to issue.  Insurance company will not cover hyaluronic acid.  He will consider partial knee  replacement.  Ulcerative colitis followed by Dr. Hilarie Fredrickson  History of iron deficiency anemia  History of sleep apnea treated with CPAP  Central chorioretinopathy followed into  GE reflux treated with PPI  History of iron deficiency anemia related to colitis-hemoglobin is normal  Plan: Follow-up here in September with fasting lipid panel liver functions with office visit.  Will need prostate exam at that time.

## 2017-08-15 ENCOUNTER — Telehealth: Payer: Self-pay | Admitting: Internal Medicine

## 2017-08-15 MED ORDER — ROSUVASTATIN CALCIUM 5 MG PO TABS
ORAL_TABLET | ORAL | 3 refills | Status: DC
Start: 2017-08-15 — End: 2018-07-23

## 2017-08-15 NOTE — Telephone Encounter (Signed)
Called patient back.  He stated he is not sure what it was.  Upon further clarification he stated this is something that he is supposed to take for cholesterol and he really didn't know the name of the medication.  States that he knows you talked about him taking this 3 times a week and following up.  He has a follow up in October scheduled and labs to be drawn for Lipid.  Sounds like he meant Crestor and perhaps this was to be 5 mg instead of Restoril.    He did know that the medication he was to be taking was for his cholesterol.

## 2017-08-15 NOTE — Telephone Encounter (Signed)
Please call and ask about this. May have asked for a refill. Is not to be taken regularly. Has he taken Restoril before?

## 2017-08-15 NOTE — Patient Instructions (Addendum)
Start Crestor 5 mg 3 times a week and follow-up in September with lipid panel and liver functions.  Continue other medications as previously prescribed.  Patient considering partial knee arthroplasty.

## 2017-08-15 NOTE — Telephone Encounter (Signed)
Patient calling because we failed to send in his Restoril to the pharmacy.  I don't see in your notes from 7/25 what the plan was to be for this medication.  Apparently this is a new medication for patient with this visit.    Pharmacy:  [facility]  Phone #:  [phone]  Thank you.

## 2017-08-27 ENCOUNTER — Other Ambulatory Visit: Payer: Self-pay | Admitting: Internal Medicine

## 2017-08-27 DIAGNOSIS — K51511 Left sided colitis with rectal bleeding: Secondary | ICD-10-CM

## 2017-08-28 ENCOUNTER — Telehealth: Payer: Self-pay

## 2017-08-28 NOTE — Telephone Encounter (Signed)
I have spoken to James [name] at [facility] [address]to ask whether they got script that we sent on 07/26/17 for Lialda #120 w/2 refills. She indicates that they did get script on 07/26/17 but there are no refills and James [name] picked up rx 07/26/17. I advised that the original script indicated to give 2 refills. She then saw where this was the case. She will fill one of the refills. I have spoken to the James [name] to advise him of this information and he thanked me for the help.

## 2017-08-31 DIAGNOSIS — H35713 Central serous chorioretinopathy, bilateral: Secondary | ICD-10-CM | POA: Diagnosis not present

## 2017-08-31 DIAGNOSIS — H43811 Vitreous degeneration, right eye: Secondary | ICD-10-CM | POA: Diagnosis not present

## 2017-08-31 DIAGNOSIS — H35051 Retinal neovascularization, unspecified, right eye: Secondary | ICD-10-CM | POA: Diagnosis not present

## 2017-09-28 ENCOUNTER — Ambulatory Visit (INDEPENDENT_AMBULATORY_CARE_PROVIDER_SITE_OTHER): Payer: 59 | Admitting: Internal Medicine

## 2017-09-28 DIAGNOSIS — Z23 Encounter for immunization: Secondary | ICD-10-CM

## 2017-09-28 NOTE — Progress Notes (Signed)
Flu vaccine given by CMA 

## 2017-09-28 NOTE — Patient Instructions (Signed)
Flu vaccine given.

## 2017-10-03 ENCOUNTER — Encounter: Payer: Self-pay | Admitting: Internal Medicine

## 2017-10-03 ENCOUNTER — Ambulatory Visit: Payer: 59 | Admitting: Internal Medicine

## 2017-10-03 VITALS — BP 122/78 | HR 82 | Ht 70.5 in | Wt 203.2 lb

## 2017-10-03 DIAGNOSIS — K5150 Left sided colitis without complications: Secondary | ICD-10-CM | POA: Diagnosis not present

## 2017-10-03 DIAGNOSIS — K2270 Barrett's esophagus without dysplasia: Secondary | ICD-10-CM | POA: Diagnosis not present

## 2017-10-03 DIAGNOSIS — K219 Gastro-esophageal reflux disease without esophagitis: Secondary | ICD-10-CM

## 2017-10-03 NOTE — Patient Instructions (Signed)
Continue Lialda.  Continue Canasa.  Continue Pantoprazole.   Please purchase the following medications over the counter and take as directed: Famotidine 20 mg every evening.  Please follow up with Dr Hilarie Fredrickson in 1 year. If symptoms are completely under control, let us know.  If you are age 63 or older, your body mass index should be between 23-30. Your Body mass index is 28.75 kg/m. If this is out of the aforementioned range listed, please consider follow up with your Primary Care Provider.  If you are age 73 or younger, your body mass index should be between 19-25. Your Body mass index is 28.75 kg/m. If this is out of the aformentioned range listed, please consider follow up with your Primary Care Provider.

## 2017-10-05 ENCOUNTER — Encounter: Payer: Self-pay | Admitting: Internal Medicine

## 2017-10-05 NOTE — Progress Notes (Signed)
Subjective:    Patient ID: James [name], male    DOB: 23-Jun-1955, 62 y.o.   MRN: [id number]  HPI James [name] is a 62 year old male with PMH of left-sided colitis diagnosed in 2018, history of Barrett's esophagus, history of iron deficiency anemia secondary to colitis, history of chorioretinopathy here for follow-up.  Last seen on 02/14/2017.  He is here alone today.  He has been maintained on Lialda 4.8 g daily and Canasa 1 g nightly.  He reports that on the whole he feels that he is doing well and certainly much better than prior to starting medications for his colitis.  Stools vary from formed occurring on a regular basis to explosive bouts of looser stool.  He has a slight sensation of incomplete evacuation.  He saw 1 day of scant blood in the last 3 to 5 months.  Bowel moods vary from 1 to 3 to 4/day.  No abdominal pain.  Diet has been more normal for him.  No new upper GI or hepatobiliary complaint.  He is taking pantoprazole 40 mg each morning.  He does have some occasional nighttime regurgitation type symptoms.  He notices this more after meals with bending over or belching.  No dysphagia or odynophagia.   Review of Systems As per HPI, otherwise negative  Current Medications, Allergies, Past Medical History, Past Surgical History, Family History and Social History were reviewed in [employer] record.     Objective:   Physical Exam BP 122/78   Pulse 82   Ht 5' 10.5" (1.791 m)   Wt 203 lb 4 oz (92.2 kg)   BMI 28.75 kg/m  Constitutional: Well-developed and well-nourished. No distress. HEENT: Normocephalic and atraumatic.  No scleral icterus. Neck: Neck supple. Trachea midline. Cardiovascular: Normal rate, regular rhythm and intact distal pulses. No M/R/G Pulmonary/chest: Effort normal and breath sounds normal. No wheezing, rales or rhonchi. Abdominal: Soft, nontender, nondistended. Bowel sounds active throughout. There are no masses palpable. No  hepatosplenomegaly. Extremities: no clubbing, cyanosis, or edema Neurological: Alert and oriented to person place and time. Skin: Skin is warm and dry. Psychiatric: Normal mood and affect. Behavior is normal.  CBC    Component Value Date/Time   WBC 5.1 08/09/2017 1013   RBC 4.48 08/09/2017 1013   HGB 14.4 08/09/2017 1013   HCT 42.5 08/09/2017 1013   PLT 291 08/09/2017 1013   MCV 94.9 08/09/2017 1013   MCH 32.1 08/09/2017 1013   MCHC 33.9 08/09/2017 1013   RDW 12.0 08/09/2017 1013   LYMPHSABS 1,765 08/09/2017 1013   MONOABS 0.6 09/28/2016 1600   EOSABS 102 08/09/2017 1013   BASOSABS 51 08/09/2017 1013   CMP     Component Value Date/Time   NA 136 08/09/2017 1013   K 5.2 08/09/2017 1013   CL 103 08/09/2017 1013   CO2 26 08/09/2017 1013   GLUCOSE 93 08/09/2017 1013   BUN 12 08/09/2017 1013   CREATININE 0.87 08/09/2017 1013   CALCIUM 10.3 08/09/2017 1013   PROT 7.3 08/09/2017 1013   ALBUMIN 4.3 09/28/2016 1600   AST 20 08/09/2017 1013   ALT 11 08/09/2017 1013   ALKPHOS 53 09/28/2016 1600   BILITOT 0.8 08/09/2017 1013   GFRNONAA 92 08/09/2017 1013   GFRAA 106 08/09/2017 1013       Assessment & Plan:  62 year old male with PMH of left-sided colitis diagnosed in 2018, history of Barrett's esophagus, history of iron deficiency anemia secondary to colitis, history  of chorioretinopathy here for follow-up.   1.  Left-sided colitis --clinically he is doing well.  He does have bouts of explosive somewhat urgent stooling and this could be due to lack of 100% control of his colitis.  We discussed this today.  Clinically overall he is much improved.  There was mild activity to the colitis when seen on follow-up flex sig last year.  For now we have decided to continue Lialda 4.8 g daily and Canasa 1 g nightly.  If symptoms began that would suggest uncontrolled colitis then my recommendation would be to repeat colonoscopy and then can sit or how therapy could be escalated such as  beginning Entyvio versus an anti-TNF agent. Annual follow-up  2.  GERD/history of Barrett's esophagus --some mild regurgitation type symptoms at night.  We will continue pantoprazole 40 mg each morning and add famotidine 20 mg in the evening.  Surveillance endoscopy at the 3-year interval  25 minutes spent with the patient today. Greater than 50% was spent in counseling and coordination of care with the patient

## 2017-10-09 ENCOUNTER — Other Ambulatory Visit: Payer: Self-pay | Admitting: Internal Medicine

## 2017-10-09 DIAGNOSIS — E7800 Pure hypercholesterolemia, unspecified: Secondary | ICD-10-CM

## 2017-10-09 DIAGNOSIS — Z79899 Other long term (current) drug therapy: Secondary | ICD-10-CM

## 2017-10-09 DIAGNOSIS — Z5181 Encounter for therapeutic drug level monitoring: Secondary | ICD-10-CM

## 2017-10-12 ENCOUNTER — Other Ambulatory Visit: Payer: 59 | Admitting: Internal Medicine

## 2017-10-12 DIAGNOSIS — E7800 Pure hypercholesterolemia, unspecified: Secondary | ICD-10-CM | POA: Diagnosis not present

## 2017-10-12 DIAGNOSIS — Z5181 Encounter for therapeutic drug level monitoring: Secondary | ICD-10-CM

## 2017-10-12 DIAGNOSIS — Z79899 Other long term (current) drug therapy: Secondary | ICD-10-CM

## 2017-10-12 LAB — HEPATIC FUNCTION PANEL
AG Ratio: 1.7 (calc) (ref 1.0–2.5)
ALT: 15 U/L (ref 9–46)
AST: 26 U/L (ref 10–35)
Albumin: 4.5 g/dL (ref 3.6–5.1)
Alkaline phosphatase (APISO): 44 U/L (ref 40–115)
Bilirubin, Direct: 0.2 mg/dL (ref 0.0–0.2)
Globulin: 2.6 g/dL (calc) (ref 1.9–3.7)
Indirect Bilirubin: 0.6 mg/dL (calc) (ref 0.2–1.2)
Total Bilirubin: 0.8 mg/dL (ref 0.2–1.2)
Total Protein: 7.1 g/dL (ref 6.1–8.1)

## 2017-10-12 LAB — LIPID PANEL
Cholesterol: 175 mg/dL (ref <200)
HDL: 72 mg/dL (ref >40)
LDL Cholesterol (Calc): 85 mg/dL (calc)
Non-HDL Cholesterol (Calc): 103 mg/dL (calc) (ref <130)
Total CHOL/HDL Ratio: 2.4 (calc) (ref <5.0)
Triglycerides: 87 mg/dL (ref <150)

## 2017-10-17 ENCOUNTER — Ambulatory Visit: Payer: 59 | Admitting: Internal Medicine

## 2017-10-17 ENCOUNTER — Encounter: Payer: Self-pay | Admitting: Internal Medicine

## 2017-10-17 VITALS — BP 110/70 | HR 72 | Ht 71.0 in | Wt 198.0 lb

## 2017-10-17 DIAGNOSIS — E7800 Pure hypercholesterolemia, unspecified: Secondary | ICD-10-CM

## 2017-10-17 DIAGNOSIS — Z7184 Encounter for health counseling related to travel: Secondary | ICD-10-CM | POA: Diagnosis not present

## 2017-10-17 MED ORDER — CLARITHROMYCIN 500 MG PO TABS: 500.0000 mg | ORAL_TABLET | Freq: Two times a day (BID) | ORAL | 0 refills | Status: DC

## 2017-10-17 MED ORDER — HYDROCODONE-HOMATROPINE 5-1.5 MG/5ML PO SYRP: 5.0000 mL | ORAL_SOLUTION | Freq: Three times a day (TID) | ORAL | 0 refills | Status: DC | PRN

## 2017-10-19 ENCOUNTER — Telehealth: Payer: Self-pay

## 2017-10-19 DIAGNOSIS — G4700 Insomnia, unspecified: Secondary | ICD-10-CM

## 2017-10-19 MED ORDER — LORAZEPAM 1 MG PO TABS
1.0000 mg | ORAL_TABLET | Freq: Every day | ORAL | 0 refills | Status: DC
Start: 2017-10-19 — End: 2018-05-21

## 2017-10-19 NOTE — Telephone Encounter (Addendum)
Patient is calling to request a medication to help him sleep while he is in the plane.   Called in Ativan 1 mg #30 one po qhs with no refills. Has trips planned to [place] and [place]

## 2017-10-25 ENCOUNTER — Ambulatory Visit (INDEPENDENT_AMBULATORY_CARE_PROVIDER_SITE_OTHER): Payer: 59 | Admitting: Orthopaedic Surgery

## 2017-10-27 DIAGNOSIS — G4733 Obstructive sleep apnea (adult) (pediatric): Secondary | ICD-10-CM | POA: Diagnosis not present

## 2017-11-01 ENCOUNTER — Other Ambulatory Visit: Payer: Self-pay | Admitting: Internal Medicine

## 2017-11-01 DIAGNOSIS — K51511 Left sided colitis with rectal bleeding: Secondary | ICD-10-CM

## 2017-11-04 ENCOUNTER — Encounter: Payer: Self-pay | Admitting: Internal Medicine

## 2017-11-04 NOTE — Patient Instructions (Signed)
Biaxin 500 mg twice daily for 10 days to take with him on sas well as Hycodan 1 teaspoon p.o. every 8 hours as needed cough.  May take Ativan 1 mg at bedtime as needed for sleep following trips.  Rectal exam is normal.  Continue Crestor 5 mg 3 times a week.  Physical exam due July 2020

## 2017-11-04 NOTE — Progress Notes (Signed)
   Subjective:    Patient ID: James [name], male    DOB: Oct 14, 1955, 62 y.o.   MRN: [id number]  HPI 62 year old Male for follow-up of hyperlipidemia.  He was here for physical exam in July was placed on Crestor 5 mg 3 times a week.  He has Barrett's esophagus and ulcerative colitis followed by Dr. Hilarie Fredrickson.  In July total cholesterol was 255 with an LDL cholesterol of 159, HDL of 79 and triglycerides of 71.  Now his cholesterol is 175, HDL 72, triglycerides 87 and LDL 85.  Liver functions are normal.  He is going to [place] to see the [place] playing and then on to [place] for vacation.  His wife is going with him on both trips.  He request antibiotics to take with him.  Biaxin was sent in for him as well as Hycodan cough syrup.  He requests something for sleep on the plane.  Ativan 1 mg tablets at bedtime sent in for him.    Review of Systems no new complaints     Objective:   Physical Exam Rectal exam performed today is normal.  He had asked that it be deferred at last visit in July because of diarrhea.       Assessment & Plan:  Travel advice encounter-Biaxin and Hycodan [place] along with Ativan for sleep  Pure hypercholesterolemia- Crestor 5 mg 3 times a week is normalized his lipid panel and liver functions are normal  Ulcerative colitis-continue to be followed by Dr. Hilarie Fredrickson  Plan: Physical exam due July 2020.  Continue Crestor 5 mg 3 times weekly.  25 minutes spent with patient

## 2017-11-17 ENCOUNTER — Ambulatory Visit: Payer: 59 | Admitting: Internal Medicine

## 2017-11-17 ENCOUNTER — Encounter: Payer: Self-pay | Admitting: Internal Medicine

## 2017-11-17 VITALS — BP 140/90 | HR 91 | Temp 98.3°F | Ht 71.0 in | Wt 191.0 lb

## 2017-11-17 DIAGNOSIS — R69 Illness, unspecified: Secondary | ICD-10-CM

## 2017-11-17 DIAGNOSIS — J069 Acute upper respiratory infection, unspecified: Secondary | ICD-10-CM | POA: Diagnosis not present

## 2017-11-17 DIAGNOSIS — T753XXA Motion sickness, initial encounter: Secondary | ICD-10-CM | POA: Diagnosis not present

## 2017-12-10 NOTE — Progress Notes (Signed)
   Subjective:    Patient ID: James [name], male    DOB: Aug 21, 1955, 62 y.o.   MRN: [id number]  HPI He returned from [place] only to come down with another respiratory infection.  He is going to [place] soon.  Has cough and congestion.  No fever or shaking chills.  Some malaise and fatigue.    Review of Systems see above     Objective:   Physical Exam Sounds nasally congested when he speaks.  Pharynx slightly injected without exudate.  TMs clear.  Neck is supple.  No adenopathy.  Chest clear to auscultation.       Assessment & Plan:  Recurrent upper respiratory infection following international travel  Plan: Repeat course of Biaxin 500 mg twice daily for 10 days.  Hycodan if needed for cough 1 teaspoon p.o. every 8 to 12 hours.  Rest and drink plenty of fluids.

## 2017-12-15 NOTE — Patient Instructions (Signed)
Repeat course of Biaxin 500 mg twice daily for 10 days.  Hycodan if needed for cough.  Rest and drink plenty of fluids.

## 2017-12-28 ENCOUNTER — Other Ambulatory Visit: Payer: Self-pay | Admitting: Internal Medicine

## 2017-12-28 DIAGNOSIS — K51511 Left sided colitis with rectal bleeding: Secondary | ICD-10-CM

## 2017-12-29 ENCOUNTER — Other Ambulatory Visit: Payer: Self-pay | Admitting: Internal Medicine

## 2017-12-29 DIAGNOSIS — K51511 Left sided colitis with rectal bleeding: Secondary | ICD-10-CM

## 2018-05-09 ENCOUNTER — Telehealth (INDEPENDENT_AMBULATORY_CARE_PROVIDER_SITE_OTHER): Payer: Self-pay | Admitting: Orthopaedic Surgery

## 2018-05-16 ENCOUNTER — Telehealth (INDEPENDENT_AMBULATORY_CARE_PROVIDER_SITE_OTHER): Payer: Self-pay | Admitting: Orthopaedic Surgery

## 2018-05-16 MED ORDER — HYDROCODONE-ACETAMINOPHEN 5-325 MG PO TABS: 1.0000 | ORAL_TABLET | Freq: Four times a day (QID) | ORAL | 0 refills | Status: DC | PRN

## 2018-05-16 NOTE — Telephone Encounter (Signed)
Pt called asking if he can have a refill on his hydrocodone.

## 2018-05-16 NOTE — Telephone Encounter (Signed)
I'll send some in.

## 2018-05-16 NOTE — Telephone Encounter (Signed)
Pt advised.

## 2018-05-16 NOTE — Telephone Encounter (Signed)
Please advise 

## 2018-05-21 ENCOUNTER — Encounter: Payer: Self-pay | Admitting: Internal Medicine

## 2018-05-21 ENCOUNTER — Telehealth: Payer: Self-pay | Admitting: Internal Medicine

## 2018-05-21 ENCOUNTER — Ambulatory Visit (INDEPENDENT_AMBULATORY_CARE_PROVIDER_SITE_OTHER): Payer: 59 | Admitting: Internal Medicine

## 2018-05-21 VITALS — Ht 71.0 in | Wt 191.0 lb

## 2018-05-21 DIAGNOSIS — B349 Viral infection, unspecified: Secondary | ICD-10-CM

## 2018-05-21 NOTE — Telephone Encounter (Signed)
OK 

## 2018-05-21 NOTE — Telephone Encounter (Signed)
James [name] [id number]  James [name] called to say he ran fever all day yesterday 99.2, prior to that he had some drainage and sore throat, does not think related. He is wandering if it is okay to go around others. I suggested virtual visit for this afternoon, he is good with that.

## 2018-05-21 NOTE — Progress Notes (Signed)
   Subjective:    Patient ID: James [name], male    DOB: 01/11/1956, 63 y.o.   MRN: [id number]  HPI 63 year old Male seen today by interactive audio and video telecommunications due to the coronavirus pandemic.  He is identified as James [name], using 2 identifiers, a patient in this practice  He is agreeable to visit in this format today.  Patient says he has had a low-grade fever for couple of days with some intermittent diarrhea.  No shaking chills.  No myalgias.  Of course, he is worried about COVID-19.  He has not traveled recently.  His wife is asymptomatic.  No high fever.  Temperature 99 to 100 degrees  Review of Systems some malaise and fatigue, no abdominal pain.  Patient mainly worried about low-grade temperature.     Objective:   Physical Exam  Unable to examine patient in this venue.  He appears to be in no acute distress.      Assessment & Plan:  Suspect this is a mild viral syndrome/gastroenteritis  Plan: Recommend clear liquids for 24 to 48 hours and advancing diet slowly.  Tylenol as needed for fever.  Call if symptoms worsen or if fever not resolving in the next 2 or 3 days.  Rest and drink plenty of fluids.  Stay at home.

## 2018-05-21 NOTE — Telephone Encounter (Signed)
Scheduled virtual Visit

## 2018-05-30 ENCOUNTER — Other Ambulatory Visit: Payer: Self-pay | Admitting: Internal Medicine

## 2018-06-01 ENCOUNTER — Telehealth: Payer: Self-pay | Admitting: Internal Medicine

## 2018-06-01 NOTE — Telephone Encounter (Signed)
Pt may have to cancel his insurance in a few months. Wanted to know if Dr. Hilarie Fredrickson feels it may be ok for him to take 2 lialda a day and his suppository every other day to make his meds last longer. He thinks if he can do this he would only have to go about 4 months without meds until he would have medicare. He wanted to see what Dr. Hilarie Fredrickson feels about this. Also encouraged pt to look into pt assistance with the company that makes [employer]. Please advise.

## 2018-06-04 NOTE — Telephone Encounter (Signed)
I agree that he should look in to assistance programs from the drug manufacturers  I also think that we can try to reduce the doses of Lialda and Canasa but doing so may cause a flare of his colitis.  If this happens he needs to let me know immediately. While still not inexpensive, Lialda is available at [employer] at 4.8 g/day through the good [email or web address] program for $179 per month --he may want to look into this if this is more affordable than what he is getting through his prescription insurance program. Georgiann Mccoy is available for $166 per month through [employer] on good [email or web address]  Would arrange a virtual visit, to discuss

## 2018-06-05 NOTE — Telephone Encounter (Signed)
I have spoken to patient to advise of Dr Vena Rua recommendations. He states that pricing through GoodRx is actually a lot more affordable than what he is currently paying. I have sent him GoodRx coupon cards to his mailing address. He is advised that he should use Kristopher Oppenheim for the best pricing. In addition, I have offered to schedule him for a virtual office visit but he would like to call back to schedule when he has his calender available.

## 2018-06-12 ENCOUNTER — Other Ambulatory Visit: Payer: Self-pay | Admitting: Internal Medicine

## 2018-06-12 DIAGNOSIS — K51511 Left sided colitis with rectal bleeding: Secondary | ICD-10-CM

## 2018-06-13 NOTE — Patient Instructions (Signed)
Rest, monitor symptoms and call if fever not resolving in next 2-3 days or symptoms worsen.

## 2018-07-09 ENCOUNTER — Other Ambulatory Visit: Payer: Self-pay | Admitting: Internal Medicine

## 2018-07-09 DIAGNOSIS — K51511 Left sided colitis with rectal bleeding: Secondary | ICD-10-CM

## 2018-07-17 ENCOUNTER — Encounter: Payer: Self-pay | Admitting: Internal Medicine

## 2018-07-17 ENCOUNTER — Ambulatory Visit (INDEPENDENT_AMBULATORY_CARE_PROVIDER_SITE_OTHER): Payer: 59 | Admitting: Internal Medicine

## 2018-07-17 VITALS — Ht 70.5 in | Wt 192.0 lb

## 2018-07-17 DIAGNOSIS — K2270 Barrett's esophagus without dysplasia: Secondary | ICD-10-CM

## 2018-07-17 DIAGNOSIS — Z86010 Personal history of colonic polyps: Secondary | ICD-10-CM | POA: Diagnosis not present

## 2018-07-17 DIAGNOSIS — K51511 Left sided colitis with rectal bleeding: Secondary | ICD-10-CM

## 2018-07-17 DIAGNOSIS — K219 Gastro-esophageal reflux disease without esophagitis: Secondary | ICD-10-CM | POA: Diagnosis not present

## 2018-07-17 DIAGNOSIS — K5150 Left sided colitis without complications: Secondary | ICD-10-CM

## 2018-07-17 NOTE — Progress Notes (Signed)
Subjective:    Patient ID: James [name], male    DOB: [birth year], 63 y.o.   MRN: [id number]   This service was provided via telemedicine.  Doximity app with A/V communication The patient was located at home The provider was located in provider's GI office. The patient did consent to this telephone visit and is aware of possible charges through their insurance for this visit.   The persons participating in this telemedicine service were the patient and I. 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  Seen virtually today in the setting of COVID-19.  Last seen in the office on 10/03/2017.    He has been maintained on [place].  He had been using Lialda 4.8 g a day and Canasa 1 g nightly.  Over the expense he had called James [name], CMA to discuss prices and options given the expense of the medication.  We determined that through good Rx he could get the medication for relatively good price at [employer].  Since this time he has been experimenting with Lialda and taking the 4.8 g every other day and the Canasa 1 g nightly every other night and has not really seen a change in his colitis symptoms.  He is feeling well.  No abdominal pain.  Stools are formed and 1/day.  No diarrhea.  No blood in stool.  No melena.  No tenesmus.  He has been free from any upper respiratory illnesses without cough, shortness of breath and chest pain.  No recent ocular complaints.  He needs to follow-up with his [facility] ophthalmologist as several appointments were missed in the setting of COVID-19 pandemic.  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  He has been strictly avoiding steroids including topical over-the-counter  steroid creams.  Heartburn well controlled on pantoprazole 40 mg a day.  He will occasionally use Tums for mild breakthrough nocturnal symptoms.  No dysphagia or odynophagia.  He does plan on entering Medicare next May.  He is currently paying $4400 a month for medical insurance for he and his wife.  EGD, colonoscopy plus flexible sigmoidoscopy from 2018 reviewed today.  Review of Systems As per HPI, otherwise negative  Current Medications, Allergies, Past Medical History, Past Surgical History, Family History and Social History were reviewed in [employer] record.      Objective:   Physical Exam No physical exam, virtual visit    CBC    Component Value Date/Time   WBC 5.1 08/09/2017 1013   RBC 4.48 08/09/2017 1013   HGB 14.4 08/09/2017 1013   HCT 42.5 08/09/2017 1013   PLT 291 08/09/2017 1013   MCV 94.9 08/09/2017 1013   MCH 32.1 08/09/2017 1013   MCHC 33.9 08/09/2017 1013   RDW 12.0 08/09/2017 1013   LYMPHSABS 1,765 08/09/2017 1013   MONOABS 0.6 09/28/2016 1600   EOSABS 102 08/09/2017 1013   BASOSABS 51 08/09/2017 1013   CMP     Component Value Date/Time   NA 136 08/09/2017 1013   K 5.2 08/09/2017 1013   CL 103 08/09/2017 1013   CO2 26 08/09/2017 1013   GLUCOSE 93 08/09/2017 1013   BUN 12 08/09/2017 1013   CREATININE 0.87 08/09/2017 1013  CALCIUM 10.3 08/09/2017 1013   PROT 7.1 10/12/2017 0940   ALBUMIN 4.3 09/28/2016 1600   AST 26 10/12/2017 0940   ALT 15 10/12/2017 0940   ALKPHOS 53 09/28/2016 1600   BILITOT 0.8 10/12/2017 0940   GFRNONAA 92 08/09/2017 1013   GFRAA 106 08/09/2017 1013        Assessment & Plan:  63 year old male with a history of left-sided colitis diagnosed in 2018, history of Barrett's esophagus without dysplasia (2018) and GERD, history 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  1.  Left-sided ulcerative colitis  --he is doing well.  No active symptoms of colitis.  I am going to dose titrate medications downward but he knows to let me know if he has any return of colitis symptoms such as abdominal pain, tenesmus, blood in stool or diarrhea. --Decrease to Lialda 2.4 g daily; leave current prescription as is --Okay to try Canasa 1 g every other night at bedtime --Annual follow-up  2.  GERD with history of Barrett's esophagus --doing well without frequent heartburn or dysphagia.  Due surveillance endoscopy next May.  Continue pantoprazole 40 mg daily. --Continue pantoprazole 40 mg daily --Surveillance upper endoscopy May 2021.  We discussed this today.  3.  History of adenomatous and sessile serrated polyps --last colonoscopy with 1 7 mm SSP in 2018, repeat recommended March 2023

## 2018-07-18 MED ORDER — MESALAMINE 1000 MG RE SUPP: 1000.0000 mg | Freq: Every day | RECTAL | 3 refills | Status: DC

## 2018-07-18 MED ORDER — LIALDA 1.2 G PO TBEC: 4.8000 g | DELAYED_RELEASE_TABLET | Freq: Every day | ORAL | 3 refills | Status: DC

## 2018-07-18 NOTE — Patient Instructions (Addendum)
Decrease Lialda to 2.4 g (2 tablets) daily; we will leave current prescription as is in case we need to increase dosage again.  You may try to use Canasa 1 g every other night at bedtime.  Please follow up with Dr Hilarie Fredrickson in 1 year.  Continue pantoprazole 40 mg daily.  You will be due for a recall endonoscopy in 05/2019. We will send you a reminder in the mail when it gets closer to that time.  If you are age 64 or older, your body mass index should be between 23-30. Your Body mass index is 27.16 kg/m. If this is out of the aforementioned range listed, please consider follow up with your Primary Care Provider.  If you are age 50 or younger, your body mass index should be between 19-25. Your Body mass index is 27.16 kg/m. If this is out of the aformentioned range listed, please consider follow up with your Primary Care Provider.

## 2018-07-18 NOTE — Addendum Note (Signed)
Addended by: Larina Bras on: 07/18/2018 10:13 AM   Modules accepted: Orders

## 2018-07-22 ENCOUNTER — Other Ambulatory Visit: Payer: Self-pay | Admitting: Internal Medicine

## 2018-08-11 ENCOUNTER — Other Ambulatory Visit: Payer: Self-pay | Admitting: Internal Medicine

## 2018-08-11 DIAGNOSIS — K51511 Left sided colitis with rectal bleeding: Secondary | ICD-10-CM

## 2018-08-14 ENCOUNTER — Other Ambulatory Visit: Payer: 59 | Admitting: Internal Medicine

## 2018-08-16 ENCOUNTER — Other Ambulatory Visit: Payer: Self-pay

## 2018-08-16 ENCOUNTER — Ambulatory Visit: Payer: 59 | Admitting: Internal Medicine

## 2018-08-16 ENCOUNTER — Encounter: Payer: Self-pay | Admitting: Internal Medicine

## 2018-08-16 VITALS — BP 120/80 | HR 58 | Ht 70.5 in | Wt 192.0 lb

## 2018-08-16 DIAGNOSIS — G4733 Obstructive sleep apnea (adult) (pediatric): Secondary | ICD-10-CM | POA: Diagnosis not present

## 2018-08-16 DIAGNOSIS — K51318 Ulcerative (chronic) rectosigmoiditis with other complication: Secondary | ICD-10-CM | POA: Diagnosis not present

## 2018-08-16 DIAGNOSIS — Z96641 Presence of right artificial hip joint: Secondary | ICD-10-CM

## 2018-08-16 DIAGNOSIS — K219 Gastro-esophageal reflux disease without esophagitis: Secondary | ICD-10-CM | POA: Diagnosis not present

## 2018-08-16 DIAGNOSIS — Z0000 Encounter for general adult medical examination without abnormal findings: Secondary | ICD-10-CM

## 2018-08-16 DIAGNOSIS — E7800 Pure hypercholesterolemia, unspecified: Secondary | ICD-10-CM

## 2018-08-16 LAB — POCT URINALYSIS DIPSTICK
Appearance: NEGATIVE
Bilirubin, UA: NEGATIVE
Blood, UA: NEGATIVE
Glucose, UA: NEGATIVE
Ketones, UA: NEGATIVE
Leukocytes, UA: NEGATIVE
Odor: NEGATIVE
Protein, UA: NEGATIVE
Spec Grav, UA: 1.02 (ref 1.010–1.025)
Urobilinogen, UA: 0.2 E.U./dL
pH, UA: 6 (ref 5.0–8.0)

## 2018-08-17 ENCOUNTER — Other Ambulatory Visit: Payer: Self-pay

## 2018-08-17 LAB — COMPLETE METABOLIC PANEL WITH GFR
AG Ratio: 1.7 (calc) (ref 1.0–2.5)
ALT: 12 U/L (ref 9–46)
AST: 21 U/L (ref 10–35)
Albumin: 4.6 g/dL (ref 3.6–5.1)
Alkaline phosphatase (APISO): 52 U/L (ref 35–144)
BUN: 12 mg/dL (ref 7–25)
CO2: 27 mmol/L (ref 20–32)
Calcium: 10.5 mg/dL — ABNORMAL HIGH (ref 8.6–10.3)
Chloride: 101 mmol/L (ref 98–110)
Creat: 0.94 mg/dL (ref 0.70–1.25)
GFR, Est African American: 99 mL/min/{1.73_m2} (ref >=60)
GFR, Est Non African American: 85 mL/min/{1.73_m2} (ref >=60)
Globulin: 2.7 g/dL (calc) (ref 1.9–3.7)
Glucose, Bld: 106 mg/dL — ABNORMAL HIGH (ref 65–99)
Potassium: 5 mmol/L (ref 3.5–5.3)
Sodium: 138 mmol/L (ref 135–146)
Total Bilirubin: 0.6 mg/dL (ref 0.2–1.2)
Total Protein: 7.3 g/dL (ref 6.1–8.1)

## 2018-08-17 LAB — LIPID PANEL
Cholesterol: 226 mg/dL — ABNORMAL HIGH (ref <200)
HDL: 87 mg/dL (ref >=40)
LDL Cholesterol (Calc): 123 mg/dL (calc) — ABNORMAL HIGH
Non-HDL Cholesterol (Calc): 139 mg/dL (calc) — ABNORMAL HIGH (ref <130)
Total CHOL/HDL Ratio: 2.6 (calc) (ref <5.0)
Triglycerides: 64 mg/dL (ref <150)

## 2018-08-17 LAB — CBC WITH DIFFERENTIAL/PLATELET
Absolute Monocytes: 482 cells/uL (ref 200–950)
Basophils Absolute: 21 cells/uL (ref 0–200)
Basophils Relative: 0.4 %
Eosinophils Absolute: 90 cells/uL (ref 15–500)
Eosinophils Relative: 1.7 %
HCT: 42.9 % (ref 38.5–50.0)
Hemoglobin: 14.3 g/dL (ref 13.2–17.1)
Lymphs Abs: 1405 cells/uL (ref 850–3900)
MCH: 31.9 pg (ref 27.0–33.0)
MCHC: 33.3 g/dL (ref 32.0–36.0)
MCV: 95.8 fL (ref 80.0–100.0)
MPV: 12.7 fL — ABNORMAL HIGH (ref 7.5–12.5)
Monocytes Relative: 9.1 %
Neutro Abs: 3302 cells/uL (ref 1500–7800)
Neutrophils Relative %: 62.3 %
Platelets: 289 10*3/uL (ref 140–400)
RBC: 4.48 10*6/uL (ref 4.20–5.80)
RDW: 11.9 % (ref 11.0–15.0)
Total Lymphocyte: 26.5 %
WBC: 5.3 10*3/uL (ref 3.8–10.8)

## 2018-08-17 LAB — PSA: PSA: 1.1 ng/mL (ref <=4.0)

## 2018-08-17 MED ORDER — ROSUVASTATIN CALCIUM 5 MG PO TABS: ORAL_TABLET | ORAL | 1 refills | Status: DC

## 2018-09-09 NOTE — Patient Instructions (Signed)
It was a pleasure to see you today.  Watch diet and get some exercise.  Take lipid-lowering medication daily instead of 3 times weekly and follow-up in 6 months.  Have flu vaccine.

## 2018-09-09 NOTE — Progress Notes (Signed)
Subjective:    Patient ID: James [name], male    DOB: 03-26-1955, 63 y.o.   MRN: [id number]  HPI 63 year old Male in today for health maintenance exam and evaluation of medical issues.  History of Barrett's esophagus and ulcerative colitis followed by Dr. Hilarie Fredrickson.  He takes PPI for GE reflux and is maintained on Lialda tablets and mesalamine suppositories.  History of hyperlipidemia and will be started on Crestor 5 mg daily.  History of tubular adenomas followed by gastroenterologist.  History of osteoarthritis of right hip and is status post right hip replacement by Dr. Ninfa Linden February 2016.  He had left knee medial meniscectomy in 2019.  He cannot have steroid injections due to optical issues with his eyes.  History of iron deficiency anemia thought to be secondary to inflammatory bowel disease.  Has been maintained on iron supplement.  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 Helane Rima at [facility] in [place].  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  History of snoring and sleep apnea.  He is on CPAP.  He has had issues with his right scapula in the past but has not complained of that recently.  He had an MRI which did not see a reason for a palpable abnormality and snapping of his scapula.  Fracture left forearm 1962, fractured right ankle 1971, left inguinal herniorrhaphy 1995.  In 2013 he had a colonoscopy and was found to have a small focus of colitis and one tubular adenoma but he never went back for follow-up.  History of internal hemorrhoids.  No known drug allergies  Zostavax vaccine in 2016.  Tetanus immunization is up-to-date.  He will take flu vaccine in the near future.  History of low back pain and merralgia paresthetica right leg.  He saw Dr. 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  Family history: Father died at age 43 with history of coronary artery disease, esophageal and colon  cancer.  Mother with history of stroke and colon cancer and is deceased.  2 sisters in good health.  Social history: He is married.  Completed 2 years of college.  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  They live near [place].  They like to travel.       Review of Systems see above     Objective:   Physical Exam Blood pressure 120/80 pulse 58 BMI 27.16 pulse oximetry 98% weight 192 pounds.  Skin warm and dry.  Nodes none.  Neck is supple without JVD thyromegaly or carotid bruits.  Chest clear to auscultation.  Cardiac exam regular rate and rhythm normal S1 and S2 without murmurs or gallops.  Abdomen no hepatosplenomegaly masses or tenderness.  Extremities without deformity.  Neuro no focal deficits on brief neurological exam.  Rectal exam prostate is normal without nodules .  Neuro no focal deficits.  Psychiatric: Judgment and affect are normal.       Assessment & Plan:  Re: Pure hypercholesterolemia - had been placed on Crestor 5 mg 3 times a week but during the pandemic his total cholesterol has increased to 226 and LDL cholesterol 123.  Previously in the fall 2019 his lipids were normal.  He is going to take Crestor 5 mg daily.  Liver functions are normal.  Fasting glucose is 106.  Needs to watch diet.  PSA is normal  Inflammatory bowel disease followed  by Dr. Hilarie Fredrickson  GE reflux treated with PPI  History of sleep apnea  Status post right hip arthroplasty  Central chorioretinopathy followed by Dr. Helane Rima.  Has been told not to have steroid injections.  Status post arthroscopic surgery left knee with partial medial meniscectomy  Plan: Return in 6 months or as needed for follow-up on hyperlipidemia.  Watch diet and try to get some exercise.  No change in medications other than Crestor will be taken 5 mg daily instead of 3 times weekly.  Have flu vaccine in the near future.

## 2018-09-27 ENCOUNTER — Other Ambulatory Visit: Payer: Self-pay | Admitting: Internal Medicine

## 2018-09-27 ENCOUNTER — Other Ambulatory Visit: Payer: Self-pay

## 2018-09-27 DIAGNOSIS — E7800 Pure hypercholesterolemia, unspecified: Secondary | ICD-10-CM

## 2018-09-27 LAB — LIPID PANEL
Cholesterol: 210 mg/dL — ABNORMAL HIGH (ref <200)
HDL: 90 mg/dL (ref >=40)
LDL Cholesterol (Calc): 104 mg/dL (calc) — ABNORMAL HIGH
Non-HDL Cholesterol (Calc): 120 mg/dL (calc) (ref <130)
Total CHOL/HDL Ratio: 2.3 (calc) (ref <5.0)
Triglycerides: 69 mg/dL (ref <150)

## 2018-10-31 ENCOUNTER — Telehealth: Payer: Self-pay | Admitting: Internal Medicine

## 2018-10-31 MED ORDER — ROSUVASTATIN CALCIUM 5 MG PO TABS: ORAL_TABLET | ORAL | 5 refills | Status: DC

## 2018-10-31 NOTE — Telephone Encounter (Signed)
Received Fax RX request from  [facility]  Medication - rosuvastatin (CRESTOR) 5 MG tablet    Last Refill - 10/01/18  Last OV - 08/16/18  Last CPE - 08/16/18

## 2019-01-25 ENCOUNTER — Telehealth: Payer: Self-pay

## 2019-01-25 ENCOUNTER — Encounter: Payer: Self-pay | Admitting: Internal Medicine

## 2019-01-25 ENCOUNTER — Ambulatory Visit (INDEPENDENT_AMBULATORY_CARE_PROVIDER_SITE_OTHER): Payer: Self-pay | Admitting: Internal Medicine

## 2019-01-25 ENCOUNTER — Other Ambulatory Visit: Payer: Self-pay

## 2019-01-25 VITALS — BP 110/70 | HR 60 | Temp 98.5°F | Ht 70.5 in | Wt 194.0 lb

## 2019-01-25 DIAGNOSIS — S0101XA Laceration without foreign body of scalp, initial encounter: Secondary | ICD-10-CM

## 2019-01-25 DIAGNOSIS — Z23 Encounter for immunization: Secondary | ICD-10-CM

## 2019-01-25 NOTE — Telephone Encounter (Signed)
I called patient and he will come in. No LOC but has scalp laceration and needs Tdap.

## 2019-01-25 NOTE — Telephone Encounter (Signed)
Patient fell off of a machine and hit the top of his head he has a cut on the top of his head about 2 to 2.5 inches long it bled, bleeding has stopped. He said he fell 3-4.5 feet to the floor. He did not lose consciousness, no vomiting or light headed. He would like to know to know what to do.   [phone]

## 2019-01-28 ENCOUNTER — Telehealth: Payer: Self-pay

## 2019-01-28 NOTE — Telephone Encounter (Signed)
Would suggest he just let the strips fall out on their own. It will take about 7-10 days for wound to heal.

## 2019-01-28 NOTE — Telephone Encounter (Signed)
Patient called to give you an update on his head injury he said he is doing good he was able to glue the strips to his scalp they are still on his head, he said the injury is just sore, and he wanted to thank you for seeing him on Friday.

## 2019-02-09 NOTE — Patient Instructions (Signed)
Tetanus immunization update given.  Clean laceration with peroxide and apply Bactroban ointment.  Watch for signs of concussion of the next 12 hours.

## 2019-02-09 NOTE — Progress Notes (Signed)
   Subjective:    Patient ID: James [name], male    DOB: [birth year], 64 y.o.   MRN: [id number]  HPI Patient was at work this afternoon.  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  He has a laceration to his scalp.  He did not lose consciousness.  He called the office.  We advised being seen in the emergency department but since he does not currently have health insurance he wanted to come here.    Review of Systems see above-has some mild headache but no nausea or vomiting.     Objective:   Physical Exam Blood pressure 110/70 pulse 60 pulse oximetry 98% on room air temperature 98.5 degrees BMI 27.44 he is alert and oriented x3.  PERRLA extraocular movements are full.  No facial weakness.  Ambulates without difficulty and moves all 4 extremities.  No dysarthria.  He remembers the accident and has no evidence of amnesia.  There is a 2.5 cm laceration just left of his mid scalp in the parietal area.  It was cleaned well with peroxide.  It was stripped with Steri-Strips.  Smaller laceration to the right of midline.  Bleeding was controlled with pressure     Assessment & Plan:  2 scalp laceration secondary to fall at his work  No evidence of concussion  Plan: Wife is to keep an eye on him for the next 12 hours and make sure he is not having signs of concussion such as memory loss nausea or vomiting.  He is to leave strips on until they fall off.  Clean lacerations with peroxide twice daily.  May apply Bactroban ointment.  Tetanus immunization update given.

## 2019-02-21 ENCOUNTER — Telehealth: Payer: Self-pay | Admitting: Internal Medicine

## 2019-02-21 NOTE — Telephone Encounter (Signed)
Pt states he has noticed some blood on the tissue when he wipes and a little in the toilet water the past couple of times he had a BM. He thinks it is hemorrhoid related but reports he has not had any constipation. Pt also wanted to let Dr. Norman Herrlich know that he has been able to get insurance and is now back to taking 4 lialda a day and canasa daily, he started this on Monday. Please advise.

## 2019-02-21 NOTE — Telephone Encounter (Signed)
Pt reported that he is experiencing blood in stool.  He suspects hemorrhoids.  Please advise.

## 2019-02-25 NOTE — Telephone Encounter (Signed)
Given that he has just recently started back on mesalamine both orally and per rectum I would give this 2 to 3 weeks for efficacy He should let me know if he continues to see bright red blood after being on therapy for 2 to 3 weeks

## 2019-02-25 NOTE — Telephone Encounter (Signed)
The patient has been notified of this information and all questions answered. The pt states is has not seen any bleeding at this time.  He will call back if he begins to see bleeding again.

## 2019-03-21 ENCOUNTER — Ambulatory Visit: Payer: Self-pay | Attending: Family

## 2019-03-21 DIAGNOSIS — Z23 Encounter for immunization: Secondary | ICD-10-CM | POA: Insufficient documentation

## 2019-03-21 NOTE — Progress Notes (Signed)
   Covid-19 Vaccination Clinic  Name:  James [name]    MRN: [id number] DOB: 1955-12-02  03/21/2019  [name] was observed post Covid-19 immunization for 15 minutes without incident. He was provided with Vaccine Information Sheet and instruction to access the V-Safe system.   [name] was instructed to call 911 with any severe reactions post vaccine: Marland Kitchen Difficulty breathing  . Swelling of face and throat  . A fast heartbeat  . A bad rash all over body  . Dizziness and weakness   Immunizations Administered    Name Date Dose VIS Date Route   Moderna COVID-19 Vaccine 03/21/2019  3:28 PM 0.5 mL 12/18/2018 Intramuscular   Manufacturer: Moderna   Lot: [id number]   [place]: [id number]

## 2019-03-26 ENCOUNTER — Other Ambulatory Visit (INDEPENDENT_AMBULATORY_CARE_PROVIDER_SITE_OTHER): Payer: BC Managed Care – PPO | Admitting: Internal Medicine

## 2019-03-26 ENCOUNTER — Other Ambulatory Visit: Payer: Self-pay

## 2019-03-26 DIAGNOSIS — E7800 Pure hypercholesterolemia, unspecified: Secondary | ICD-10-CM

## 2019-03-26 LAB — HEPATIC FUNCTION PANEL
AG Ratio: 1.8 (calc) (ref 1.0–2.5)
ALT: 16 U/L (ref 9–46)
AST: 23 U/L (ref 10–35)
Albumin: 4.5 g/dL (ref 3.6–5.1)
Alkaline phosphatase (APISO): 43 U/L (ref 35–144)
Bilirubin, Direct: 0.1 mg/dL (ref 0.0–0.2)
Globulin: 2.5 g/dL (calc) (ref 1.9–3.7)
Indirect Bilirubin: 0.5 mg/dL (calc) (ref 0.2–1.2)
Total Bilirubin: 0.6 mg/dL (ref 0.2–1.2)
Total Protein: 7 g/dL (ref 6.1–8.1)

## 2019-03-26 LAB — LIPID PANEL
Cholesterol: 185 mg/dL (ref <200)
HDL: 71 mg/dL (ref >=40)
LDL Cholesterol (Calc): 100 mg/dL (calc) — ABNORMAL HIGH
Non-HDL Cholesterol (Calc): 114 mg/dL (calc) (ref <130)
Total CHOL/HDL Ratio: 2.6 (calc) (ref <5.0)
Triglycerides: 62 mg/dL (ref <150)

## 2019-03-28 ENCOUNTER — Encounter: Payer: Self-pay | Admitting: Internal Medicine

## 2019-03-28 ENCOUNTER — Ambulatory Visit: Payer: BC Managed Care – PPO | Admitting: Internal Medicine

## 2019-03-28 ENCOUNTER — Other Ambulatory Visit: Payer: Self-pay

## 2019-03-28 VITALS — BP 130/90 | HR 67 | Temp 98.0°F | Ht 70.5 in | Wt 179.0 lb

## 2019-03-28 DIAGNOSIS — H35719 Central serous chorioretinopathy, unspecified eye: Secondary | ICD-10-CM | POA: Insufficient documentation

## 2019-03-28 DIAGNOSIS — H35371 Puckering of macula, right eye: Secondary | ICD-10-CM | POA: Insufficient documentation

## 2019-03-28 DIAGNOSIS — E7800 Pure hypercholesterolemia, unspecified: Secondary | ICD-10-CM

## 2019-03-28 DIAGNOSIS — F439 Reaction to severe stress, unspecified: Secondary | ICD-10-CM

## 2019-04-17 ENCOUNTER — Encounter: Payer: Self-pay | Admitting: Internal Medicine

## 2019-04-17 NOTE — Progress Notes (Signed)
   Subjective:    Patient ID: James [name], male    DOB: 1955/02/09, 64 y.o.   MRN: [id number]  HPI 64 year old Male seen today to follow-up on hyperlipidemia.  His wife has been diagnosed with metastatic lung cancer and it has been very stressful recently.  Says he is doing fairly well.  He has a history of Barrett's esophagus and ulcerative colitis followed by Dr. Hilarie Fredrickson.  He takes PPI for GE reflux.  Lipid panel is excellent.  LDL is 100.  Liver functions are normal.    Review of Systems no abdominal pain.  No chest pain.  Worried about wife which is understandable.     Objective:   Physical Exam  Blood pressure 130/90 pulse 67 temperature 98 degrees orally pulse oximetry 98% BMI 25.32 weight 179 pounds.  Neck supple.  Chest clear to auscultation.  Cardiac exam regular rate and rhythm normal S1 and S2 without murmurs or gallops.  No lower extremity edema.  Affect is within normal limits but understandably a bit sad.      Assessment & Plan:  Situational stress with wife who has metastatic lung cancer  Hyperlipidemia-stable on Crestor  History of ulcerative colitis and Barrett's esophagus followed by Dr. Hilarie Fredrickson  Plan: He has had 1 Moderna Covid-19 vaccine and has appointment for another one.  Tetanus immunization is up-to-date.  He will continue with Crestor for hyperlipidemia.  CPE booked for August 2021.

## 2019-04-17 NOTE — Patient Instructions (Signed)
Please let us know if we can do anything to assist you in this stressful time.  Continue with Crestor.  Your lipid values are excellent.

## 2019-04-17 NOTE — Telephone Encounter (Signed)
disregard

## 2019-04-20 ENCOUNTER — Other Ambulatory Visit: Payer: Self-pay | Admitting: Internal Medicine

## 2019-04-23 ENCOUNTER — Ambulatory Visit: Payer: Self-pay | Attending: Family

## 2019-04-23 DIAGNOSIS — Z23 Encounter for immunization: Secondary | ICD-10-CM

## 2019-04-23 NOTE — Progress Notes (Signed)
   Covid-19 Vaccination Clinic  Name:  James [name]    MRN: [id number] DOB: [birth year]  04/23/2019  [name] was observed post Covid-19 immunization for 15 minutes without incident. He was provided with Vaccine Information Sheet and instruction to access the V-Safe system.   [name] was instructed to call 911 with any severe reactions post vaccine: Marland Kitchen Difficulty breathing  . Swelling of face and throat  . A fast heartbeat  . A bad rash all over body  . Dizziness and weakness   Immunizations Administered    Name Date Dose VIS Date Route   Moderna COVID-19 Vaccine 04/23/2019  3:36 PM 0.5 mL 12/18/2018 Intramuscular   Manufacturer: Moderna   Lot: [id number]   [place]: 83507-573-22

## 2019-05-09 ENCOUNTER — Other Ambulatory Visit: Payer: Self-pay | Admitting: Internal Medicine

## 2019-05-17 ENCOUNTER — Other Ambulatory Visit: Payer: Self-pay | Admitting: Internal Medicine

## 2019-05-17 ENCOUNTER — Telehealth: Payer: Self-pay | Admitting: Internal Medicine

## 2019-05-17 DIAGNOSIS — K51511 Left sided colitis with rectal bleeding: Secondary | ICD-10-CM

## 2019-05-17 MED ORDER — LIALDA 1.2 G PO TBEC: 4.8000 g | DELAYED_RELEASE_TABLET | Freq: Every day | ORAL | 0 refills | Status: DC

## 2019-05-17 NOTE — Telephone Encounter (Signed)
Patient needs to refill mesalamine. Please send it to [facility] on [address]. Pt is requesting to have med rf today if possible because he will be going on Medicare tomorrow.

## 2019-05-17 NOTE — Telephone Encounter (Signed)
Rx sent 

## 2019-05-17 NOTE — Telephone Encounter (Signed)
Pt is scheduled for an OV 5/3 and requested a refill for mesalamine.

## 2019-05-20 ENCOUNTER — Encounter: Payer: Self-pay | Admitting: Internal Medicine

## 2019-05-20 ENCOUNTER — Ambulatory Visit: Payer: BC Managed Care – PPO | Admitting: Internal Medicine

## 2019-05-20 VITALS — BP 108/68 | HR 70 | Temp 97.9°F | Ht 70.0 in | Wt 180.0 lb

## 2019-05-20 DIAGNOSIS — Z86010 Personal history of colonic polyps: Secondary | ICD-10-CM | POA: Diagnosis not present

## 2019-05-20 DIAGNOSIS — K219 Gastro-esophageal reflux disease without esophagitis: Secondary | ICD-10-CM

## 2019-05-20 DIAGNOSIS — K2270 Barrett's esophagus without dysplasia: Secondary | ICD-10-CM

## 2019-05-20 DIAGNOSIS — K5150 Left sided colitis without complications: Secondary | ICD-10-CM | POA: Diagnosis not present

## 2019-05-20 MED ORDER — MESALAMINE 1000 MG RE SUPP: 1000.0000 mg | Freq: Every day | RECTAL | 3 refills | Status: DC

## 2019-05-20 MED ORDER — MESALAMINE 1.2 G PO TBEC: 4.8000 g | DELAYED_RELEASE_TABLET | Freq: Every day | ORAL | 3 refills | Status: DC

## 2019-05-20 NOTE — Progress Notes (Signed)
Subjective:    Patient ID: James [name], male    DOB: 22-Jul-1955, 64 y.o.   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  He is seen in person today and was last seen by virtual visit on 07/17/2018.  Unfortunately Randy's wife was diagnosed with stage IV non-small cell lung cancer in January.  She has been undergoing aggressive chemotherapy and radiation.  This is been quite stressful on he and his family.  From a GI perspective he feels that his colitis is under good control.  He is using Lialda 2.4 g daily and Canasa 1 g about every other day.  Bowel movements are 1 or 2/day but he can skip a day.  He is found the need to use a mild laxative otherwise he feels constipated.  He is using senna 2 tablets 3 or 4 days a week with good result.  No blood in stool or melena.  No abdominal pain.  Reflux is well controlled he is having occasional regurgitation 1 or 2 times per month but no heartburn.  No dysphagia or odynophagia.  He is using the pantoprazole 40 mg daily.  He has lost about 12 pounds having recently gained back 6 pounds which made his total weight loss about 18 pounds.  He attributes this to the stress after his wife's cancer diagnosis.  He will turn 86 later this month and have Medicare as of June 1   Review of Systems As per HPI, otherwise negative  Current Medications, Allergies, Past Medical History, Past Surgical History, Family History and Social History were reviewed in [employer] record.     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  1.  Left-sided ulcerative colitis --seemingly under good control clinically.  We will continue Lialda 2.4 g daily and Canasa 1 g nightly.  He can use Lialda 4.8 g daily if he feels this dose is needed. --Lialda 2.4 to 4.8 g daily --Canasa 1 g nightly (okay to use every other night)  2.  GERD with history of Barrett's esophagus --currently doing well without frequent heartburn and no dysphagia symptom.  On daily PPI.  Initially surveillance upper endoscopy was recommended this year, 3 years after her last upper endoscopy.  However given the frequent medical appointments needed for his wife as she is being treated for metastatic lung cancer, I feel that it is okay to delay his endoscopy 1 year.  We discussed this together and after this discussion he feels comfortable with this.  We also discussed how national guidelines for Barrett's suggest  surveillance every 3 to 5 years. --Continue pantoprazole 40 mg daily --Upper endoscopy May 2022 for surveillance  3.  History of sessile serrated and adenomatous colon polyps --surveillance colonoscopy recommended March 2023  Annual follow-up, sooner if needed

## 2019-05-20 NOTE — Patient Instructions (Signed)
If you are age 64 or older, your body mass index should be between 23-30. Your Body mass index is 25.83 kg/m. If this is out of the aforementioned range listed, please consider follow up with your Primary Care Provider.  If you are age 19 or younger, your body mass index should be between 19-25. Your Body mass index is 25.83 kg/m. If this is out of the aformentioned range listed, please consider follow up with your Primary Care Provider.   We have sent the following medications to your pharmacy for you to pick up at your convenience: Lialda 1.2g: Take 4 daily Canasa suppositories: Take daily at bedtime  Continue pantoprazole 40m daily.  You will be due for a recall EGD in 05-2020. We will send you a reminder in the mail when it gets closer to that time.  Please follow up in 1 year.  It was a pleasure to see you today!  Dr. PHilarie Fredrickson

## 2019-06-05 DIAGNOSIS — X32XXXA Exposure to sunlight, initial encounter: Secondary | ICD-10-CM | POA: Diagnosis not present

## 2019-06-05 DIAGNOSIS — L570 Actinic keratosis: Secondary | ICD-10-CM | POA: Diagnosis not present

## 2019-06-05 DIAGNOSIS — Z08 Encounter for follow-up examination after completed treatment for malignant neoplasm: Secondary | ICD-10-CM | POA: Diagnosis not present

## 2019-06-05 DIAGNOSIS — L258 Unspecified contact dermatitis due to other agents: Secondary | ICD-10-CM | POA: Diagnosis not present

## 2019-06-05 DIAGNOSIS — Z85828 Personal history of other malignant neoplasm of skin: Secondary | ICD-10-CM | POA: Diagnosis not present

## 2019-06-05 DIAGNOSIS — Z1283 Encounter for screening for malignant neoplasm of skin: Secondary | ICD-10-CM | POA: Diagnosis not present

## 2019-07-09 ENCOUNTER — Telehealth: Payer: Self-pay | Admitting: Internal Medicine

## 2019-07-09 NOTE — Telephone Encounter (Signed)
Cleatis Fandrich [phone]  James [name] for the last several months has been having pain in both wrist, thumbs and his hands, gradually getting worse, he would like to be seen, nothing urgent, he would like to also discuss prostate. Can wait till after the 4th.

## 2019-07-10 NOTE — Telephone Encounter (Signed)
He can come this week or next- needs morning appt as will need labs for joint pain

## 2019-07-10 NOTE — Telephone Encounter (Signed)
Appointment scheduled.

## 2019-07-12 ENCOUNTER — Ambulatory Visit (INDEPENDENT_AMBULATORY_CARE_PROVIDER_SITE_OTHER): Payer: Medicare Other | Admitting: Internal Medicine

## 2019-07-12 ENCOUNTER — Encounter: Payer: Self-pay | Admitting: Internal Medicine

## 2019-07-12 ENCOUNTER — Other Ambulatory Visit: Payer: Self-pay

## 2019-07-12 VITALS — BP 130/80 | HR 60 | Ht 70.5 in | Wt 178.0 lb

## 2019-07-12 DIAGNOSIS — K5901 Slow transit constipation: Secondary | ICD-10-CM

## 2019-07-12 DIAGNOSIS — K5150 Left sided colitis without complications: Secondary | ICD-10-CM | POA: Diagnosis not present

## 2019-07-12 DIAGNOSIS — M778 Other enthesopathies, not elsewhere classified: Secondary | ICD-10-CM

## 2019-07-12 DIAGNOSIS — R1031 Right lower quadrant pain: Secondary | ICD-10-CM | POA: Diagnosis not present

## 2019-07-12 MED ORDER — MELOXICAM 15 MG PO TABS: 15.0000 mg | ORAL_TABLET | Freq: Every day | ORAL | 0 refills | Status: DC

## 2019-07-12 NOTE — Patient Instructions (Addendum)
Take Mobic sparingly for right wrist pain.  See hand surgeon.  I do not think you have rheumatoid arthritis based on symptoms.  Suspect overuse syndrome/tendinitis.  Take MiraLAX for constipation.  No evidence of right inguinal hernia on exam

## 2019-07-12 NOTE — Progress Notes (Signed)
   Subjective:    Patient ID: James [name], male    DOB: [birth year], 64 y.o.   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 He is right handed.  No numbness or tingling in his hand.  Just right medial wrist pain.  Good range of motion with flexion and extension of wrist.  No known injury to the wrist.  No hot or inflamed joints right hand or wrist.    Hx left inguinal hernia repaired in his 20's.  Has noticed some discomfort in the right inguinal area with coughing.  Sometimes has slight penile discharge.  Currently not sexually active.  Wife has metastatic lung cancer.  Situational stress with wife's illness.  Some issues recently with constipation.  Took Senokot for several days and that did help.  Explained he would probably be better off with MiraLAX and Senokot.  Review of Systems Hx of inflammatory bowel disease.  Followed by gastroenterology.     Objective:   Physical Exam  Blood pressure 130/80 pulse 60 pulse oximetry 95% weight 178 pounds BMI 25.18.  Tender right medial breast to palpation.  No hyper inflamed joints in right hand or right wrist.  No hernia to direct palpation on the right    Assessment & Plan:  Patient is clearly concerned about his right wrist and will refer him to hand surgeon but my feeling is he probably has tendinitis.  I have prescribed Mobic 15 mg daily for short-term use.  May use ice on the right wrist area 20 minutes once or twice daily.  Constipation-prefer MiraLAX over Senokot  Inflammatory bowel disease-stable at present and followed by gastroenterology  Right inguinal pain-no hernia identified- slight puffiness in the right inguinal area  Situational stress with wife with metastatic lung cancer  Plan: He will see hand surgeon regarding his right wrist pain.  He is concerned about this  because he is right-handed and in the printing business.  I have started him on Mobic 15 mg daily.  He may use ice over right wrist.  Regarding constipation he will take MiraLAX.  No hernia identified  History of Barrett's esophagus without dysplasia treated with PPI  History of left-sided ulcerative colitis treated with Lialda and Canasa and followed by Dr. Hilarie Fredrickson

## 2019-07-23 ENCOUNTER — Other Ambulatory Visit: Payer: Self-pay

## 2019-07-23 ENCOUNTER — Telehealth: Payer: Self-pay | Admitting: Internal Medicine

## 2019-07-23 DIAGNOSIS — K5150 Left sided colitis without complications: Secondary | ICD-10-CM

## 2019-07-23 MED ORDER — CIPROFLOXACIN HCL 500 MG PO TABS: 500.0000 mg | ORAL_TABLET | Freq: Two times a day (BID) | ORAL | 0 refills | Status: AC

## 2019-07-23 MED ORDER — METRONIDAZOLE 250 MG PO TABS: 250.0000 mg | ORAL_TABLET | Freq: Three times a day (TID) | ORAL | 0 refills | Status: AC

## 2019-07-23 NOTE — Telephone Encounter (Signed)
Spoke with patient regarding recommendations, pt aware that he will need to come in for labs, pt states that he can come in tomorrow b/c his wife just got out of surgery today. Pt advised to continue Lialda at 4.8g daily, advised that we will send in Cipro and Metronidazole for 7 days. Pt advised to call us if symptoms do not improve or if symptoms worsen.  Lab order in epic. Prescriptions sent to preferred pharmacy.

## 2019-07-23 NOTE — Progress Notes (Signed)
CB

## 2019-07-23 NOTE — Telephone Encounter (Signed)
Pt states he started having pain behind his belly button more so in the LLQ than the RLQ. Pt states the pain started about 2-3 weeks ago. He has felt like he had a fever but when he checked he did not have one. His BM's are sporadic, he has between 5-8 per day. He was taking 2 Lialda a day until 2 weeks ago he increase it to 4/day. Pt wants to know what he can do for the discomfort until his scheduled appt. Please advise.

## 2019-07-23 NOTE — Telephone Encounter (Signed)
He does have a history of left-sided colitis but I am suspicious for diverticulitis based on his symptoms Agree with the increased dose of Lialda to 4.8 g daily Would have him come for CBC, CMP and CRP Would also start ciprofloxacin 500 mg twice daily and metronidazole 250 mg 3 times daily x7 days He should call back if this fails to improve his symptoms or if symptoms are worsening

## 2019-07-24 ENCOUNTER — Other Ambulatory Visit (INDEPENDENT_AMBULATORY_CARE_PROVIDER_SITE_OTHER): Payer: Medicare Other

## 2019-07-24 DIAGNOSIS — K5150 Left sided colitis without complications: Secondary | ICD-10-CM

## 2019-07-24 LAB — COMPREHENSIVE METABOLIC PANEL
ALT: 8 U/L (ref 0–53)
AST: 13 U/L (ref 0–37)
Albumin: 3.8 g/dL (ref 3.5–5.2)
Alkaline Phosphatase: 47 U/L (ref 39–117)
BUN: 13 mg/dL (ref 6–23)
CO2: 28 mEq/L (ref 19–32)
Calcium: 9.6 mg/dL (ref 8.4–10.5)
Chloride: 102 mEq/L (ref 96–112)
Creatinine, Ser: 0.94 mg/dL (ref 0.40–1.50)
GFR: 80.51 mL/min (ref >60.00)
Glucose, Bld: 92 mg/dL (ref 70–99)
Potassium: 3.6 mEq/L (ref 3.5–5.1)
Sodium: 139 mEq/L (ref 135–145)
Total Bilirubin: 0.4 mg/dL (ref 0.2–1.2)
Total Protein: 7.3 g/dL (ref 6.0–8.3)

## 2019-07-24 LAB — CBC
HCT: 36.2 % — ABNORMAL LOW (ref 39.0–52.0)
Hemoglobin: 12.1 g/dL — ABNORMAL LOW (ref 13.0–17.0)
MCHC: 33.5 g/dL (ref 30.0–36.0)
MCV: 91.6 fl (ref 78.0–100.0)
Platelets: 401 10*3/uL — ABNORMAL HIGH (ref 150.0–400.0)
RBC: 3.95 Mil/uL — ABNORMAL LOW (ref 4.22–5.81)
RDW: 12.4 % (ref 11.5–15.5)
WBC: 12.3 10*3/uL — ABNORMAL HIGH (ref 4.0–10.5)

## 2019-07-24 LAB — C-REACTIVE PROTEIN: CRP: 10.3 mg/dL (ref 0.5–20.0)

## 2019-07-30 ENCOUNTER — Other Ambulatory Visit: Payer: BC Managed Care – PPO

## 2019-07-30 ENCOUNTER — Telehealth: Payer: Self-pay | Admitting: Internal Medicine

## 2019-07-30 ENCOUNTER — Other Ambulatory Visit: Payer: Self-pay

## 2019-07-30 DIAGNOSIS — R197 Diarrhea, unspecified: Secondary | ICD-10-CM

## 2019-07-30 NOTE — Telephone Encounter (Signed)
Have him do a C diff PCR and fecal calprotectin Thanks JMP

## 2019-07-30 NOTE — Telephone Encounter (Signed)
Left message for pt to call back.   Pt states that he is much improved, will finish antibiotic this evening. He is taking Lialda 4.8 daily but states he is still having 7-10 stools a day and has not had a solid stool in over a week. Pt wants to know if Dr. Hilarie Fredrickson just feels it will take more time for him to improve or if there is anything else he can recommend. Please advise.

## 2019-07-30 NOTE — Telephone Encounter (Signed)
Spoke with pt and he is aware, orders in epic.

## 2019-07-31 ENCOUNTER — Other Ambulatory Visit: Payer: BC Managed Care – PPO

## 2019-07-31 DIAGNOSIS — R197 Diarrhea, unspecified: Secondary | ICD-10-CM

## 2019-08-02 ENCOUNTER — Telehealth: Payer: Self-pay | Admitting: Internal Medicine

## 2019-08-02 LAB — CALPROTECTIN, FECAL: Calprotectin, Fecal: 1680 ug/g — ABNORMAL HIGH (ref 0–120)

## 2019-08-02 LAB — CLOSTRIDIUM DIFFICILE BY PCR: Toxigenic C. Difficile by PCR: NEGATIVE

## 2019-08-02 NOTE — Telephone Encounter (Signed)
Pt sent pt advice request.

## 2019-08-05 ENCOUNTER — Telehealth: Payer: Self-pay | Admitting: Internal Medicine

## 2019-08-05 NOTE — Telephone Encounter (Signed)
Called and let James [name] know that a referral had been place to [facility], he verbalized understanding and will call them if he has not heard from them in a few days.

## 2019-08-05 NOTE — Telephone Encounter (Signed)
Please refer him to [facility] to check for hernia. Use note from June.

## 2019-08-05 NOTE — Telephone Encounter (Signed)
James [name] [phone]  [name] called to say he is still having R side uncomfortableness, he sneezed the other day and it has seemed to have gotten worse with burning sensation, and bulges some and it hurts.

## 2019-08-15 ENCOUNTER — Other Ambulatory Visit (INDEPENDENT_AMBULATORY_CARE_PROVIDER_SITE_OTHER): Payer: Medicare Other | Admitting: Internal Medicine

## 2019-08-15 ENCOUNTER — Other Ambulatory Visit: Payer: Self-pay

## 2019-08-15 DIAGNOSIS — K22719 Barrett's esophagus with dysplasia, unspecified: Secondary | ICD-10-CM

## 2019-08-15 DIAGNOSIS — K51318 Ulcerative (chronic) rectosigmoiditis with other complication: Secondary | ICD-10-CM

## 2019-08-15 DIAGNOSIS — Z87891 Personal history of nicotine dependence: Secondary | ICD-10-CM

## 2019-08-15 DIAGNOSIS — H35719 Central serous chorioretinopathy, unspecified eye: Secondary | ICD-10-CM

## 2019-08-15 DIAGNOSIS — G4733 Obstructive sleep apnea (adult) (pediatric): Secondary | ICD-10-CM

## 2019-08-15 DIAGNOSIS — Z0000 Encounter for general adult medical examination without abnormal findings: Secondary | ICD-10-CM

## 2019-08-15 DIAGNOSIS — K219 Gastro-esophageal reflux disease without esophagitis: Secondary | ICD-10-CM

## 2019-08-15 DIAGNOSIS — E7800 Pure hypercholesterolemia, unspecified: Secondary | ICD-10-CM

## 2019-08-15 DIAGNOSIS — Z125 Encounter for screening for malignant neoplasm of prostate: Secondary | ICD-10-CM

## 2019-08-15 DIAGNOSIS — M1611 Unilateral primary osteoarthritis, right hip: Secondary | ICD-10-CM

## 2019-08-19 ENCOUNTER — Ambulatory Visit (INDEPENDENT_AMBULATORY_CARE_PROVIDER_SITE_OTHER): Payer: Medicare Other | Admitting: Internal Medicine

## 2019-08-19 ENCOUNTER — Other Ambulatory Visit: Payer: Self-pay

## 2019-08-19 ENCOUNTER — Encounter: Payer: Self-pay | Admitting: Internal Medicine

## 2019-08-19 VITALS — BP 110/80 | HR 74 | Ht 70.5 in | Wt 178.0 lb

## 2019-08-19 DIAGNOSIS — R972 Elevated prostate specific antigen [PSA]: Secondary | ICD-10-CM

## 2019-08-19 DIAGNOSIS — F411 Generalized anxiety disorder: Secondary | ICD-10-CM

## 2019-08-19 DIAGNOSIS — Z0000 Encounter for general adult medical examination without abnormal findings: Secondary | ICD-10-CM | POA: Diagnosis not present

## 2019-08-19 DIAGNOSIS — D649 Anemia, unspecified: Secondary | ICD-10-CM | POA: Diagnosis not present

## 2019-08-19 DIAGNOSIS — G4733 Obstructive sleep apnea (adult) (pediatric): Secondary | ICD-10-CM

## 2019-08-19 DIAGNOSIS — F439 Reaction to severe stress, unspecified: Secondary | ICD-10-CM

## 2019-08-19 DIAGNOSIS — K51318 Ulcerative (chronic) rectosigmoiditis with other complication: Secondary | ICD-10-CM

## 2019-08-19 DIAGNOSIS — Z23 Encounter for immunization: Secondary | ICD-10-CM

## 2019-08-19 LAB — POCT URINALYSIS DIPSTICK
Appearance: NEGATIVE
Bilirubin, UA: NEGATIVE
Blood, UA: NEGATIVE
Glucose, UA: NEGATIVE
Ketones, UA: NEGATIVE
Leukocytes, UA: NEGATIVE
Nitrite, UA: NEGATIVE
Odor: NEGATIVE
Protein, UA: NEGATIVE
Spec Grav, UA: 1.01 (ref 1.010–1.025)
Urobilinogen, UA: 0.2 E.U./dL
pH, UA: 6.5 (ref 5.0–8.0)

## 2019-08-20 LAB — CBC WITH DIFFERENTIAL/PLATELET
Absolute Monocytes: 935 cells/uL (ref 200–950)
Basophils Absolute: 51 cells/uL (ref 0–200)
Basophils Relative: 0.6 %
Eosinophils Absolute: 1377 cells/uL — ABNORMAL HIGH (ref 15–500)
Eosinophils Relative: 16.2 %
HCT: 34 % — ABNORMAL LOW (ref 38.5–50.0)
Hemoglobin: 11.1 g/dL — ABNORMAL LOW (ref 13.2–17.1)
Lymphs Abs: 1845 cells/uL (ref 850–3900)
MCH: 30.4 pg (ref 27.0–33.0)
MCHC: 32.6 g/dL (ref 32.0–36.0)
MCV: 93.2 fL (ref 80.0–100.0)
MPV: 12.2 fL (ref 7.5–12.5)
Monocytes Relative: 11 %
Neutro Abs: 4293 cells/uL (ref 1500–7800)
Neutrophils Relative %: 50.5 %
Platelets: 240 10*3/uL (ref 140–400)
RBC: 3.65 10*6/uL — ABNORMAL LOW (ref 4.20–5.80)
RDW: 11.7 % (ref 11.0–15.0)
Total Lymphocyte: 21.7 %
WBC: 8.5 10*3/uL (ref 3.8–10.8)

## 2019-08-20 LAB — COMPLETE METABOLIC PANEL WITH GFR
AG Ratio: 1.1 (calc) (ref 1.0–2.5)
ALT: 10 U/L (ref 9–46)
AST: 16 U/L (ref 10–35)
Albumin: 3.3 g/dL — ABNORMAL LOW (ref 3.6–5.1)
Alkaline phosphatase (APISO): 46 U/L (ref 35–144)
BUN: 10 mg/dL (ref 7–25)
CO2: 27 mmol/L (ref 20–32)
Calcium: 9.3 mg/dL (ref 8.6–10.3)
Chloride: 106 mmol/L (ref 98–110)
Creat: 0.85 mg/dL (ref 0.70–1.25)
GFR, Est African American: 106 mL/min/{1.73_m2} (ref >=60)
GFR, Est Non African American: 91 mL/min/{1.73_m2} (ref >=60)
Globulin: 3 g/dL (calc) (ref 1.9–3.7)
Glucose, Bld: 91 mg/dL (ref 65–99)
Potassium: 4.5 mmol/L (ref 3.5–5.3)
Sodium: 142 mmol/L (ref 135–146)
Total Bilirubin: 0.4 mg/dL (ref 0.2–1.2)
Total Protein: 6.3 g/dL (ref 6.1–8.1)

## 2019-08-20 LAB — TEST AUTHORIZATION

## 2019-08-20 LAB — LIPID PANEL
Cholesterol: 142 mg/dL (ref <200)
HDL: 60 mg/dL (ref >=40)
LDL Cholesterol (Calc): 69 mg/dL (calc)
Non-HDL Cholesterol (Calc): 82 mg/dL (calc) (ref <130)
Total CHOL/HDL Ratio: 2.4 (calc) (ref <5.0)
Triglycerides: 53 mg/dL (ref <150)

## 2019-08-20 LAB — FOLATE: Folate: 5.9 ng/mL

## 2019-08-20 LAB — FERRITIN: Ferritin: 89 ng/mL (ref 24–380)

## 2019-08-20 LAB — TRANSFERRIN: Transferrin: 174 mg/dL — ABNORMAL LOW (ref 188–341)

## 2019-08-20 LAB — PSA: PSA: 5 ng/mL — ABNORMAL HIGH (ref <=4.0)

## 2019-08-20 LAB — VITAMIN B12: Vitamin B-12: 1216 pg/mL — ABNORMAL HIGH (ref 200–1100)

## 2019-08-20 NOTE — Telephone Encounter (Signed)
Called over to [facility] and Dr Autumn Messing is who patient is seeing and they have out him on wait list. They only have certain doctors that see patients for hernias. This was first available.

## 2019-09-01 ENCOUNTER — Encounter: Payer: Self-pay | Admitting: Internal Medicine

## 2019-09-01 MED ORDER — ALPRAZOLAM 0.25 MG PO TABS
0.2500 mg | ORAL_TABLET | Freq: Two times a day (BID) | ORAL | 0 refills | Status: DC | PRN
Start: 2019-09-01 — End: 2020-06-22

## 2019-09-01 NOTE — Progress Notes (Signed)
Subjective:    Patient ID: James [name], male    DOB: 02-12-1955, 64 y.o.   MRN: [id number]  HPI 64 year old Male here today for welcome to Medicare physical exam and evaluation of medical issues.  Currently his wife is being treated for metastatic lung cancer.  He has considerable situational stress and is requesting Xanax for anxiety which I feel is appropriate at this time.  He will take this sparingly.  His labs are reviewed and he has an elevated PSA of 5.  In July 2020 it was 1.1.  Examination of his prostate reveals symmetrical enlargement and maybe slight bogginess.  I do not feel a distinct nodule but I am referring him to urology for evaluation.  He has a history of inflammatory bowel disease (left-sided ulcerative colitis) diagnosed in 2018 and followed by gastroenterologist.  He also has a history of Barrett's esophagus without dysplasia diagnosed in 2018 and GE reflux.  History of sessile serrated adenomatous colon polyps.  Hemoglobin is 11.1 g and was 14.3 g a year ago.  His glucose was normal.  BUN and creatinine are normal.  His albumin is low at 3.3.  Liver functions are normal.  Due to his anemia B12 and folate levels were obtained and are normal.  His ferritin was 89 but it may be an acute phase reactant due to inflammatory bowel disease.  Iron and iron binding capacity unfortunately were not obtained due to some reagent shortage with [facility].  He is to take iron sulfate twice daily for 30 days and follow-up here in 4 weeks with CBC and office visit.  His last colonoscopy was a flex sig in November 2018 and prior to that had full colonoscopy in May 2018.  For inflammatory bowel disease he is on both [place].  History of hyperlipidemia treated with low-dose Crestor.  History of osteoarthritis of right hip and is status post right hip arthroplasty by Dr. Ninfa Linden in February 2016.  He had left medial meniscectomy in 2019.  He cannot have steroid injections due to  optical issues with his eyes.  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 Tressia Danas at [facility] in [place].  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  History of snoring and sleep apnea and has CPAP device.  He has had issues with his right scapula in the past but has not complained of that recently.  He had an MRI which did not show an abnormality but has snapping of his scapula with certain motions.  Fractured left forearm 1962, fractured right ankle 1971, left inguinal herniorrhaphy 1995.  History of internal hemorrhoids.  No known drug allergies.  Has had 2 COVID-19 vaccines.  Tetanus immunization is up-to-date.  Had Zostavax vaccine in 2016.  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  He saw Dr. 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  Social history: He is married.  Completed 2 years of college.  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  They live near [place].  Family history: Father died at age 62 with history of coronary artery disease, esophageal and colon cancer.  Mother with history of stroke and colon cancer.  Mother is deceased.  2 sisters in good health.      Review of Systems long discussion with him today regarding wife's health.  He  is tearful in the office today and is sad.  I am very concerned about his wife's metastatic disease at this point.  This was communicated with him today.  Wife is also a patient here.     Objective:   Physical Exam Blood pressure 110/80 pulse 74 pulse oximetry 96% weight 178 pounds height 5 feet 10.5 inches BMI 25.18  Skin warm and dry.  No cervical adenopathy.  Neck is supple without carotid bruits.  No thyromegaly.  Chest clear to auscultation.  Cardiac exam regular rate and rhythm normal S1 and S2 without murmurs or gallops.  Abdomen soft nondistended without hepatosplenomegaly masses  or tenderness.  Prostate is slightly boggy.  No distinct nodules are palpated.  No lower extremity edema.  Neuro intact without focal deficits.  He is tearful in the office today discussing wife's illness.       Assessment & Plan:  Elevated PSA-referring to urology for evaluation  Inflammatory bowel disease followed by Dr. Hilarie Fredrickson  Situational stress and anxiety state with wife's illness and have prescribed Xanax for anxiety  History of sleep apnea-has CPAP device  Status post right hip arthroplasty in stable  GE reflux treated with PPI  Pure hypercholesterolemia taking Crestor 5 mg daily with normal lipid panel  Normocytic anemia with hemoglobin 11.1 g.  Could be due to inflammatory bowel disease.  Has not been eating as well recently.  We will add iron studies and follow-up in 4 weeks.  Last colonoscopy by gastroenterologist was 2018.  Plan: Return in 4 weeks after trial of iron therapy.  He will need CBC and ferritin at that time.  Urology consultation for elevated PSA.  Prevnar 13 given today.  Take Xanax sparingly for anxiety.  Subjective:   Patient presents for Medicare Annual/Subsequent preventive examination.  Review Past Medical/Family/Social: See above   Risk Factors  Current exercise habits: Active about his work Dietary issues discussed: Low-fat low carbohydrate  Cardiac risk factors: Hyperlipidemia  Depression Screen  (Note: if answer to either of the following is "Yes", a more complete depression screening is indicated)   Over the past two weeks, have you felt down, depressed or hopeless? No  Over the past two weeks, have you felt little interest or pleasure in doing things? No Have you lost interest or pleasure in daily life? No Do you often feel hopeless? No Do you cry easily over simple problems? No   Activities of Daily Living  In your present state of health, do you have any difficulty performing the following activities?:   Driving? No  Managing  money? No  Feeding yourself? No  Getting from bed to chair? No  Climbing a flight of stairs? No  Preparing food and eating?: No  Bathing or showering? No  Getting dressed: No  Getting to the toilet? No  Using the toilet:No  Moving around from place to place: No  In the past year have you fallen or had a near fall?:  Yes-1 with only scalp laceration Are you sexually active?  Yes Do you have more than one partner? No   Hearing Difficulties:  Do you often ask people to speak up or repeat themselves?  Yes Do you experience ringing or noises in your ears?  Yes Do you have difficulty understanding soft or whispered voices?  Sometimes Do you feel that you have a problem with memory? No Do you often misplace items? No    Home Safety:  Do you have a smoke alarm at your residence?  Yes Do you have grab bars in the bathroom?  No Do you have throw rugs in your house?  Yes   Cognitive Testing  Alert? Yes Normal Appearance?Yes  Oriented to person? Yes Place? Yes  Time? Yes  Recall of three objects? Yes  Can perform simple calculations? Yes  Displays appropriate judgment?Yes  Can read the correct time from a watch face?Yes   List the Names of Other Physician/Practitioners you currently use:  See referral list for the physicians patient is currently seeing.  Orthopedist  Gastroenterologist   Review of Systems: See above  Objective:     General appearance: Appears younger than stated age Head: Normocephalic, without obvious abnormality, atraumatic  Eyes: conj clear, EOMi PEERLA  Ears: normal TM's and external ear canals both ears  Nose: Nares normal. Septum midline. Mucosa normal. No drainage or sinus tenderness.  Throat: lips, mucosa, and tongue normal; teeth and gums normal  Neck: no adenopathy, no carotid bruit, no JVD, supple, symmetrical, trachea midline and thyroid not enlarged, symmetric, no tenderness/mass/nodules  No CVA tenderness.  Lungs: clear to auscultation  bilaterally  Breasts: normal appearance, no masses or tenderness Heart: regular rate and rhythm, S1, S2 normal, no murmur, click, rub or gallop  Abdomen: soft, non-tender; bowel sounds normal; no masses, no organomegaly  Musculoskeletal: ROM normal in all joints, no crepitus, no deformity, Normal muscle strengthen. Back  is symmetric, no curvature. Skin: Skin color, texture, turgor normal. No rashes or lesions  Lymph nodes: Cervical, supraclavicular, and axillary nodes normal.  Neurologic: CN 2 -12 Normal, Normal symmetric reflexes. Normal coordination and gait  Psych: Alert & Oriented x 3, Mood appear stable.    Assessment:    Annual wellness medicare exam   Plan:    During the course of the visit the patient was educated and counseled about appropriate screening and preventive services including:   Prevnar 13 given today.  Had Tdap in January 2021.  Has had 2 COVID-19 vaccines in March and April of this year.     Patient Instructions (the written plan) was given to the patient.  Medicare Attestation  I have personally reviewed:  The patient's medical and social history  Their use of alcohol, tobacco or illicit drugs  Their current medications and supplements  The patient's functional ability including ADLs,fall risks, home safety risks, cognitive, and hearing and visual impairment  Diet and physical activities  Evidence for depression or mood disorders  The patient's weight, height, BMI, and visual acuity have been recorded in the chart. I have made referrals, counseling, and provided education to the patient based on review of the above and I have provided the patient with a written personalized care plan for preventive services.

## 2019-09-01 NOTE — Patient Instructions (Addendum)
Please take iron supplement and follow-up here in 4 weeks.  We are referring you to Urology due to elevated PSA.  Take Xanax sparingly for anxiety.

## 2019-09-02 ENCOUNTER — Encounter: Payer: Self-pay | Admitting: Internal Medicine

## 2019-09-12 ENCOUNTER — Other Ambulatory Visit: Payer: Self-pay | Admitting: General Surgery

## 2019-09-12 DIAGNOSIS — R1031 Right lower quadrant pain: Secondary | ICD-10-CM

## 2019-09-15 ENCOUNTER — Other Ambulatory Visit: Payer: Self-pay | Admitting: Internal Medicine

## 2019-09-17 ENCOUNTER — Ambulatory Visit
Admission: RE | Admit: 2019-09-17 | Discharge: 2019-09-17 | Disposition: A | Discharge status: Home / Self Care | Payer: Medicare Other | Source: Ambulatory Visit | Attending: General Surgery | Admitting: General Surgery

## 2019-09-17 ENCOUNTER — Other Ambulatory Visit: Payer: Self-pay

## 2019-09-17 ENCOUNTER — Other Ambulatory Visit: Payer: BC Managed Care – PPO

## 2019-09-17 DIAGNOSIS — R1031 Right lower quadrant pain: Secondary | ICD-10-CM

## 2019-09-18 ENCOUNTER — Telehealth: Payer: Self-pay | Admitting: Internal Medicine

## 2019-09-18 NOTE — Telephone Encounter (Signed)
Pt thought he had another inguinal hernia due to the burning he is having in his right lower pelvic area. He had a CT scan done yesterday that was ordered by Dr. Marlou Starks. Pt states his eye doctor did not want him to take uceris or other steroids but pt states if Dr. Hilarie Fredrickson thinks that will help his symptoms he would like to take the meds. Please see below and advise.  IMPRESSION: 1. Wall thickening inflammatory changes are seen involving the visualized portions of the distal descending colon concerning for infectious or inflammatory colitis. 2. Moderate prostatic enlargement. 3. No definite hernia is noted. 4. Aortic atherosclerosis.

## 2019-09-19 ENCOUNTER — Telehealth: Payer: Self-pay | Admitting: Internal Medicine

## 2019-09-19 ENCOUNTER — Other Ambulatory Visit: Payer: Self-pay

## 2019-09-19 DIAGNOSIS — K5150 Left sided colitis without complications: Secondary | ICD-10-CM

## 2019-09-19 NOTE — Telephone Encounter (Signed)
Pt already has OV scheduled 09/27/19@1 :30pm, Pt will get instruction for colon at visit. Colon scheduled in the [facility] 09/30/19@3 :30pm, amb referral in epic. Pt aware of appts.

## 2019-09-19 NOTE — Telephone Encounter (Signed)
Pt called again, the ntibiotics prescribed by Dr. Marlou Starks are Cipro 580m 1 BID for 7 days, and Flagyl 500 mg 1 TID for 7 days.

## 2019-09-19 NOTE — Telephone Encounter (Signed)
Patient called states he was given two antibiotic medications and would like to know if that is ok with Dr. Hilarie Fredrickson.

## 2019-09-19 NOTE — Telephone Encounter (Signed)
Pt states Dr. Marlou Starks wanted to start him to 2 different antibiotics and he wants to know if Dr. Hilarie Fredrickson is ok with him taking these. Pt did not know names of meds, he will call [facility] and let us know the names.  Pt called back and the meds are:  Cipro 579m 1 BID for 7 days, and Flagyl 500 mg 1 TID   Pt wants to make sure Dr. PHilarie Fredricksonis ok with him taking these antibiotics. Please advise.

## 2019-09-19 NOTE — Telephone Encounter (Signed)
I think we may have to escalate therapy to include a biologic (Remicade, Humira or Entyvio) given that he is on max dose mesalamine PO and PR Given significant eye disease and the risk of blindness associated with worsening eye disease especially if steroids are used, I would like to avoid even budesonide. I think it is best to repeat colonoscopy now before escalation of therapy If he is agreeable this can be scheduled via previsit without office visit 1st

## 2019-09-20 NOTE — Telephone Encounter (Signed)
Ok to take the abx as prescribed which will ensure no component of diverticulitis (as he has diverticulosis in the segment of colitis)

## 2019-09-20 NOTE — Telephone Encounter (Signed)
Pt is requesting a call back from [place].

## 2019-09-20 NOTE — Telephone Encounter (Signed)
The pt has been advised and verbalized understanding.

## 2019-09-20 NOTE — Telephone Encounter (Signed)
Left message on machine to call back  

## 2019-09-25 ENCOUNTER — Telehealth: Payer: Self-pay | Admitting: Internal Medicine

## 2019-09-26 ENCOUNTER — Other Ambulatory Visit: Payer: Medicare Other | Admitting: Internal Medicine

## 2019-09-26 ENCOUNTER — Other Ambulatory Visit: Payer: Self-pay

## 2019-09-26 DIAGNOSIS — D649 Anemia, unspecified: Secondary | ICD-10-CM

## 2019-09-26 LAB — CBC WITH DIFFERENTIAL/PLATELET
Absolute Monocytes: 968 cells/uL — ABNORMAL HIGH (ref 200–950)
Basophils Absolute: 49 cells/uL (ref 0–200)
Basophils Relative: 0.6 %
Eosinophils Absolute: 1091 cells/uL — ABNORMAL HIGH (ref 15–500)
Eosinophils Relative: 13.3 %
HCT: 36.3 % — ABNORMAL LOW (ref 38.5–50.0)
Hemoglobin: 11.7 g/dL — ABNORMAL LOW (ref 13.2–17.1)
Lymphs Abs: 1747 cells/uL (ref 850–3900)
MCH: 30.3 pg (ref 27.0–33.0)
MCHC: 32.2 g/dL (ref 32.0–36.0)
MCV: 94 fL (ref 80.0–100.0)
MPV: 11.2 fL (ref 7.5–12.5)
Monocytes Relative: 11.8 %
Neutro Abs: 4346 cells/uL (ref 1500–7800)
Neutrophils Relative %: 53 %
Platelets: 303 10*3/uL (ref 140–400)
RBC: 3.86 10*6/uL — ABNORMAL LOW (ref 4.20–5.80)
RDW: 12.9 % (ref 11.0–15.0)
Total Lymphocyte: 21.3 %
WBC: 8.2 10*3/uL (ref 3.8–10.8)

## 2019-09-26 NOTE — Telephone Encounter (Signed)
We will go over any needed prep day of office appointment.

## 2019-09-27 ENCOUNTER — Encounter: Payer: Self-pay | Admitting: Internal Medicine

## 2019-09-27 ENCOUNTER — Telehealth: Payer: Self-pay

## 2019-09-27 ENCOUNTER — Ambulatory Visit (INDEPENDENT_AMBULATORY_CARE_PROVIDER_SITE_OTHER): Payer: Medicare Other | Admitting: Internal Medicine

## 2019-09-27 ENCOUNTER — Other Ambulatory Visit: Payer: Medicare Other | Admitting: Internal Medicine

## 2019-09-27 VITALS — BP 110/72 | HR 82 | Ht 71.0 in | Wt 183.6 lb

## 2019-09-27 DIAGNOSIS — K51511 Left sided colitis with rectal bleeding: Secondary | ICD-10-CM

## 2019-09-27 DIAGNOSIS — Z860100 Personal history of colon polyps, unspecified: Secondary | ICD-10-CM

## 2019-09-27 DIAGNOSIS — R1031 Right lower quadrant pain: Secondary | ICD-10-CM | POA: Diagnosis not present

## 2019-09-27 MED ORDER — SUTAB 1479-225-188 MG PO TABS: 1.0000 | ORAL_TABLET | ORAL | 0 refills | Status: DC

## 2019-09-27 NOTE — Patient Instructions (Addendum)
You have been scheduled for a colonoscopy. Please follow written instructions given to you at your visit today.  Please pick up your prep supplies at the pharmacy within the next 1-3 days. If you use inhalers (even only as needed), please bring them with you on the day of your procedure.  Continue Lialda 4.8 grams daily.  Continue canasa every night.  Continue pantoprazole 40 mg daily.  Complete antibiotics today.  If you are age 65 or older, your body mass index should be between 23-30. Your Body mass index is 25.61 kg/m. If this is out of the aforementioned range listed, please consider follow up with your Primary Care Provider.  If you are age 70 or younger, your body mass index should be between 19-25. Your Body mass index is 25.61 kg/m. If this is out of the aformentioned range listed, please consider follow up with your Primary Care Provider.   Due to recent changes in healthcare laws, you may see the results of your imaging and laboratory studies on MyChart before your provider has had a chance to review them.  We understand that in some cases there may be results that are confusing or concerning to you. Not all laboratory results come back in the same time frame and the provider may be waiting for multiple results in order to interpret others.  Please give [place] 48 hours in order for your provider to thoroughly review all the results before contacting the office for clarification of your results.

## 2019-09-27 NOTE — Progress Notes (Signed)
Subjective:    Patient ID: James [name], male    DOB: [birth year], 64 y.o.   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  I last saw him on 05/20/2019.  At the time of his last office visit he was doing well from a colitis standpoint but over the last several months he has developed recurrent lower both right and left, abdominal pain, urgent loose stools intermittently with blood as well as gas and bloating.  Some months ago he was feeling constipated and had been using senna and with this some of his colitis symptoms seem to worsen.  He since stopped senna.  He was using MiraLAX on occasion but not recently.  We had decreased his Lialda from 4.8 to 2.4 g daily as well as change Canasa to every other night.  Once colitis symptoms recurred we increased back to 4.8 g of Lialda daily and Canasa 1 g nightly.  He has noticed significant improvement in the abdominal pain but he is still having some slightly urgent looser stool in the morning.  Stools are occurring 2 sometimes 3 times a day.  He seen some scant blood but overall the blood in stool has improved.  Separate from his abdominal pain he is having some right groin pain which feels very similar to prior inguinal hernia pain.  He had inguinal hernia surgery on the left in the 1980s and the pain in the right inguinal area is reminiscent.  It is painful when he coughs, sneezes or blows his nose.  He feels the need to press or splint this area and it sometimes he will have to sit to improve the discomfort.  It can radiate into his right upper thigh.  He also saw Dr. Marlou Starks for possible right inguinal hernia and a wound up having a CT pelvis which did not  show definitive hernia but did show colitis, see below.  Dr. Marlou Starks started him on Cipro and Flagyl which he will complete today, which is 7 days total.  He feels that the antibiotics may have helped some of his colitis symptoms.   Review of Systems As per HPI, otherwise negative  Current Medications, Allergies, Past Medical History, Past Surgical History, Family History and Social History were reviewed in [employer] record.     Objective:   Physical Exam BP 110/72   Pulse 82   Ht 5' 11"  (1.803 m)   Wt 183 lb 9.6 oz (83.3 kg)   SpO2 98%   BMI 25.61 kg/m  Gen: awake, alert, NAD HEENT: anicteric CV: RRR, no mrg Pulm: CTA b/l Abd: soft, NT/ND, +BS throughout Ext: no c/c/e Neuro: nonfocal  CT PELVIS WITHOUT CONTRAST   TECHNIQUE: Multidetector CT imaging of the pelvis was performed following the standard protocol without intravenous contrast.   COMPARISON:  None.   FINDINGS: Urinary Tract:  No abnormality visualized.   Bowel: Sigmoid diverticulosis is noted. There does appear to be severe wall thickening and inflammatory changes involving the visualized portion of the distal descending colon concerning for infectious or inflammatory colitis.   Vascular/Lymphatic: Atherosclerosis of abdominal aorta is noted. No significant adenopathy is noted.   Reproductive:  Moderate prostatic enlargement is noted.   Other:  No definite hernia or ascites is noted.   Musculoskeletal: Status post right total hip arthroplasty. No acute osseous abnormality is noted.   IMPRESSION: 1. Wall thickening inflammatory changes are seen involving the visualized portions of the distal descending colon concerning for infectious or inflammatory colitis. 2. Moderate prostatic enlargement. 3. No definite hernia is noted. 4. Aortic atherosclerosis.   Aortic Atherosclerosis (ICD10-I70.0).     Electronically Signed   By: Marijo Conception M.D.   On: 09/17/2019  16:40  CBC    Component Value Date/Time   WBC 8.2 09/26/2019 1133   RBC 3.86 (L) 09/26/2019 1133   HGB 11.7 (L) 09/26/2019 1133   HCT 36.3 (L) 09/26/2019 1133   PLT 303 09/26/2019 1133   MCV 94.0 09/26/2019 1133   MCH 30.3 09/26/2019 1133   MCHC 32.2 09/26/2019 1133   RDW 12.9 09/26/2019 1133   LYMPHSABS 1,747 09/26/2019 1133   MONOABS 0.6 09/28/2016 1600   EOSABS 1,091 (H) 09/26/2019 1133   BASOSABS 49 09/26/2019 1133    CMP     Component Value Date/Time   NA 142 08/15/2019 1056   K 4.5 08/15/2019 1056   CL 106 08/15/2019 1056   CO2 27 08/15/2019 1056   GLUCOSE 91 08/15/2019 1056   BUN 10 08/15/2019 1056   CREATININE 0.85 08/15/2019 1056   CALCIUM 9.3 08/15/2019 1056   PROT 6.3 08/15/2019 1056   ALBUMIN 3.8 07/24/2019 1238   AST 16 08/15/2019 1056   ALT 10 08/15/2019 1056   ALKPHOS 47 07/24/2019 1238   BILITOT 0.4 08/15/2019 1056   GFRNONAA 91 08/15/2019 1056   GFRAA 106 08/15/2019 1056   Lab Results  Component Value Date   VITAMINB12 1,216 (H) 08/15/2019         Assessment & Plan:  64 year old male with history of left-sided ulcerative colitis diagnosed in 2018, GERD with history of Barrett's esophagus without dysplasia also diagnosed in 2018, history of sessile serrated and adenomatous colon polyps, 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   1.  Left-sided ulcerative colitis/CT scan of the pelvis showing colitis recently --he has had a flare of his ulcerative colitis based on clinical symptoms.  They have improved with increasing the mesalamine therapy and antibiotics have also been helpful which raises the question of possible concomitant diverticular inflammation.  We need to avoid steroids due to his chorioretinopathy and I have discussed with him today that we may need to escalate medical therapy with either Humira or Entyvio.  We discussed both drugs today.  I am going to repeat colonoscopy next week to  assess disease activity before definitively deciding on medical therapy changes --Continue Lialda 4.8 g daily --Continue Canasa 1 g nightly --Complete antibiotics today and I am not lengthening the course at present --Colonoscopy next week  2.  GERD with history of Barrett's --continue daily PPI.  Surveillance endoscopy May 2022  3.  History of sessile serrated and adenomatous polyps --surveillance colonoscopy was due March 2023 but the above colonoscopy will satisfy this requirement.  Future colonoscopy to be based on findings next week.  4.  Right groin pain --no definitive evidence of right inguinal hernia by CT scan.  Pain is classic for inguinal hernia type pain and we will rule out other right-sided pathology at colonoscopy.  Pain is lower than I would expect for colonic or ileal involvement.  He should follow up with Dr. Marlou Starks as well.  30 minutes total  spent today including patient facing time, coordination of care, reviewing medical history/procedures/pertinent radiology studies, and documentation of the encounter.

## 2019-09-27 NOTE — Telephone Encounter (Signed)
No -will see him Sept 16

## 2019-09-27 NOTE — Telephone Encounter (Signed)
He is taking taking iron, he does not know how much he will bring bottle to his appt on Thurday. Can not add ferritin did not collect SST tube, he has a colonoscopy scheduled on Monday 09/30/19. He is coming on 9/16 for a 1 month f/u, does he need to come in before for ferritin?

## 2019-09-30 ENCOUNTER — Ambulatory Visit: Payer: Medicare Other | Admitting: Internal Medicine

## 2019-09-30 ENCOUNTER — Other Ambulatory Visit: Payer: Self-pay

## 2019-09-30 ENCOUNTER — Ambulatory Visit (AMBULATORY_SURGERY_CENTER): Payer: Medicare Other | Admitting: Internal Medicine

## 2019-09-30 ENCOUNTER — Other Ambulatory Visit: Payer: Medicare Other

## 2019-09-30 ENCOUNTER — Encounter: Payer: Self-pay | Admitting: Internal Medicine

## 2019-09-30 VITALS — BP 96/62 | HR 69 | Temp 97.5°F | Resp 14 | Ht 70.0 in | Wt 180.0 lb

## 2019-09-30 DIAGNOSIS — K648 Other hemorrhoids: Secondary | ICD-10-CM

## 2019-09-30 DIAGNOSIS — K5150 Left sided colitis without complications: Secondary | ICD-10-CM | POA: Diagnosis not present

## 2019-09-30 DIAGNOSIS — K51511 Left sided colitis with rectal bleeding: Secondary | ICD-10-CM

## 2019-09-30 DIAGNOSIS — K5730 Diverticulosis of large intestine without perforation or abscess without bleeding: Secondary | ICD-10-CM | POA: Diagnosis not present

## 2019-09-30 DIAGNOSIS — K529 Noninfective gastroenteritis and colitis, unspecified: Secondary | ICD-10-CM | POA: Diagnosis not present

## 2019-09-30 MED ORDER — SODIUM CHLORIDE 0.9 % IV SOLN: 500.0000 mL | Freq: Once | INTRAVENOUS | Status: DC

## 2019-09-30 NOTE — Patient Instructions (Signed)
TO LAB PRIOR TO DISCHARGE  CONTINUE LIALDA 4.8.G DAILY  CANASA CAN BE USED ONE GRAM AT BEDTIME   AWAIT PATHOLOGY RESULTS   RESUME PREVIOUS DIET  HANDOUT ON DIVERTICULOSIS & HEMORRHOIDS GIVEN TO YOU TODAY    YOU HAD AN ENDOSCOPIC PROCEDURE TODAY AT [place]:   Refer to the procedure report that was given to you for any specific questions about what was found during the examination.  If the procedure report does not answer your questions, please call your gastroenterologist to clarify.  If you requested that your care partner not be given the details of your procedure findings, then the procedure report has been included in a sealed envelope for you to review at your convenience later.  YOU SHOULD EXPECT: Some feelings of bloating in the abdomen. Passage of more gas than usual.  Walking can help get rid of the air that was put into your GI tract during the procedure and reduce the bloating. If you had a lower endoscopy (such as a colonoscopy or flexible sigmoidoscopy) you may notice spotting of blood in your stool or on the toilet paper. If you underwent a bowel prep for your procedure, you may not have a normal bowel movement for a few days.  Please Note:  You might notice some irritation and congestion in your nose or some drainage.  This is from the oxygen used during your procedure.  There is no need for concern and it should clear up in a day or so.  SYMPTOMS TO REPORT IMMEDIATELY:   Following lower endoscopy (colonoscopy or flexible sigmoidoscopy):  Excessive amounts of blood in the stool  Significant tenderness or worsening of abdominal pains  Swelling of the abdomen that is new, acute  Fever of 100F or higher   Following upper endoscopy (EGD)  Vomiting of blood or coffee ground material  New chest pain or pain under the shoulder blades  Painful or persistently difficult swallowing  New shortness of breath  Fever of 100F or higher  Black,  tarry-looking stools  For urgent or emergent issues, a gastroenterologist can be reached at any hour by calling [phone]. Do not use MyChart messaging for urgent concerns.    DIET:  We do recommend a small meal at first, but then you may proceed to your regular diet.  Drink plenty of fluids but you should avoid alcoholic beverages for 24 hours.  ACTIVITY:  You should plan to take it easy for the rest of today and you should NOT DRIVE or use heavy machinery until tomorrow (because of the sedation medicines used during the test).    FOLLOW UP: Our staff will call the number listed on your records 48-72 hours following your procedure to check on you and address any questions or concerns that you may have regarding the information given to you following your procedure. If we do not reach you, we will leave a message.  We will attempt to reach you two times.  During this call, we will ask if you have developed any symptoms of COVID 19. If you develop any symptoms (ie: fever, flu-like symptoms, shortness of breath, cough etc.) before then, please call [phone].  If you test positive for Covid 19 in the 2 weeks post procedure, please call and report this information to [place].    If any biopsies were taken you will be contacted by phone or by letter within the next 1-3 weeks.  Please call us at [phone] if you  have not heard about the biopsies in 3 weeks.    SIGNATURES/CONFIDENTIALITY: You and/or your care partner have signed paperwork which will be entered into your electronic medical record.  These signatures attest to the fact that that the information above on your After Visit Summary has been reviewed and is understood.  Full responsibility of the confidentiality of this discharge information lies with you and/or your care-partner.

## 2019-09-30 NOTE — Progress Notes (Signed)
Report given to PACU, vss 

## 2019-09-30 NOTE — Op Note (Signed)
[facility] Patient Name: James [name] Procedure Date: 09/30/2019 3:25 PM MRN: [id number] Endoscopist: Jerene Bears , MD Age: 64 Referring MD:  Date of Birth: 12-22-1955 Gender: Male Account #: [id number] Procedure:                Colonoscopy Indications:              Disease activity assessment of left-sided chronic                            segmental colitis, recent flare clinically                            improving after returning from Lialda 2.4 g daily                            to 4.8 g daily, also using Canasa 1 g qHS Medicines:                Monitored Anesthesia Care Procedure:                Pre-Anesthesia Assessment:                           - Prior to the procedure, a History and Physical                            was performed, and patient medications and                            allergies were reviewed. The patient's tolerance of                            previous anesthesia was also reviewed. The risks                            and benefits of the procedure and the sedation                            options and risks were discussed with the patient.                            All questions were answered, and informed consent                            was obtained. Prior Anticoagulants: The patient has                            taken no previous anticoagulant or antiplatelet                            agents. ASA Grade Assessment: II - A patient with                            mild systemic disease. After reviewing the risks  and benefits, the patient was deemed in                            satisfactory condition to undergo the procedure.                           After obtaining informed consent, the colonoscope                            was passed under direct vision. Throughout the                            procedure, the patient's blood pressure, pulse, and                            oxygen saturations were  monitored continuously. The                            Colonoscope was introduced through the anus and                            advanced to the cecum, identified by appendiceal                            orifice and ileocecal valve. The colonoscopy was                            performed without difficulty. The patient tolerated                            the procedure well. The quality of the bowel                            preparation was good. The ileocecal valve,                            appendiceal orifice, and rectum were photographed. Scope In: 3:50:56 PM Scope Out: 4:07:53 PM Scope Withdrawal Time: 0 hours 10 minutes 36 seconds  Total Procedure Duration: 0 hours 16 minutes 57 seconds  Findings:                 Hemorrhoids were found on perianal exam.                           Inflammation characterized by adherent blood,                            congestion (edema), erosions, erythema, friability,                            granularity, loss of vascularity and pseudopolyps                            was found in a continuous and circumferential  pattern from the sigmoid colon to the transverse                            colon. The rectum, the distal sigmoid colon, the                            proximal transverse colon, the ascending colon and                            the cecum were spared. This was severe. Multiple                            biopsies with cold forceps.                           Multiple small and large-mouthed diverticula were                            found in the sigmoid colon and descending colon.                           Internal hemorrhoids were found during retroflexion                            and during digital exam. The hemorrhoids were                            medium-sized. Complications:            No immediate complications. Estimated Blood Loss:     Estimated blood loss was minimal. Impression:                - Left-sided colitis. Moderate to severe                            inflammation was found from the mid sigmoid colon                            to the distal transverse colon. Biopsied.                           - Diverticulosis in the sigmoid colon and in the                            descending colon.                           - Internal hemorrhoids.                           - No specimens collected. Recommendation:           - Patient has a contact number available for                            emergencies. The signs and symptoms of potential  delayed complications were discussed with the                            patient. Return to normal activities tomorrow.                            Written discharge instructions were provided to the                            patient.                           - Resume previous diet.                           - Continue present medications including Lialda 4.8                            g daily. Canasa can be used 1 g at bedtime but                            current no evidence of proctitis (rectal                            inflammation). Any red blood with BM/wiping is                            likely related to internal hemorrhoids.                           - Await pathology results.                           - Lab work today in anticipation of biologic                            therapy. Steroids are being avoided due to eye                            disease.                           - Repeat colonoscopy is recommended. The                            colonoscopy date will be determined after pathology                            results from today's exam become available for                            review. Jerene Bears, MD 09/30/2019 4:26:57 PM This report has been signed electronically.

## 2019-09-30 NOTE — Progress Notes (Signed)
Called to room to assist during endoscopic procedure.  Patient ID and intended procedure confirmed with present staff. Received instructions for my participation in the procedure from the performing physician.  

## 2019-10-02 ENCOUNTER — Telehealth: Payer: Self-pay | Admitting: *Deleted

## 2019-10-02 ENCOUNTER — Telehealth: Payer: Self-pay

## 2019-10-02 LAB — QUANTIFERON-TB GOLD PLUS
Mitogen-NIL: 9.43 IU/mL
NIL: 0.01 IU/mL
QuantiFERON-TB Gold Plus: NEGATIVE
TB1-NIL: 0.01 IU/mL
TB2-NIL: 0.01 IU/mL

## 2019-10-02 LAB — HEPATITIS B SURFACE ANTIBODY,QUALITATIVE: Hep B S Ab: NONREACTIVE

## 2019-10-02 LAB — HEPATITIS B SURFACE ANTIGEN: Hepatitis B Surface Ag: NONREACTIVE

## 2019-10-02 NOTE — Telephone Encounter (Signed)
1st follow up call made.  NALM 

## 2019-10-02 NOTE — Telephone Encounter (Signed)
Message left

## 2019-10-03 ENCOUNTER — Other Ambulatory Visit: Payer: Self-pay

## 2019-10-03 ENCOUNTER — Ambulatory Visit (INDEPENDENT_AMBULATORY_CARE_PROVIDER_SITE_OTHER): Payer: Medicare Other | Admitting: Internal Medicine

## 2019-10-03 ENCOUNTER — Ambulatory Visit: Payer: Medicare Other | Admitting: Internal Medicine

## 2019-10-03 ENCOUNTER — Encounter: Payer: Self-pay | Admitting: Internal Medicine

## 2019-10-03 VITALS — BP 110/70 | HR 64 | Ht 71.0 in | Wt 182.0 lb

## 2019-10-03 DIAGNOSIS — Z23 Encounter for immunization: Secondary | ICD-10-CM

## 2019-10-03 DIAGNOSIS — F411 Generalized anxiety disorder: Secondary | ICD-10-CM

## 2019-10-03 DIAGNOSIS — R1031 Right lower quadrant pain: Secondary | ICD-10-CM | POA: Diagnosis not present

## 2019-10-03 DIAGNOSIS — F439 Reaction to severe stress, unspecified: Secondary | ICD-10-CM

## 2019-10-03 NOTE — Progress Notes (Signed)
   Subjective:    Patient ID: James [name], male    DOB: 1955-06-30, 64 y.o.   MRN: [id number]  HPI 64 year old Male for follow up on anemia. Taking OTC iron 65 mg daily. Still anemic with Hgb 11.7 grams and likely due to inflammatory bowel disease.   Recently had CT scan to rule out right inguinal hernia. No definite hernia noted.Sigmoid diverticulosis  noted as well as changes involving the distal descending colon  noted consistent with colitis.  Still has burning right inguinal area and this has him concerned.my guess it is is related to his colitis.  He had colonoscopy Sept 13 showing hemorrhoids.  Found to have inflammation characterized by congestion/edema or erosions erythema friability granularity, loss of vascularity and pseudopolyps in a continuous and circumferential pattern from the sigmoid colon to the transverse colon.  The rectum, distal sigmoid colon proximal transverse colon, ascending colon and the cecum were spared.  Multiple large mouthed diverticula were noted in the sigmoid colon and descending colon.  Internal hemorrhoids were found.  He remains on Lialda 4.8 g daily.  Currently no evidence of rectal inflammation.  Bright red blood with wiping is likely due to internal hemorrhoids according to Dr. Hilarie Fredrickson.  Going to [place] in November for 2 weeks. Wife has metastatic lung cancer.  He was seen by a surgeon, Dr. Marlou Starks August 24.  Has been having right-sided inguinal pain for several months.  He is anxious about his wife's health and state of mind.  He has many questions about this today and I took some time to answer his questions as best I could.  However, she has metastatic lung cancer with mets to the brain.  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  Review of Systems see above     Objective:   Physical Exam  Vital signs reviewed and are stable.  BMI 25.38 weight 182 pounds.  I do not appreciate a definite hernia on direct palpation today.   Clearly he is grieving his wife's illness and is stressed.      Assessment & Plan:  Right inguinal discomfort-etiology unclear possibly related to inflammatory bowel disease but CT did not reveal definite hernia.  Anxiety state due to stress with wife's illness  Inflammatory bowel disease treated by Dr. Hilarie Fredrickson  Continue to take iron sulfate once daily for mild iron deficiency related to inflammatory bowel disease.  Plan: He just had Welcome to Medicare visit in August 2121. Generally seen here once a year.  He knows he can contact me at any time.

## 2019-10-07 ENCOUNTER — Other Ambulatory Visit: Payer: Self-pay

## 2019-10-07 MED ORDER — BUDESONIDE ER 9 MG PO TB24: 9.0000 mg | ORAL_TABLET | Freq: Every day | ORAL | 1 refills | Status: DC

## 2019-11-08 ENCOUNTER — Telehealth: Payer: Self-pay | Admitting: Internal Medicine

## 2019-11-08 NOTE — Telephone Encounter (Signed)
Pt notified via mychart

## 2019-11-08 NOTE — Telephone Encounter (Signed)
Pt states he was started on Uceris 40m/day and told to call back with an update. Reports he is doing much better but still not normal. He has 1 refill on the prescription and wants to know if he should refill it and take it for another month. Per OV note pt is to take the Uceris for 6weeks so he will get the script refilled. He is asking about Humira vs entyvio as it is open enrollment for Medicare part B. He wants to know which one Dr. PHilarie Fredricksonwould plan to put him on if he needs to start a biologic. Please advise.

## 2019-11-08 NOTE — Telephone Encounter (Signed)
Choice of biologic is a great question.  Either would be good choices and ideally I would like to have an insurance plan that would give [place] both as options.  If I recall from our discussions he was leaning towards Entyvio and I think I am too (if we have to escalate therapy) Glad he is feeling better with uceris, keep it going for 2-4 more weeks and he should stop it and notify me if he has vision changes

## 2019-11-08 NOTE — Telephone Encounter (Signed)
Pt is wanting to update the nurse about how his steroids have been helping him and also some questions about some medications the provider wants to start him on.

## 2019-11-10 NOTE — Patient Instructions (Signed)
I do not palpate an inguinal hernia directly.  It did not appear on CT scan ordered by Dr. Marlou Starks.  Would suggest taking iron supplement once a day.  Continue with Xanax for anxiety.  Follow-up here as needed.  Please have a good trip to [place] in November.

## 2019-11-19 NOTE — Telephone Encounter (Signed)
I recommend CT abdomen with contrast, only CT pelvis was done previously Indication right lower abdominal pain I also would recommend he go back to see surgery, Dr. Marlou Starks.  If he would like to see another surgeon, let me know and we can refer but I have confidence in Dr. Marlou Starks.

## 2019-11-20 ENCOUNTER — Other Ambulatory Visit: Payer: Self-pay

## 2019-11-20 ENCOUNTER — Telehealth: Payer: Self-pay | Admitting: Internal Medicine

## 2019-11-20 DIAGNOSIS — R1031 Right lower quadrant pain: Secondary | ICD-10-CM

## 2019-11-20 DIAGNOSIS — G8929 Other chronic pain: Secondary | ICD-10-CM

## 2019-11-20 NOTE — Telephone Encounter (Signed)
James [name] [phone]  [name] called to say he now has head and chest congestion, coughing up brownish green stuff, runny nose, sore throat, no fever. Has had COVID-19 and Flu vaccines

## 2019-11-21 ENCOUNTER — Ambulatory Visit (INDEPENDENT_AMBULATORY_CARE_PROVIDER_SITE_OTHER): Payer: Medicare Other | Admitting: Internal Medicine

## 2019-11-21 ENCOUNTER — Encounter: Payer: Self-pay | Admitting: Internal Medicine

## 2019-11-21 ENCOUNTER — Other Ambulatory Visit: Payer: Self-pay

## 2019-11-21 VITALS — Temp 98.3°F | Ht 71.0 in

## 2019-11-21 DIAGNOSIS — R059 Cough, unspecified: Secondary | ICD-10-CM

## 2019-11-21 DIAGNOSIS — J22 Unspecified acute lower respiratory infection: Secondary | ICD-10-CM

## 2019-11-21 LAB — POCT RAPID STREP A (OFFICE): Rapid Strep A Screen: NEGATIVE

## 2019-11-21 MED ORDER — CLARITHROMYCIN 500 MG PO TABS: 500.0000 mg | ORAL_TABLET | Freq: Two times a day (BID) | ORAL | 0 refills | Status: DC

## 2019-11-21 MED ORDER — HYDROCODONE-HOMATROPINE 5-1.5 MG/5ML PO SYRP
5.0000 mL | ORAL_SOLUTION | Freq: Three times a day (TID) | ORAL | 0 refills | Status: DC | PRN
Start: 2019-11-21 — End: 2020-02-03

## 2019-11-21 NOTE — Progress Notes (Signed)
   Subjective:    Patient ID: James [name], male    DOB: 1955-07-03, 64 y.o.   MRN: [id number]  HPI 64 year old Male seen with cough, scratchy throat and congestion. Going to [place] on vacation in about a week. Wife has had similar illness. Was around grandchildren this past weekend but had symptoms before that.  No documented fever or chills.  No vomiting.  No headache.  No dysgeusia.  Has had 2 Covid immunizations.  Has had high-dose flu vaccine.  Wife has history of lung cancer and is undergoing chemotherapy per Dr. Lorenso Courier.  Patient has left-sided chronic colitis treated with Lialda and followed by Dr. Hilarie Fredrickson.  Review of Systems see above     Objective:   Physical Exam  Temperature 98.3 degrees.  TMs are clear.  Pharynx slightly injected.  Neck is supple.  Chest clear to auscultation but he sounds nasally congested when he speaks. Respiratory virus panel and COVID-19 test obtained today.      Assessment & Plan:  Acute respiratory infection.  COVID-19 test and respiratory virus panel obtained. Apparently around sick grandchildren over the weekend. Wife has similar illness.  Plan: Biaxin 500 mg twice daily for 10 days.  Hycodan 1 teaspoon every 8 hours as needed for cough.  Rest and drink plenty of fluids. Advised quarantine at home until panels are back.  Addendum: Respiratory virus panel and COVID-19 tests are negative

## 2019-11-21 NOTE — Telephone Encounter (Signed)
Scheduled

## 2019-11-21 NOTE — Telephone Encounter (Signed)
Needs car visit

## 2019-11-22 LAB — RESPIRATORY VIRUS PANEL

## 2019-11-22 LAB — SARS-COV-2 RNA,(COVID-19) QUALITATIVE NAAT: SARS CoV2 RNA: NOT DETECTED

## 2019-11-28 ENCOUNTER — Encounter (HOSPITAL_COMMUNITY): Payer: Self-pay

## 2019-11-28 ENCOUNTER — Ambulatory Visit (HOSPITAL_COMMUNITY)
Admission: RE | Admit: 2019-11-28 | Discharge: 2019-11-28 | Disposition: A | Discharge status: Home / Self Care | Payer: Medicare Other | Source: Ambulatory Visit | Attending: Internal Medicine | Admitting: Internal Medicine

## 2019-11-28 ENCOUNTER — Other Ambulatory Visit: Payer: Self-pay

## 2019-11-28 DIAGNOSIS — G8929 Other chronic pain: Secondary | ICD-10-CM | POA: Diagnosis present

## 2019-11-28 DIAGNOSIS — R1031 Right lower quadrant pain: Secondary | ICD-10-CM | POA: Insufficient documentation

## 2019-11-28 LAB — POCT I-STAT CREATININE: Creatinine, Ser: 1 mg/dL (ref 0.61–1.24)

## 2019-11-28 MED ORDER — IOHEXOL 300 MG/ML  SOLN
100.0000 mL | Freq: Once | INTRAMUSCULAR | Status: AC | PRN
  Administered 2019-11-28: 100 mL via INTRAVENOUS

## 2019-12-06 ENCOUNTER — Telehealth: Payer: Self-pay | Admitting: Internal Medicine

## 2019-12-06 NOTE — Telephone Encounter (Signed)
[facility] [phone]  [name] called to see if it was alright if he took some of Trish medication (FUROSEMIDE) for his feet swelling, they are in [place]. Dr Renold Genta was behind me and heard conversation and she said No.

## 2019-12-14 ENCOUNTER — Encounter: Payer: Self-pay | Admitting: Internal Medicine

## 2019-12-14 NOTE — Patient Instructions (Addendum)
COVID-19 and respiratory virus panels are negative.  Rest and drink plenty of fluids.  Take Biaxin 500 mg twice daily for 10 days and Hycodan 1 teaspoon every 8 hours as needed for cough and sore throat pain.

## 2020-01-24 ENCOUNTER — Other Ambulatory Visit: Payer: Self-pay | Admitting: Internal Medicine

## 2020-01-24 NOTE — Telephone Encounter (Signed)
James [name], [name] requesting a refill? We had intended for this to be 6-8 weeks of therapy and then back to mesalamine only

## 2020-01-28 ENCOUNTER — Encounter: Payer: Self-pay | Admitting: *Deleted

## 2020-02-03 ENCOUNTER — Telehealth (INDEPENDENT_AMBULATORY_CARE_PROVIDER_SITE_OTHER): Payer: Medicare Other | Admitting: Internal Medicine

## 2020-02-03 ENCOUNTER — Encounter: Payer: Self-pay | Admitting: Internal Medicine

## 2020-02-03 ENCOUNTER — Other Ambulatory Visit: Payer: Self-pay

## 2020-02-03 ENCOUNTER — Ambulatory Visit: Payer: Medicare Other | Admitting: Internal Medicine

## 2020-02-03 VITALS — Ht 71.0 in | Wt 185.0 lb

## 2020-02-03 DIAGNOSIS — K4090 Unilateral inguinal hernia, without obstruction or gangrene, not specified as recurrent: Secondary | ICD-10-CM

## 2020-02-03 DIAGNOSIS — K51511 Left sided colitis with rectal bleeding: Secondary | ICD-10-CM

## 2020-02-03 DIAGNOSIS — K5903 Drug induced constipation: Secondary | ICD-10-CM | POA: Diagnosis not present

## 2020-02-03 DIAGNOSIS — D500 Iron deficiency anemia secondary to blood loss (chronic): Secondary | ICD-10-CM

## 2020-02-03 DIAGNOSIS — K2270 Barrett's esophagus without dysplasia: Secondary | ICD-10-CM

## 2020-02-03 DIAGNOSIS — K219 Gastro-esophageal reflux disease without esophagitis: Secondary | ICD-10-CM

## 2020-02-03 MED ORDER — IRON (FERROUS SULFATE) 325 (65 FE) MG PO TABS: 325.0000 mg | ORAL_TABLET | Freq: Two times a day (BID) | ORAL | Status: DC

## 2020-02-03 MED ORDER — POLYETHYLENE GLYCOL 3350 17 GM/SCOOP PO POWD: 1.0000 | Freq: Two times a day (BID) | ORAL | 99 refills | Status: DC

## 2020-02-03 NOTE — Progress Notes (Signed)
Subjective:    Patient ID: James [name], male    DOB: 07/27/1955, 65 y.o.   MRN: [id number]   This service was provided via telemedicine.  MyChart video visit The patient was located at home The provider was located in provider's home. The patient did consent to this telephone visit and is aware of possible charges through their insurance for this visit.   The persons participating in this telemedicine service were the patient and I. 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  He is seen virtually by MyChart video visit today.  I last saw him in the office on 09/27/2019 and for colonoscopy on 09/30/2019.  Colonoscopy on 09/30/2019 showed left-sided colitis which was moderate to severe from the mid sigmoid to the distal transverse colon.  This was biopsied.  There was left-sided diverticulosis and internal hemorrhoids.  The pathology showed mildly active chronic colitis consistent with IBD.  No granulomas.  After colonoscopy we increased his Lialda back from 2.4 to 4.8 g daily and consider biologic therapy.  It was his opinion to try Uceris as there was some reluctance to biologic therapy.  We were concerned about using budesonide with his history of chorioretinopathy.  He discussed this with his ophthalmologist, and after discussing the risks, he elected to proceed with Uceris therapy.  He took Uceris 9 mg daily for 8 weeks.  He reports today that he feels that his colitis is under much better control.  He is having ongoing issue with progressing worsening discomfort in his right inguinal area due to hernia.  This was seen on a CT scan which I ordered in  November and he is scheduled for hernia repair with Dr. Marlou Starks later this week on Friday.  His right inguinal hernia really is uncomfortable when he is standing still and he notes a palpable bulge.  He is able to self reduce this at times.  It does not bother him as much if he walks.  Intermittently he will have a sharp pain but on the whole it is mostly just uncomfortable.  He has had previous left inguinal hernia repair.  His bowel movements of late have been somewhat harder with more constipation due to the increased oral iron prescribed by Dr. Renold Genta his primary care provider.  He notes that Dr. Renold Genta found him to be more anemic with low iron not responding to once daily dosing so he has been taking ferrous sulfate 325 milligrams twice daily and using MiraLAX 17 g daily.  With the MiraLAX twice daily his bowel movements have been easier and softer.  He has not seen blood in his stool occasionally scant blood with wiping.  No melena.  He has not noted stools to be darker with the oral iron.  He is not having belly pain other than the previously mentioned right inguinal hernia discomfort.  His vision he notes that the "fog spot" has moved closer to center.   Review of Systems As per HPI, otherwise negative  Current Medications, Allergies, Past Medical History, Past Surgical History, Family History and Social History were reviewed in [employer] record.     Objective:   Physical Exam Ht 5'  11" (1.803 m)   Wt 185 lb (83.9 kg)   BMI 25.80 kg/m  General: Awake alert, pleasant and well-appearing by video visit  CT ABDOMEN AND PELVIS WITH CONTRAST   TECHNIQUE: Multidetector CT imaging of the abdomen and pelvis was performed using the standard protocol following bolus administration of intravenous contrast.   CONTRAST:  127m OMNIPAQUE IOHEXOL 300 MG/ML  SOLN   COMPARISON:  None.   FINDINGS: Lower chest: No acute abnormality.   Hepatobiliary: No focal liver  abnormality is seen. No gallstones, gallbladder wall thickening, or biliary dilatation.   Pancreas: Unremarkable. No pancreatic ductal dilatation or surrounding inflammatory changes.   Spleen: Normal in size without focal abnormality.   Adrenals/Urinary Tract: Normal appearance of the adrenal glands. The kidneys are unremarkable. Urinary bladder appears normal.   Stomach/Bowel: Stomach appears partially collapsed. The appendix is visualized and appears normal. No small bowel wall thickening, inflammation, or distension. The colon appears unremarkable up to the level of the distal descending colon where there is a short segment of mild wall thickening. The wall thickening is improved when compared with the previous exam with resolution of previous surrounding inflammation. No signs of luminal stenosis or bowel obstruction.   Vascular/Lymphatic: Aortic atherosclerosis. No aneurysm. No abdominopelvic adenopathy.   Reproductive: Prostate gland appears enlarged.   Other: No free fluid or fluid collections within the abdomen or pelvis. Fat containing right inguinal hernia.   Musculoskeletal: Previous right hip arthroplasty. Degenerative changes noted within the left hip. Lumbar spondylosis.   IMPRESSION: 1. No acute findings identified within the abdomen or pelvis. The appendix is visualized and appears normal. 2. Interval improvement in segmental colitis involving the distal descending colon with decreased wall thickening and surrounding inflammation. No new sites of bowel inflammation identified. 3. Aortic atherosclerosis. 4. Fat containing right inguinal hernia.   Aortic Atherosclerosis (ICD10-I70.0).     Electronically Signed   By: TKerby MoorsM.D.   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   1.  Left-sided colitis --colitis seems to have come under much better control with the higher dose of mesalamine and after 8 weeks of Uceris therapy.  This was also illustrated 2 months ago on a CT scan which was performed of the abdomen pelvis..  I do not wish to continue him on budesonide which we have discussed today.  He is in agreement.  At some point we need an objective measure of his colitis activity and still entertain the possibility of the need for biologic therapy.  For now we will continue as follows: -- Inguinal hernia surgery later this week, see below -- Continue Lialda 4.8 g daily -- Remain off of Uceris  --follow-up in about 3 months, consider fecal calprotectin versus colonoscopy to objectively assess disease activity of left-sided colitis  2.  IDA --secondary to #1.  Continue ferrous sulfate and follow-up blood counts and iron studies -- Continue ferrous sulfate 325 mg twice daily -- Repeat CBC, ferritin plus IBC panel in the next several weeks  3.  Constipation --induced by oral iron -- Continue MiraLAX 17 g twice daily  4.  Right inguinal hernia --to be repaired later this week with Dr. TMarlou Starks  I would like him to heal from his inguinal hernia surgery after which time we can better objectively assess abdominal symptoms as it relates to #1  5.  GERD with Barrett's esophagus --continue daily PPI.  Surveillance endoscopy later this year around May 2022.  Discuss at follow-up  Office follow-up with me in about 3 months

## 2020-02-03 NOTE — Patient Instructions (Addendum)
Please continue your iron 2 times a day Please continue Miralax 17 Gm two times a day  Please come for lab work in the next few weeks  Dr. Hilarie Fredrickson woud like to see you back in the office in 3 months. He will discuss a repeat upper endoscopy at that time. Please call the office to schedule an office visit for April.   Due to recent changes in healthcare laws, you may see the results of your imaging and laboratory studies on MyChart before your provider has had a chance to review them.  We understand that in some cases there may be results that are confusing or concerning to you. Not all laboratory results come back in the same time frame and the provider may be waiting for multiple results in order to interpret others.  Please give [place] 48 hours in order for your provider to thoroughly review all the results before contacting the office for clarification of your results.   If you are age 66 or older, your body mass index should be between 23-30. Your Body mass index is 25.8 kg/m. If this is out of the aforementioned range listed, please consider follow up with your Primary Care Provider.  If you are age 80 or younger, your body mass index should be between 19-25. Your Body mass index is 25.8 kg/m. If this is out of the aformentioned range listed, please consider follow up with your Primary Care Provider.

## 2020-02-03 NOTE — Addendum Note (Signed)
Addended by: Marlon Pel on: 02/03/2020 02:02 PM   Modules accepted: Orders

## 2020-03-19 ENCOUNTER — Other Ambulatory Visit: Payer: Self-pay

## 2020-03-19 ENCOUNTER — Other Ambulatory Visit: Payer: Medicare Other | Admitting: Internal Medicine

## 2020-03-19 DIAGNOSIS — E7800 Pure hypercholesterolemia, unspecified: Secondary | ICD-10-CM

## 2020-03-19 DIAGNOSIS — D649 Anemia, unspecified: Secondary | ICD-10-CM

## 2020-03-20 LAB — IRON,TIBC AND FERRITIN PANEL
%SAT: 65 % (calc) — ABNORMAL HIGH (ref 20–48)
Ferritin: 68 ng/mL (ref 24–380)
Iron: 193 ug/dL — ABNORMAL HIGH (ref 50–180)
TIBC: 298 mcg/dL (calc) (ref 250–425)

## 2020-03-20 LAB — CBC WITH DIFFERENTIAL/PLATELET
Absolute Monocytes: 593 cells/uL (ref 200–950)
Basophils Absolute: 38 cells/uL (ref 0–200)
Basophils Relative: 0.5 %
Eosinophils Absolute: 278 cells/uL (ref 15–500)
Eosinophils Relative: 3.7 %
HCT: 40.9 % (ref 38.5–50.0)
Hemoglobin: 13.6 g/dL (ref 13.2–17.1)
Lymphs Abs: 1500 cells/uL (ref 850–3900)
MCH: 31.3 pg (ref 27.0–33.0)
MCHC: 33.3 g/dL (ref 32.0–36.0)
MCV: 94 fL (ref 80.0–100.0)
MPV: 11.7 fL (ref 7.5–12.5)
Monocytes Relative: 7.9 %
Neutro Abs: 5093 cells/uL (ref 1500–7800)
Neutrophils Relative %: 67.9 %
Platelets: 228 10*3/uL (ref 140–400)
RBC: 4.35 10*6/uL (ref 4.20–5.80)
RDW: 12 % (ref 11.0–15.0)
Total Lymphocyte: 20 %
WBC: 7.5 10*3/uL (ref 3.8–10.8)

## 2020-03-20 LAB — HEPATIC FUNCTION PANEL
AG Ratio: 1.5 (calc) (ref 1.0–2.5)
ALT: 9 U/L (ref 9–46)
AST: 17 U/L (ref 10–35)
Albumin: 4.3 g/dL (ref 3.6–5.1)
Alkaline phosphatase (APISO): 51 U/L (ref 35–144)
Bilirubin, Direct: 0.1 mg/dL (ref 0.0–0.2)
Globulin: 2.9 g/dL (calc) (ref 1.9–3.7)
Indirect Bilirubin: 0.5 mg/dL (calc) (ref 0.2–1.2)
Total Bilirubin: 0.6 mg/dL (ref 0.2–1.2)
Total Protein: 7.2 g/dL (ref 6.1–8.1)

## 2020-03-20 LAB — LIPID PANEL
Cholesterol: 213 mg/dL — ABNORMAL HIGH (ref <200)
HDL: 85 mg/dL (ref >=40)
LDL Cholesterol (Calc): 111 mg/dL (calc) — ABNORMAL HIGH
Non-HDL Cholesterol (Calc): 128 mg/dL (calc) (ref <130)
Total CHOL/HDL Ratio: 2.5 (calc) (ref <5.0)
Triglycerides: 84 mg/dL (ref <150)

## 2020-03-24 ENCOUNTER — Other Ambulatory Visit: Payer: Medicare Other | Admitting: Internal Medicine

## 2020-03-26 ENCOUNTER — Ambulatory Visit
Admission: RE | Admit: 2020-03-26 | Discharge: 2020-03-26 | Disposition: A | Discharge status: Home / Self Care | Payer: Medicare Other | Source: Ambulatory Visit | Attending: Internal Medicine | Admitting: Internal Medicine

## 2020-03-26 ENCOUNTER — Other Ambulatory Visit: Payer: Self-pay

## 2020-03-26 ENCOUNTER — Ambulatory Visit (INDEPENDENT_AMBULATORY_CARE_PROVIDER_SITE_OTHER): Payer: Medicare Other | Admitting: Internal Medicine

## 2020-03-26 ENCOUNTER — Encounter: Payer: Self-pay | Admitting: Internal Medicine

## 2020-03-26 VITALS — BP 110/80 | HR 72 | Ht 71.0 in | Wt 194.0 lb

## 2020-03-26 DIAGNOSIS — R972 Elevated prostate specific antigen [PSA]: Secondary | ICD-10-CM | POA: Diagnosis not present

## 2020-03-26 DIAGNOSIS — R059 Cough, unspecified: Secondary | ICD-10-CM | POA: Diagnosis not present

## 2020-03-26 DIAGNOSIS — N401 Enlarged prostate with lower urinary tract symptoms: Secondary | ICD-10-CM

## 2020-03-26 DIAGNOSIS — R350 Frequency of micturition: Secondary | ICD-10-CM

## 2020-03-26 MED ORDER — TAMSULOSIN HCL 0.4 MG PO CAPS: 0.4000 mg | ORAL_CAPSULE | Freq: Every day | ORAL | 1 refills | Status: DC

## 2020-03-26 NOTE — Progress Notes (Signed)
   Subjective:    Patient ID: James [name], male    DOB: Aug 23, 1955, 65 y.o.   MRN: [id number]  HPI 65 year old Male here today to follow-up on iron deficiency anemia.  Recent iron studies Show normal ferritin of 68.  Total iron was 193 with iron binding capacity of 298.  Iron level was slightly elevated.  In 2018 his iron level was 46.  He has colitis which is interfering with iron absorption.  He is on myalgia, Crestor, Flomax and Protonix as well as Xanax.  His wife has metastatic lung cancer and he is under a lot of stress.  He is also developed a cough.  He had a cough in November.  COVID-19 respiratory virus panels were obtained and he was treated with Biaxin and Hycodan at that time.  These panels were negative.  He has had 3 maternal COVID vaccines.  He has had Prevnar 13 and pneumococcal 23.  He had a flu vaccine in 2021.  Due to history of coughing for several weeks, he had a chest x-ray which was negative.  He has a history of GE reflux treated with Protonix, he is on Crestor for hyperlipidemia and Flomax as well as Xanax.   Review of Systems see above no fever or chills     Objective:   Physical Exam Vital signs reviewed.  He is afebrile.  Pulse oximetry is 97%.  Skin warm and dry.  No cervical adenopathy.  No carotid bruits.  Chest is clear to auscultation.  Cardiac exam: Regular rate and rhythm.  No lower extremity edema.       Assessment & Plan:  Cough-etiology unclear.  He could have a viral respiratory infection or allergic rhinitis.   We are going to continue to observe his cough.  His x-ray does not show pneumonia.  He will continue with Protonix.  Elevated PSA-it was 5.0 months ago.  We have repeated it and is 2.37.  He is having some issues with BPH and will be started on Flomax 0.4 mg daily  Iron deficiency-iron level is improved and is actually elevated at 193 normal being between 50 and 180.  Ferritin is normal.

## 2020-03-27 LAB — PSA: PSA: 2.37 ng/mL (ref <=4.0)

## 2020-03-27 NOTE — Progress Notes (Signed)
Called results to patient and faxed results to [facility] [phone]

## 2020-04-16 NOTE — Patient Instructions (Signed)
Cough will be observed for now.  Chest x-ray is negative.  Continue Protonix.  Repeat PSA has improved to 2.37.  Regarding BPH symptoms patient is being started on Flomax 0.4 mg daily.

## 2020-04-23 ENCOUNTER — Other Ambulatory Visit: Payer: Self-pay | Admitting: Internal Medicine

## 2020-05-18 ENCOUNTER — Encounter: Payer: Self-pay | Admitting: Orthopaedic Surgery

## 2020-05-18 ENCOUNTER — Ambulatory Visit (INDEPENDENT_AMBULATORY_CARE_PROVIDER_SITE_OTHER): Payer: Medicare Other

## 2020-05-18 ENCOUNTER — Ambulatory Visit (INDEPENDENT_AMBULATORY_CARE_PROVIDER_SITE_OTHER): Payer: Medicare Other | Admitting: Orthopaedic Surgery

## 2020-05-18 DIAGNOSIS — M25552 Pain in left hip: Secondary | ICD-10-CM | POA: Diagnosis not present

## 2020-05-18 DIAGNOSIS — M1712 Unilateral primary osteoarthritis, left knee: Secondary | ICD-10-CM | POA: Diagnosis not present

## 2020-05-18 NOTE — Progress Notes (Signed)
Office Visit Note   Patient: James [name]           Date of Birth: 02-20-1955           MRN: [id number] Visit Date: 05/18/2020              Requested by: Elby Showers, MD [address] [place],  [place] [id number] PCP: Elby Showers, MD   Assessment & Plan: Visit Diagnoses:  1. Pain in left hip   2. Unilateral primary osteoarthritis, left knee     Plan: As of now his pain is only sporadic with his left hip.  He cannot have steroid injections due to a retina issue.  I would hold off on any other intervention for his left hip until his hip started bothering him enough and we would consider an MRI to assess the cartilage better if needed.  From a knee standpoint, I did give him a handout about hyaluronic acid which could certainly be tried for an arthritic knee since he cannot have steroids.  All questions and concerns were answered and addressed.  I gave him a handout about hyaluronic acid and he will let us know.  I recommended continued activity modification and quad strengthening exercises.  Follow-Up Instructions: Return if symptoms worsen or fail to improve.   Orders:  Orders Placed This Encounter  Procedures  . XR HIP UNILAT W OR W/O PELVIS 1V LEFT   No orders of the defined types were placed in this encounter.     Procedures: No procedures performed   Clinical Data: No additional findings.   Subjective: Chief Complaint  Patient presents with  . Left Hip - Pain  The patient is someone I have seen but is spending quite some time.  I replaced his right hip in 2016.  He has been having some left hip pain but more lateral aspect of his hip.  He is also dealt with left knee pain.  In 2019 we did arthroscopic surgery on the left knee and did find significant arthritic changes in that knee as well as a complex meniscal tear.  He denies any pain in the groin.  He has been taking care of his wife who has stage IV lung cancer.  HPI  Review of Systems He  currently denies any headache, chest pain, shortness of breath, fever, chills, nausea, vomiting  Objective: Vital Signs: There were no vitals taken for this visit.  Physical Exam He is alert and orient x3 and in no acute distress Ortho Exam Examination of his left hip shows it moves smoothly and fluidly with no pain in the groin at all.  There is minimal pain over the trochanteric area.  His left knee does not have a significant effusion but does have medial lateral joint line tenderness and patellofemoral crepitation. Specialty Comments:  No specialty comments available.  Imaging: XR HIP UNILAT W OR W/O PELVIS 1V LEFT  Result Date: 05/18/2020 An AP pelvis and lateral left hip shows no acute findings.  There are some mild arthritic changes that have not changed when compared to films from 2016.  I did independent review of CT scan from November showing his pelvis and abdomen.  The left hip was evaluated and there was a cystic change in the acetabulum as well as the anterior superior femoral neck area that was well-corticated.  There was not significant arthritic changes in the joint itself.  PMFS History: Patient Active Problem List   Diagnosis Date Noted  .  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 Macular pucker, right eye 03/28/2019  . Status post arthroscopic knee surgery 08/09/2017  . Unilateral primary osteoarthritis, left knee 04/10/2017  . Status post arthroscopy of left knee 03/23/2017  . Barrett's esophagus 05/30/2016  . OSA (obstructive sleep apnea) 12/23/2015  . Exertional dyspnea 12/23/2015  . 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 Status post total replacement of right hip 03/14/2014  . Osteoarthritis of right hip 03/04/2014  . Allergic rhinitis 03/04/2014  . History of adenomatous polyp of colon 08/19/2011  . Left sided colitis ([place]) 08/19/2011  . GE reflux 11/14/2010  . Low back pain 11/14/2010  . History of smoking 11/14/2010  . Hyperlipidemia  11/14/2010   Past Medical History:  Diagnosis Date  . Anemia   . Aortic atherosclerosis ([place])   . Colitis   . Degenerative disc disease   . Diverticulosis 2013   Colonoscopy  . GERD (gastroesophageal reflux disease)   . Hiatal hernia   . Hx of colonic polyps 2013   Colonoscopy- tubular adenoma, and hyperplastic   . Hyperlipidemia   . Internal hemorrhoids 2013   Colonoscopy  . Lower back pain   . Right inguinal hernia   . Sleep apnea 2015   on CPAP  . Tubular adenoma     Family History  Problem Relation Age of Onset  . Heart disease Father   . Colon cancer Father 56  . Esophageal cancer Father        late 36's  . Colon cancer Mother        late 38s  . Stomach cancer Neg Hx   . AAA (abdominal aortic aneurysm) Neg Hx   . Rectal cancer Neg Hx     Past Surgical History:  Procedure Laterality Date  . COLONOSCOPY    . HERNIA REPAIR  1980   left inguinal  . TOTAL HIP ARTHROPLASTY Right 03/14/2014   Procedure: RIGHT TOTAL HIP ARTHROPLASTY ANTERIOR APPROACH ;  Surgeon: Mcarthur Rossetti, MD;  Location: [facility];  Service: Orthopedics;  Laterality: Right;  . WISDOM TOOTH EXTRACTION     Social History   Occupational History  . Occupation: Academic librarian  Tobacco Use  . Smoking status: Former Smoker    Packs/day: 0.25    Years: 30.00    Pack years: 7.50    Types: Cigarettes    Quit date: 03/09/2014    Years since quitting: 6.1  . Smokeless tobacco: Never Used  . Tobacco comment: Counseling sheet given 08-2011  Vaping Use  . Vaping Use: Never used  Substance and Sexual Activity  . Alcohol use: Yes    Alcohol/week: 7.0 standard drinks    Types: 7 Standard drinks or equivalent per week    Comment: week  . Drug use: No  . Sexual activity: Not on file

## 2020-06-02 ENCOUNTER — Ambulatory Visit: Payer: Medicare Other | Admitting: Gastroenterology

## 2020-06-22 ENCOUNTER — Other Ambulatory Visit: Payer: Self-pay

## 2020-06-22 NOTE — Telephone Encounter (Signed)
Need RX sent to [facility] on [address].

## 2020-06-25 MED ORDER — ALPRAZOLAM 0.25 MG PO TABS: 0.2500 mg | ORAL_TABLET | Freq: Two times a day (BID) | ORAL | 0 refills | Status: DC | PRN

## 2020-06-25 NOTE — Telephone Encounter (Signed)
Pended, needs by end of week. Please sign.

## 2020-08-05 ENCOUNTER — Telehealth: Payer: Self-pay | Admitting: Internal Medicine

## 2020-08-05 MED ORDER — MESALAMINE 1.2 G PO TBEC: 4.8000 g | DELAYED_RELEASE_TABLET | Freq: Every day | ORAL | 0 refills | Status: DC

## 2020-08-05 NOTE — Telephone Encounter (Signed)
Patient requesting Mesalamine refill

## 2020-08-05 NOTE — Telephone Encounter (Signed)
I have spoken to patient to tell him that he needs office visit (he was supposed to have a 3 month follow up with Dr Hilarie Fredrickson in April 2022). He unfortunately lost track of his appointments due to his wife having cancer and undergoing chemo. I have scheduled patient for Dr Garth Schlatter next available opening which is on 11/10/20. Patient verbalizes understanding of this information and agrees with plan. Mesalamine refills sent until 11/10/20 appointment.

## 2020-08-31 ENCOUNTER — Encounter: Payer: Self-pay | Admitting: Internal Medicine

## 2020-09-15 ENCOUNTER — Other Ambulatory Visit: Payer: Self-pay | Admitting: Internal Medicine

## 2020-09-29 ENCOUNTER — Other Ambulatory Visit: Payer: Medicare Other | Admitting: Internal Medicine

## 2020-09-29 ENCOUNTER — Other Ambulatory Visit: Payer: Self-pay

## 2020-09-29 DIAGNOSIS — Z0000 Encounter for general adult medical examination without abnormal findings: Secondary | ICD-10-CM

## 2020-09-29 DIAGNOSIS — N401 Enlarged prostate with lower urinary tract symptoms: Secondary | ICD-10-CM

## 2020-09-29 DIAGNOSIS — R972 Elevated prostate specific antigen [PSA]: Secondary | ICD-10-CM

## 2020-09-29 DIAGNOSIS — E7800 Pure hypercholesterolemia, unspecified: Secondary | ICD-10-CM

## 2020-09-30 LAB — CBC WITH DIFFERENTIAL/PLATELET
Absolute Monocytes: 480 cells/uL (ref 200–950)
Basophils Absolute: 20 cells/uL (ref 0–200)
Basophils Relative: 0.4 %
Eosinophils Absolute: 120 cells/uL (ref 15–500)
Eosinophils Relative: 2.4 %
HCT: 44.1 % (ref 38.5–50.0)
Hemoglobin: 14.5 g/dL (ref 13.2–17.1)
Lymphs Abs: 1440 cells/uL (ref 850–3900)
MCH: 31.9 pg (ref 27.0–33.0)
MCHC: 32.9 g/dL (ref 32.0–36.0)
MCV: 96.9 fL (ref 80.0–100.0)
MPV: 12.4 fL (ref 7.5–12.5)
Monocytes Relative: 9.6 %
Neutro Abs: 2940 cells/uL (ref 1500–7800)
Neutrophils Relative %: 58.8 %
Platelets: 283 10*3/uL (ref 140–400)
RBC: 4.55 10*6/uL (ref 4.20–5.80)
RDW: 11.8 % (ref 11.0–15.0)
Total Lymphocyte: 28.8 %
WBC: 5 10*3/uL (ref 3.8–10.8)

## 2020-09-30 LAB — COMPLETE METABOLIC PANEL WITH GFR
AG Ratio: 1.8 (calc) (ref 1.0–2.5)
ALT: 10 U/L (ref 9–46)
AST: 19 U/L (ref 10–35)
Albumin: 4.4 g/dL (ref 3.6–5.1)
Alkaline phosphatase (APISO): 41 U/L (ref 35–144)
BUN: 14 mg/dL (ref 7–25)
CO2: 28 mmol/L (ref 20–32)
Calcium: 10 mg/dL (ref 8.6–10.3)
Chloride: 103 mmol/L (ref 98–110)
Creat: 0.87 mg/dL (ref 0.70–1.35)
Globulin: 2.4 g/dL (calc) (ref 1.9–3.7)
Glucose, Bld: 97 mg/dL (ref 65–99)
Potassium: 4.7 mmol/L (ref 3.5–5.3)
Sodium: 139 mmol/L (ref 135–146)
Total Bilirubin: 0.4 mg/dL (ref 0.2–1.2)
Total Protein: 6.8 g/dL (ref 6.1–8.1)
eGFR: 95 mL/min/{1.73_m2} (ref >=60)

## 2020-09-30 LAB — LIPID PANEL
Cholesterol: 190 mg/dL (ref <200)
HDL: 89 mg/dL (ref >=40)
LDL Cholesterol (Calc): 86 mg/dL (calc)
Non-HDL Cholesterol (Calc): 101 mg/dL (calc) (ref <130)
Total CHOL/HDL Ratio: 2.1 (calc) (ref <5.0)
Triglycerides: 64 mg/dL (ref <150)

## 2020-09-30 LAB — PSA: PSA: 1.53 ng/mL (ref <=4.00)

## 2020-09-30 LAB — TSH: TSH: 1.71 mIU/L (ref 0.40–4.50)

## 2020-10-01 NOTE — Progress Notes (Signed)
I connected with  James [name] on 10/02/20 by an audio only telemedicine application and verified that I am speaking with the correct person using two identifiers.   I discussed the limitations, risks, security and privacy concerns of performing an evaluation and management service by telephone and the availability of in person appointments. I also discussed with the patient that there may be a patient responsible charge related to this service. The patient expressed understanding and verbally consented to this telephonic visit.  Location of Patient: [place] of Provider: office  Persons participating in visit: James [name] and Elizabeth Palau, [place].   Subjective:   James [name] is a 65 y.o. male who presents for Medicare Annual/Subsequent preventive examination.  Review of Systems    Defer to PCP       Objective:    Today's Vitals   10/02/20 1111  BP: (!) 144/84  Pulse: 73  SpO2: 96%  Weight: 194 lb (88 kg)  Height: 5' 11"  (1.803 m)   Body mass index is 27.06 kg/m.  Advanced Directives 05/24/2016 03/14/2014 03/14/2014 03/06/2014  Does Patient Have a Medical Advance Directive? Yes Yes Yes Yes  Type of Paramedic of [place];Living will Healthcare Power of [place] of [place]  Does patient want to make changes to medical advance directive? - No - Patient declined No - Patient declined -  Copy of [place] in Chart? No - copy requested Yes Yes Yes    Current Medications (verified) Outpatient Encounter Medications as of 10/02/2020  Medication Sig   ALPRAZolam (XANAX) 0.25 MG tablet Take 1 tablet (0.25 mg total) by mouth 2 (two) times daily as needed for anxiety.   Ferrous Sulfate (IRON PO) Take 65 mg by mouth 2 (two) times daily.   Iron, Ferrous Sulfate, 325 (65 Fe) MG TABS Take 325 mg by mouth 2 (two) times daily.   mesalamine (LIALDA) 1.2 g EC tablet Take 4 tablets (4.8 g  total) by mouth daily with breakfast. KEEP 11/10/20 APPT FOR FURTHER REFILLS   Multiple Vitamin (MULTIVITAMIN) tablet Take 1 tablet by mouth daily.   pantoprazole (PROTONIX) 40 MG tablet TAKE 1 TABLET BY MOUTH ONCE DAILY   polyethylene glycol powder (GLYCOLAX/MIRALAX) 17 GM/SCOOP powder Take 255 g by mouth 2 (two) times daily.   rosuvastatin (CRESTOR) 5 MG tablet TAKE 1 TABLET BY MOUTH EVERY DAY   tamsulosin (FLOMAX) 0.4 MG CAPS capsule Take 1 capsule (0.4 mg total) by mouth daily.   No facility-administered encounter medications on file as of 10/02/2020.    Allergies (verified) Patient has no known allergies.   History: Past Medical History:  Diagnosis Date   Anemia    Aortic atherosclerosis ([place])    Colitis    Degenerative disc disease    Diverticulosis 2013   Colonoscopy   GERD (gastroesophageal reflux disease)    Hiatal hernia    Hx of colonic polyps 2013   Colonoscopy- tubular adenoma, and hyperplastic    Hyperlipidemia    Internal hemorrhoids 2013   Colonoscopy   Lower back pain    Right inguinal hernia    Sleep apnea 2015   on CPAP   Tubular adenoma    Past Surgical History:  Procedure Laterality Date   COLONOSCOPY     HERNIA REPAIR  1980   left inguinal   TOTAL HIP ARTHROPLASTY Right 03/14/2014   Procedure: RIGHT TOTAL HIP ARTHROPLASTY ANTERIOR APPROACH ;  Surgeon: Mcarthur Rossetti,  MD;  Location: [facility];  Service: Orthopedics;  Laterality: Right;   WISDOM TOOTH EXTRACTION     Family History  Problem Relation Age of Onset   Heart disease Father    Colon cancer Father 59   Esophageal cancer Father        late 61's   Colon cancer Mother        late 71s   Stomach cancer Neg Hx    AAA (abdominal aortic aneurysm) Neg Hx    Rectal cancer Neg Hx    Social History   Socioeconomic History   Marital status: Married    Spouse name: Not on file   Number of children: 5   Years of education: Not on file   Highest education level: Not on file  Occupational  History   Occupation: Academic librarian  Tobacco Use   Smoking status: Former    Packs/day: 0.25    Years: 30.00    Pack years: 7.50    Types: Cigarettes    Quit date: 03/09/2014    Years since quitting: 6.5   Smokeless tobacco: Never   Tobacco comments:    Counseling sheet given 08-2011  Vaping Use   Vaping Use: Never used  Substance and Sexual Activity   Alcohol use: Yes    Alcohol/week: 7.0 standard drinks    Types: 7 Standard drinks or equivalent per week    Comment: week   Drug use: No   Sexual activity: Not on file  Other Topics Concern   Not on file  Social History Narrative   Not on file   Social Determinants of Health   Financial Resource Strain: Low Risk    Difficulty of Paying Living Expenses: Not hard at all  Food Insecurity: No Food Insecurity   Worried About Charity fundraiser in the Last Year: Never true   [place] in the Last Year: Never true  Transportation Needs: No Transportation Needs   Lack of Transportation (Medical): No   Lack of Transportation (Non-Medical): No  Physical Activity: Insufficiently Active   Days of Exercise per Week: 2 days   Minutes of Exercise per Session: 30 min  Stress: Stress Concern Present   Feeling of Stress : To some extent  Social Connections: Moderately Integrated   Frequency of Communication with Friends and Family: More than three times a week   Frequency of Social Gatherings with Friends and Family: Once a week   Attends Religious Services: More than 4 times per year   Active Member of [employer] or Organizations: No   Attends Archivist Meetings: Never   Marital Status: Married    Tobacco Counseling Counseling given: Not Answered Tobacco comments: Counseling sheet given 08-2011   Clinical Intake:  Pre-visit preparation completed: Yes              Diabetic?No         Activities of Daily Living In your present state of health, do you have any difficulty performing the following activities:  10/02/2020  Hearing? N  Vision? N  Difficulty concentrating or making decisions? N  Walking or climbing stairs? N  Dressing or bathing? N  Doing errands, shopping? N  Some recent data might be hidden    Patient Care Team: Baxley, Cresenciano Lick, MD as PCP - General (Internal Medicine)  Indicate any recent Medical Services you may have received from other than [facility] providers in the past year (date may be approximate).     Assessment:  This is a routine wellness examination for [place].  Hearing/Vision screen No results found.  Dietary issues and exercise activities discussed:     Goals Addressed   None    Depression Screen PHQ 2/9 Scores 10/02/2020 08/19/2019 07/12/2019 04/07/2017  PHQ - 2 Score 1 0 0 0  PHQ- 9 Score 3 - - -    Fall Risk Fall Risk  10/02/2020 08/19/2019 07/12/2019 05/30/2016  Falls in the past year? 0 1 0 No  Number falls in past yr: 0 1 0 -  Injury with Fall? 0 1 0 -  Risk for fall due to : - Other (Comment) - -  Follow up - Falls evaluation completed Falls evaluation completed -    FALL RISK PREVENTION PERTAINING TO THE HOME:  Any stairs in or around the home? Yes  If so, are there any without handrails? Yes  Home free of loose throw rugs in walkways, pet beds, electrical cords, etc? No  Adequate lighting in your home to reduce risk of falls? Yes   ASSISTIVE DEVICES UTILIZED TO PREVENT FALLS:  Life alert? No  Use of a cane, walker or w/c? No  Grab bars in the bathroom? No  Shower chair or bench in shower? No  Elevated toilet seat or a handicapped toilet? No   TIMED UP AND GO:  Was the test performed? No .  Length of time to ambulate 10 feet: n/a sec.     Cognitive Function:     6CIT Screen 10/02/2020  What Year? 0 points  What month? 0 points  What time? 0 points  Count back from 20 0 points  Months in reverse 0 points  Repeat phrase 10 points  Total Score 10    Immunizations Immunization History  Administered Date(s) Administered   DTaP  05/13/2011   Hep A / Hep B 10/05/2016, 11/09/2016, 04/04/2017   Influenza Whole 10/17/2005   Influenza,inj,Quad PF,6+ Mos 09/20/2012, 12/03/2013, 01/08/2015, 01/04/2016, 10/05/2016, 09/28/2017, 10/03/2019   Influenza-Unspecified 11/15/2016   Moderna Sars-Covid-2 Vaccination 03/21/2019, 04/23/2019, 01/08/2020   Pneumococcal Conjugate-13 10/05/2016, 08/19/2019   Tdap 12/22/1995, 01/25/2019   Zoster, Live 04/18/2014    TDAP status: Up to date  Flu Vaccine status: Completed at today's visit  Pneumococcal vaccine status: Due, Education has been provided regarding the importance of this vaccine. Advised may receive this vaccine at local pharmacy or Health Dept. Aware to provide a copy of the vaccination record if obtained from local pharmacy or Health Dept. Verbalized acceptance and understanding.  Covid-19 vaccine status: Completed vaccines  Qualifies for Shingles Vaccine? Yes   Zostavax completed No   Shingrix Completed?: No.    Education has been provided regarding the importance of this vaccine. Patient has been advised to call insurance company to determine out of pocket expense if they have not yet received this vaccine. Advised may also receive vaccine at local pharmacy or Health Dept. Verbalized acceptance and understanding.  Screening Tests Health Maintenance  Topic Date Due   INFLUENZA VACCINE  08/17/2020   COVID-19 Vaccine (4 - Booster for Moderna series) 09/29/2021 (Originally 04/01/2020)   PNA vac Low Risk Adult (2 of 2 - PPSV23) 10/05/2021 (Originally 08/18/2020)   Zoster Vaccines- Shingrix (1 of 2) 10/07/2021 (Originally 06/06/1973)   COLONOSCOPY (Pts 45-75yr Insurance coverage will need to be confirmed)  09/30/2022   TETANUS/TDAP  01/24/2029   Hepatitis C Screening  Completed   HPV VACCINES  Aged Out    Health Maintenance  Health Maintenance Due  Topic Date Due  INFLUENZA VACCINE  08/17/2020    Colorectal cancer screening: Type of screening: Colonoscopy. Completed  09/30/2019. Repeat every 3 years  Lung Cancer Screening: (Low Dose CT Chest recommended if Age 31-80 years, 30 pack-year currently smoking OR have quit w/in 15years.) does qualify.   Lung Cancer Screening Referral: order placed  Additional Screening:  Hepatitis C Screening: does not qualify; Completed 09/2016  Vision Screening: Recommended annual ophthalmology exams for early detection of glaucoma and other disorders of the eye. Is the patient up to date with their annual eye exam?  Yes  Who is the provider or what is the name of the office in which the patient attends annual eye exams? [facility] If pt is not established with a provider, would they like to be referred to a provider to establish care? No .   Dental Screening: Recommended annual dental exams for proper oral hygiene  Community Resource Referral / Chronic Care Management: CRR required this visit?  Yes   CCM required this visit?  No      Plan:     I have personally reviewed and noted the following in the patient's chart:   Medical and social history Use of alcohol, tobacco or illicit drugs  Current medications and supplements including opioid prescriptions. Patient is not currently taking opioid prescriptions. Functional ability and status Nutritional status Physical activity Advanced directives List of other physicians Hospitalizations, surgeries, and ER visits in previous 12 months Vitals Screenings to include cognitive, depression, and falls Referrals and appointments  In addition, I have reviewed and discussed with patient certain preventive protocols, quality metrics, and best practice recommendations. A written personalized care plan for preventive services as well as general preventive health recommendations were provided to patient.     Amado Coe, [place]   10/02/2020   Nurse Notes: face to face 25 minutes visit.

## 2020-10-01 NOTE — Patient Instructions (Addendum)
Health Maintenance, Male Adopting a healthy lifestyle and getting preventive care are important in promoting health and wellness. Ask your health care provider about: The right schedule for you to have regular tests and exams. Things you can do on your own to prevent diseases and keep yourself healthy. What should I know about diet, weight, and exercise? Eat a healthy diet  Eat a diet that includes plenty of vegetables, fruits, low-fat dairy products, and lean protein. Do not eat a lot of foods that are high in solid fats, added sugars, or sodium. Maintain a healthy weight Body mass index (BMI) is a measurement that can be used to identify possible weight problems. It estimates body fat based on height and weight. Your health care provider can help determine your BMI and help you achieve or maintain a healthy weight. Get regular exercise Get regular exercise. This is one of the most important things you can do for your health. Most adults should: Exercise for at least 150 minutes each week. The exercise should increase your heart rate and make you sweat (moderate-intensity exercise). Do strengthening exercises at least twice a week. This is in addition to the moderate-intensity exercise. Spend less time sitting. Even light physical activity can be beneficial. Watch cholesterol and blood lipids Have your blood tested for lipids and cholesterol at 66 years of age, then have this test every 5 years. You may need to have your cholesterol levels checked more often if: Your lipid or cholesterol levels are high. You are older than 66 years of age. You are at high risk for heart disease. What should I know about cancer screening? Many types of cancers can be detected early and may often be prevented. Depending on your health history and family history, you may need to have cancer screening at various ages. This may include screening for: Colorectal cancer. Prostate cancer. Skin cancer. Lung  cancer. What should I know about heart disease, diabetes, and high blood pressure? Blood pressure and heart disease High blood pressure causes heart disease and increases the risk of stroke. This is more likely to develop in people who have high blood pressure readings, are of African descent, or are overweight. Talk with your health care provider about your target blood pressure readings. Have your blood pressure checked: Every 3-5 years if you are 36-73 years of age. Every year if you are 66 years old or older. If you are between the ages of 43 and 57 and are a current or former smoker, ask your health care provider if you should have a one-time screening for abdominal aortic aneurysm (AAA). Diabetes Have regular diabetes screenings. This checks your fasting blood sugar level. Have the screening done: Once every three years after age 38 if you are at a normal weight and have a low risk for diabetes. More often and at a younger age if you are overweight or have a high risk for diabetes. What should I know about preventing infection? Hepatitis B If you have a higher risk for hepatitis B, you should be screened for this virus. Talk with your health care provider to find out if you are at risk for hepatitis B infection. Hepatitis C Blood testing is recommended for: Everyone born from [birth year] through 1966. Anyone with known risk factors for hepatitis C. Sexually transmitted infections (STIs) You should be screened each year for STIs, including gonorrhea and chlamydia, if: You are sexually active and are younger than 66 years of age. You are older than 66 years  of age and your health care provider tells you that you are at risk for this type of infection. Your sexual activity has changed since you were last screened, and you are at increased risk for chlamydia or gonorrhea. Ask your health care provider if you are at risk. Ask your health care provider about whether you are at high risk for HIV.  Your health care provider may recommend a prescription medicine to help prevent HIV infection. If you choose to take medicine to prevent HIV, you should first get tested for HIV. You should then be tested every 3 months for as long as you are taking the medicine. Follow these instructions at home: Lifestyle Do not use any products that contain nicotine or tobacco, such as cigarettes, e-cigarettes, and chewing tobacco. If you need help quitting, ask your health care provider. Do not use street drugs. Do not share needles. Ask your health care provider for help if you need support or information about quitting drugs. Alcohol use Do not drink alcohol if your health care provider tells you not to drink. If you drink alcohol: Limit how much you have to 0-2 drinks a day. Be aware of how much alcohol is in your drink. In the [place] one drink equals one 12 oz bottle of beer (355 mL), one 5 oz glass of wine (148 mL), or one 1 oz glass of hard liquor (44 mL). General instructions  Have COVID booster and flu vaccine this Fall.  Continue current medications and return in 1 year or as needed.  Continue follow-up with gastroenterologist and urologist.  Continue Xanax for anxiety. Schedule regular health, dental, and eye exams. Stay current with your vaccines. Tell your health care provider if: You often feel depressed. You have ever been abused or do not feel safe at home. Summary Adopting a healthy lifestyle and getting preventive care are important in promoting health and wellness. Follow your health care provider's instructions about healthy diet, exercising, and getting tested or screened for diseases. Follow your health care provider's instructions on monitoring your cholesterol and blood pressure. This information is not intended to replace advice given to you by your health care provider. Make sure you discuss any questions you have with your health care provider. Document Revised: 03/13/2020  Document Reviewed: 12/27/2017 Elsevier Patient Education  2022 [employer].

## 2020-10-02 ENCOUNTER — Encounter: Payer: Self-pay | Admitting: Internal Medicine

## 2020-10-02 ENCOUNTER — Other Ambulatory Visit: Payer: Self-pay

## 2020-10-02 ENCOUNTER — Ambulatory Visit (INDEPENDENT_AMBULATORY_CARE_PROVIDER_SITE_OTHER): Payer: Medicare Other | Admitting: Internal Medicine

## 2020-10-02 VITALS — BP 144/84 | HR 73 | Ht 71.0 in | Wt 194.0 lb

## 2020-10-02 DIAGNOSIS — Z96641 Presence of right artificial hip joint: Secondary | ICD-10-CM

## 2020-10-02 DIAGNOSIS — K51318 Ulcerative (chronic) rectosigmoiditis with other complication: Secondary | ICD-10-CM

## 2020-10-02 DIAGNOSIS — Z87898 Personal history of other specified conditions: Secondary | ICD-10-CM | POA: Diagnosis not present

## 2020-10-02 DIAGNOSIS — F439 Reaction to severe stress, unspecified: Secondary | ICD-10-CM | POA: Diagnosis not present

## 2020-10-02 DIAGNOSIS — R350 Frequency of micturition: Secondary | ICD-10-CM

## 2020-10-02 DIAGNOSIS — N401 Enlarged prostate with lower urinary tract symptoms: Secondary | ICD-10-CM | POA: Diagnosis not present

## 2020-10-02 DIAGNOSIS — Z0000 Encounter for general adult medical examination without abnormal findings: Secondary | ICD-10-CM | POA: Diagnosis not present

## 2020-10-02 DIAGNOSIS — F411 Generalized anxiety disorder: Secondary | ICD-10-CM | POA: Diagnosis not present

## 2020-10-02 DIAGNOSIS — G4733 Obstructive sleep apnea (adult) (pediatric): Secondary | ICD-10-CM

## 2020-10-02 DIAGNOSIS — E7800 Pure hypercholesterolemia, unspecified: Secondary | ICD-10-CM

## 2020-10-02 DIAGNOSIS — K2100 Gastro-esophageal reflux disease with esophagitis, without bleeding: Secondary | ICD-10-CM

## 2020-10-02 LAB — POCT URINALYSIS DIPSTICK
Bilirubin, UA: NEGATIVE
Blood, UA: NEGATIVE
Glucose, UA: NEGATIVE
Ketones, UA: NEGATIVE
Leukocytes, UA: NEGATIVE
Nitrite, UA: NEGATIVE
Protein, UA: NEGATIVE
Spec Grav, UA: 1.015 (ref 1.010–1.025)
Urobilinogen, UA: 0.2 E.U./dL
pH, UA: 5.5 (ref 5.0–8.0)

## 2020-10-06 ENCOUNTER — Telehealth: Payer: Self-pay | Admitting: *Deleted

## 2020-10-06 NOTE — Telephone Encounter (Signed)
   Telephone encounter was:  Successful.  10/06/2020 Name: GRAVIEL PAYEUR MRN: [id number] DOB: 08-24-1955  [name] is a 65 y.o. year old male who is a primary care patient of Baxley, Cresenciano Lick, MD . The community resource team was consulted for assistance with Called work and cell , grief couseling information , patient thought best provided through email , will gather this information and send to his email  Care guide performed the following interventions: Patient provided with information about care guide support team and interviewed to confirm resource needs Follow up call placed to community resources to determine status of patients referral.  Follow Up Plan:  No further follow up planned at this time. The patient has been provided with needed resources.  [place], Care Management  [phone] [address] , [place] [address] Email : [email or web address]

## 2020-10-07 ENCOUNTER — Other Ambulatory Visit: Payer: Self-pay | Admitting: Internal Medicine

## 2020-10-22 ENCOUNTER — Other Ambulatory Visit: Payer: Self-pay | Admitting: Internal Medicine

## 2020-11-05 ENCOUNTER — Ambulatory Visit: Payer: Medicare Other | Admitting: Internal Medicine

## 2020-11-08 NOTE — Progress Notes (Signed)
Subjective:    Patient ID: James [name], male    DOB: 1955/03/17, 65 y.o.   MRN: [id number]  HPI 65 year old Male seen for Medicare wellness exam and evaluation of medical issues.  Currently his wife is being treated for metastatic lung cancer.  He has considerable situational stress.  He takes Xanax for anxiety.  He has a history of inflammatory bowel disease (left-sided ulcerative colitis) diagnosed in 2018 and followed by gastroenterologist.  Last colonoscopy was in 2018.  He has a history of Barrett's esophagus without dysplasia diagnosed in 2018.  History of GE reflux.  History of sessile serrated adenomatous colon polyps.  For inflammatory bowel disease he takes Lialda daily. For hyperlipidemia he is treated with low-dose Crestor 5 mg daily.  He takes Protonix for GE reflux.  He takes Flomax for BPH.  History of snoring and sleep apnea.  He has CPAP device.  He has had right scapular discomfort in the past but has not complained of that recently.  He had an MRI on that area which did not show an abnormality but has a snapping of his scapula with certain motions.  History of osteoarthritis of right hip and is status post right hip arthroplasty by Dr. Ninfa Linden in February 2016.  He had left medial meniscectomy in 2019.  He cannot have steroid injections due to optical issues with his eyes.  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 Tressia Danas at [facility] in [place].  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  Fractured left forearm 1962, fractured right ankle 1971, left inguinal herniorrhaphy 1995, history of internal hemorrhoids.  No known drug allergies.  History of low back pain.  History of meralgia paresthetica of right leg.  He saw Dr. Joya Salm many years ago and was told he had degenerative disc disease of the spine.  Social history: He is married.  Completed 2 years of college.  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  They live near [place].  Family history: Father died at age 64 with history of coronary artery disease, esophageal and colon cancer.  Mother with history of stroke and colon cancer.  Mother is deceased.  2 sisters in good health.    Review of Systems situational stress with wife whose health is fragile.  They were able to travel with their friends overseas and had a good time.     Objective:   Physical Exam Blood pressure 144/84 and bears watching pulse 73 pulse oximetry 96% weight 194 pounds height 5 feet 11 inches BMI 27.06  Skin: Warm and dry.  No cervical adenopathy.  No thyromegaly.  No carotid bruits.  Chest is clear to auscultation.  Cardiac exam regular rate and rhythm.  Abdomen no hepatosplenomegaly masses or tenderness.  Neuro is intact without focal deficits.       Assessment & Plan:   History of elevated PSA-followed by Urology.  Was seen in March and PSA had normalized.  He was also found to have new microhematuria and was treated with Keflex.  A urine culture was ordered but somehow was not done.  Urine specimen was repeated April 18 and was normal.  Inflammatory bowel disease followed by Dr. Hilarie Fredrickson and currently stable  Situational stress and anxiety state with wife's illness and he takes Xanax sparingly  History of sleep apnea-has CPAP device  History of right hip arthroplasty  GE reflux treated with PPI  Pure hypercholesterolemia treated with Crestor 5 mg daily.  Hemoglobin is normal at 14.5 g.  Has been as low as 11.1 g in 2021.  Plan: Patient advised to have COVID booster and annual flu vaccine.  Tetanus immunization is up-to-date.  Return in 1 year or as needed.

## 2020-11-09 ENCOUNTER — Ambulatory Visit (INDEPENDENT_AMBULATORY_CARE_PROVIDER_SITE_OTHER): Payer: Medicare Other | Admitting: Orthopaedic Surgery

## 2020-11-09 DIAGNOSIS — M25552 Pain in left hip: Secondary | ICD-10-CM

## 2020-11-09 NOTE — Progress Notes (Signed)
James [name] is well-known to me.  I been seeing him for some time now as relates to left hip pain.  Actually replaced his right hip remotely.  His left hip used to hurt just on the lateral aspect of his hip.  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  He is now experiencing pain in the sciatic region but more in the groin on his left hip.  He has been having some continued left knee pain as well.  We have treated him for left hip pain now for well over 6 months.  He is a thin individual.  He is worked on activity modification and taking anti-inflammatories.  He is worked on hip strengthening as well.  His left hip now hurts in the groin with internal and external rotation is very painful when I put him through those motions.  I reviewed the x-rays from previous that showed joint space narrowing but not significant.  Based on his clinical exam, it is necessary to obtain an MRI of the left hip to further assess the cartilage so we can come up with a better treatment plan for him given his worsening pain and mechanical symptoms with his left hip.  We will see him back in follow-up after we have the MRI of his left hip.

## 2020-11-10 ENCOUNTER — Encounter: Payer: Self-pay | Admitting: Internal Medicine

## 2020-11-10 ENCOUNTER — Other Ambulatory Visit: Payer: Self-pay

## 2020-11-10 ENCOUNTER — Ambulatory Visit (INDEPENDENT_AMBULATORY_CARE_PROVIDER_SITE_OTHER): Payer: Medicare Other | Admitting: Internal Medicine

## 2020-11-10 VITALS — BP 130/82 | HR 70 | Ht 71.0 in | Wt 196.0 lb

## 2020-11-10 DIAGNOSIS — K5150 Left sided colitis without complications: Secondary | ICD-10-CM

## 2020-11-10 DIAGNOSIS — M25552 Pain in left hip: Secondary | ICD-10-CM

## 2020-11-10 DIAGNOSIS — Z862 Personal history of diseases of the blood and blood-forming organs and certain disorders involving the immune mechanism: Secondary | ICD-10-CM

## 2020-11-10 DIAGNOSIS — K219 Gastro-esophageal reflux disease without esophagitis: Secondary | ICD-10-CM | POA: Diagnosis not present

## 2020-11-10 DIAGNOSIS — K2270 Barrett's esophagus without dysplasia: Secondary | ICD-10-CM

## 2020-11-10 MED ORDER — MESALAMINE 1.2 G PO TBEC: 4.8000 g | DELAYED_RELEASE_TABLET | Freq: Every day | ORAL | 3 refills | Status: DC

## 2020-11-10 MED ORDER — PANTOPRAZOLE SODIUM 40 MG PO TBEC: 40.0000 mg | DELAYED_RELEASE_TABLET | Freq: Every day | ORAL | 3 refills | Status: DC

## 2020-11-10 NOTE — Progress Notes (Signed)
Subjective:    Patient ID: James [name], male    DOB: 1955-07-01, 65 y.o.   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  He is here alone today.  He was last seen by virtual visit in January 2022.  He reports he is doing well from a colitis standpoint.  He is maintained on Lialda 4.8 g daily.  We did treat him with Uceris for about 8 weeks.  He reports that his colitis symptoms have resolved.  He has no abdominal pain, no mucus in stool and no blood in stool.  He is continuing the MiraLAX 17 g daily.  He has a bowel movement daily usually soft.  He continues pantoprazole 40 mg daily without dysphagia symptom.  He will rarely have heartburn mostly if he overeats.  His retinopathy has advanced somewhat and he is now getting intraocular a Avastin treatments.  His wife continues her treatments for metastatic lung cancer.  This is a hard process for him but she has already lived longer than expected for which she is very thankful.   Review of Systems As per HPI, otherwise negative  Current Medications, Allergies, Past Medical History, Past Surgical History, Family History and Social History were reviewed in [employer] record.    Objective:   Physical Exam BP 130/82   Pulse 70   Ht 5' 11"  (1.803 m)   Wt 196 lb (88.9 kg)   SpO2 98%   BMI 27.34 kg/m  Gen: awake, alert, NAD HEENT: anicteric CV: RRR, no mrg Pulm: CTA b/l Abd: soft, NT/ND, +BS throughout Ext: no c/c/e Neuro: nonfocal  CMP     Component Value Date/Time   NA 139 09/29/2020 1000   K 4.7 09/29/2020 1000   CL 103 09/29/2020 1000   CO2 28 09/29/2020 1000   GLUCOSE 97 09/29/2020 1000   BUN 14 09/29/2020  1000   CREATININE 0.87 09/29/2020 1000   CALCIUM 10.0 09/29/2020 1000   PROT 6.8 09/29/2020 1000   ALBUMIN 3.8 07/24/2019 1238   AST 19 09/29/2020 1000   ALT 10 09/29/2020 1000   ALKPHOS 47 07/24/2019 1238   BILITOT 0.4 09/29/2020 1000   GFRNONAA 91 08/15/2019 1056   GFRAA 106 08/15/2019 1056   CBC    Component Value Date/Time   WBC 5.0 09/29/2020 1000   RBC 4.55 09/29/2020 1000   HGB 14.5 09/29/2020 1000   HCT 44.1 09/29/2020 1000   PLT 283 09/29/2020 1000   MCV 96.9 09/29/2020 1000   MCH 31.9 09/29/2020 1000   MCHC 32.9 09/29/2020 1000   RDW 11.8 09/29/2020 1000   LYMPHSABS 1,440 09/29/2020 1000   MONOABS 0.6 09/28/2016 1600   EOSABS 120 09/29/2020 1000   BASOSABS 20 09/29/2020 1000       Assessment & Plan:  65 year old male with a history of left-sided ulcerative colitis diagnosed in 2018, GERD with history of Barrett's esophagus without dysplasia, history of sessile serrated and adenomatous colon polyps, 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   Left-sided colitis (diagnosis 2018) --colitis is come under control and he has clinical remission on higher dose of mesalamine therapy.  He did do 8 weeks of budesonide but not in nearly  a year.  We discussed objective assessment of colitis control with fecal calprotectin versus colonoscopy.  He prefers colonoscopy.  This is very reasonable. --Continue Lialda 4.8 g daily --Colonoscopy to be scheduled in January; we reviewed the risk, benefits and alternatives and he is agreeable and wishes to proceed --He can continue with MiraLAX 17 g daily.  2.  GERD with Barrett's esophagus --very rare heartburn without dysphagia.  EGD recommended for surveillance.  Last exam was May 2018. --Continue pantoprazole 40 mg once daily --EGD at the same time as colonoscopy as above  3.  Mild constipation --continue MiraLAX 17 g daily  4.  IDA --previously related to #1; noted to have  resolved by CBC performed by primary care in September.  Hemoglobin is 14.5.  MCV 96.9.  Monitor hemoglobin routinely

## 2020-11-10 NOTE — Patient Instructions (Addendum)
It has been recommended to you by your physician that you have a(n) Colon/EGD in the [facility] completed. Per your request, we did not schedule the procedure(s) today because you would like to wait until January 2023. Please contact our office at [phone] in early December to schedule. You will be scheduled for a pre-visit and procedure at that time.  We have sent the following medications to your pharmacy for you to pick up at your convenience: Pantoprazole, Lialda   If you are age 2 or older, your body mass index should be between 23-30. Your Body mass index is 27.34 kg/m. If this is out of the aforementioned range listed, please consider follow up with your Primary Care Provider.  ________________________________________________________  The [facility] providers would like to encourage you to use [facility] to communicate with providers for non-urgent requests or questions.  Due to long hold times on the telephone, sending your provider a message by [facility] may be a faster and more efficient way to get a response.  Please allow 48 business hours for a response.  Please remember that this is for non-urgent requests.   Thank you for choosing me and [facility].  Dr.Jay Pyrtle

## 2020-12-29 ENCOUNTER — Other Ambulatory Visit: Payer: Self-pay | Admitting: Orthopaedic Surgery

## 2020-12-29 ENCOUNTER — Telehealth: Payer: Self-pay | Admitting: Orthopaedic Surgery

## 2020-12-29 MED ORDER — IBUPROFEN 800 MG PO TABS: 800.0000 mg | ORAL_TABLET | Freq: Three times a day (TID) | ORAL | 1 refills | Status: DC | PRN

## 2020-12-29 NOTE — Telephone Encounter (Signed)
Pt called requesting 800 ibuprofen or percots. Please send pain medication to pharmacy on file. Pt phone number is [phone].

## 2021-01-13 ENCOUNTER — Telehealth: Payer: Self-pay | Admitting: Internal Medicine

## 2021-01-13 NOTE — Telephone Encounter (Signed)
James [name] [phone]  [name] called to see if he could get a referral for a back doctor, he is getting a MRI next month 02/09/2021 and was wanting to get a MRI of back at the same time. I tried explaining to him, that it was not that easy to get an MRI. He would need to have recent office notes for having back pain and taking medication and most insurance companies also required physical therapy first. I also let him know to do a referral we would need recent office notes of seeing him for back pain. He said we could schedule him for office visit, I let him know the earliest we could see him would be late next week. I also let him know that he could call Dr Adela Ports office and let them know he was having back pain and they would schedule him with one of the doctors that see patients for back pain. That they do not need referral.

## 2021-01-14 NOTE — Telephone Encounter (Signed)
Called and spoke with patient and let him know what Dr Renold Genta said, he verbalized understanding

## 2021-01-21 ENCOUNTER — Other Ambulatory Visit: Payer: Self-pay

## 2021-01-21 ENCOUNTER — Encounter: Payer: Self-pay | Admitting: Pulmonary Disease

## 2021-01-21 ENCOUNTER — Ambulatory Visit (INDEPENDENT_AMBULATORY_CARE_PROVIDER_SITE_OTHER): Payer: Medicare Other | Admitting: Pulmonary Disease

## 2021-01-21 VITALS — BP 118/76 | HR 65 | Temp 98.0°F | Ht 70.0 in | Wt 197.6 lb

## 2021-01-21 DIAGNOSIS — Z87891 Personal history of nicotine dependence: Secondary | ICD-10-CM

## 2021-01-21 DIAGNOSIS — G4733 Obstructive sleep apnea (adult) (pediatric): Secondary | ICD-10-CM | POA: Diagnosis not present

## 2021-01-21 DIAGNOSIS — J309 Allergic rhinitis, unspecified: Secondary | ICD-10-CM

## 2021-01-21 NOTE — Assessment & Plan Note (Addendum)
He has more than 40-pack-year history of smoking and would qualify for lung cancer screening program.  We discussed possibility of finding benign conditions and other organs or lung nodules of other etiologies that would lead to need less anxiety and require follow-up testing.  He would be agreeable to it his wife was  diagnosed with lung cancer so he understands the implication of not having a screening study all too well. We will proceed with scheduling a low-dose CT scan.  I offered him spirometry testing but he declined and since he is not symptomatic I am okay with that

## 2021-01-21 NOTE — Assessment & Plan Note (Signed)
He has longstanding use of Afrin and rhinitis medicamentosa.  He has seen ENT in the remote past but would like to revisit to explore surgical options.  Currently CPAP and humidity is actually helping him breathe through the night.  We will refer him to ENT

## 2021-01-21 NOTE — Progress Notes (Signed)
Subjective:    Patient ID: James [name], male    DOB: 1955-03-01, 66 y.o.   MRN: [id number]  HPI 66 year old ex-smoker presents to reestablish care for OSA on CPAP, last seen in our office 01/2017. He started smoking at age 67, smoked about a pack per day until he quit in 2016, more than 40 pack years.  He is interested in getting a lung cancer screening CT scan  PMH - ulcerative colitis , rhinitis medicamentosa   Chief Complaint  Patient presents with   Consult    Sleep consult. Is already on a CPAP machine wears it every night. Did see Sood in 2019. Already had a sleep study. Pt is here for wanting a lung scan.    OSA was diagnosed in 2017, he was placed on auto CPAP with significant improvement in his daytime somnolence and fatigue.  He is lost 7 pounds since then from 204 to his current weight of 197 pounds.  He has been very compliant with his machine.  His last office visit in 2019 was reviewed and CPAP download showed good compliance. CPAP download was again reviewed today which shows excellent control of events with average pressure of 9 cm and maximum pressure of 11 cm on auto 5 to 15 cm settings.  He has settled down with nasal pillows and seems to like this interface.  He denies any problems with pressure.  He has longstanding allergic rhinitis and has been using Afrin on a daily basis for many years.  He would like to consider surgical options.  CPAP and humidity certainly seems to help this problem because otherwise he is a mouth breather  His long smoking history was reviewed.  His wife was diagnosed with stage IV lung cancer with mets to spine and brain and is undergoing chemotherapy.  They take an annual visit to [place].  He denies significant dyspnea but mobility seems to be more limited by hip discomfort rather than dyspnea.  He is ambulating with a cane.  He is interested in getting a screening study.  Epworth sleepiness score is 1.  Bedtime is between 9 and 11 PM,  sleep latency is minimal, he reports several nocturnal awakenings including nocturia and is out of bed anywhere between 7 and 10 AM feeling tired without dryness of mouth or headaches    Significant tests/ events reviewed  HST 10/07/15 >> AHI 48  Auto CPAP 12/31/16 to 01/29/17 >> used on 29 of 30 nights with average 5 hrs 44 min.  Average AHI 0.8 with median CPAP 7 and 95 th percentile CPAP 9 cm H2O   Past Medical History:  Diagnosis Date   Anemia    Aortic atherosclerosis ([place])    Colitis    Degenerative disc disease    Diverticulosis 2013   Colonoscopy   GERD (gastroesophageal reflux disease)    Hiatal hernia    Hx of colonic polyps 2013   Colonoscopy- tubular adenoma, and hyperplastic    Hyperlipidemia    Internal hemorrhoids 2013   Colonoscopy   Lower back pain    Right inguinal hernia    Sleep apnea 2015   on CPAP   Tubular adenoma    Past Surgical History:  Procedure Laterality Date   COLONOSCOPY     HERNIA REPAIR  1980   left inguinal   TOTAL HIP ARTHROPLASTY Right 03/14/2014   Procedure: RIGHT TOTAL HIP ARTHROPLASTY ANTERIOR APPROACH ;  Surgeon: Mcarthur Rossetti, MD;  Location: [facility];  Service:  Orthopedics;  Laterality: Right;   WISDOM TOOTH EXTRACTION      .all  Social History   Socioeconomic History   Marital status: Married    Spouse name: Not on file   Number of children: 5   Years of education: Not on file   Highest education level: Not on file  Occupational History   Occupation: Academic librarian  Tobacco Use   Smoking status: Former    Packs/day: 0.25    Years: 30.00    Pack years: 7.50    Types: Cigarettes    Quit date: 03/09/2014    Years since quitting: 6.8   Smokeless tobacco: Never   Tobacco comments:    Counseling sheet given 08-2011  Vaping Use   Vaping Use: Never used  Substance and Sexual Activity   Alcohol use: Yes    Alcohol/week: 7.0 standard drinks    Types: 7 Standard drinks or equivalent per week    Comment: week   Drug  use: No   Sexual activity: Not on file  Other Topics Concern   Not on file  Social History Narrative   Not on file   Social Determinants of Health   Financial Resource Strain: Low Risk    Difficulty of Paying Living Expenses: Not hard at all  Food Insecurity: No Food Insecurity   Worried About Charity fundraiser in the Last Year: Never true   [place] in the Last Year: Never true  Transportation Needs: No Transportation Needs   Lack of Transportation (Medical): No   Lack of Transportation (Non-Medical): No  Physical Activity: Insufficiently Active   Days of Exercise per Week: 2 days   Minutes of Exercise per Session: 30 min  Stress: Stress Concern Present   Feeling of Stress : To some extent  Social Connections: Moderately Integrated   Frequency of Communication with Friends and Family: More than three times a week   Frequency of Social Gatherings with Friends and Family: Once a week   Attends Religious Services: More than 4 times per year   Active Member of [employer] or Organizations: No   Attends Music therapist: Never   Marital Status: Married  Human resources officer Violence: Not At Risk   Fear of Current or Ex-Partner: No   Emotionally Abused: No   Physically Abused: No   Sexually Abused: No     Family History  Problem Relation Age of Onset   Heart disease Father    Colon cancer Father 71   Esophageal cancer Father        late 49's   Colon cancer Mother        late 43s   Stomach cancer Neg Hx    AAA (abdominal aortic aneurysm) Neg Hx    Rectal cancer Neg Hx       Review of Systems  Constitutional: negative for anorexia, fevers and sweats  Eyes: negative for irritation, redness and visual disturbance  Ears, nose, mouth, throat, and face: negative for earaches, epistaxis, nasal congestion and sore throat  Respiratory: negative for cough, dyspnea on exertion, sputum and wheezing  Cardiovascular: negative for chest pain, dyspnea, lower extremity  edema, orthopnea, palpitations and syncope  Gastrointestinal: negative for abdominal pain, constipation, diarrhea, melena, nausea and vomiting  Genitourinary:negative for dysuria, frequency and hematuria  Hematologic/lymphatic: negative for bleeding, easy bruising and lymphadenopathy  Musculoskeletal:negative for arthralgias, muscle weakness and stiff joints  Neurological: negative for coordination problems, gait problems, headaches and weakness  Endocrine: negative for diabetic  symptoms including polydipsia, polyuria and weight loss     Objective:   Physical Exam  Gen. Pleasant, well-nourished, in no distress, normal affect ENT - no pallor,icterus, no post nasal drip, long uvula, overbite 65m Neck: No JVD, no thyromegaly, no carotid bruits Lungs: no use of accessory muscles, no dullness to percussion, clear without rales or rhonchi  Cardiovascular: Rhythm regular, heart sounds  normal, no murmurs or gallops, no peripheral edema Abdomen: soft and non-tender, no hepatosplenomegaly, BS normal. Musculoskeletal: No deformities, no cyanosis or clubbing Neuro:  alert, non focal       Assessment & Plan:   Assessment:    1 or more chronic illnesses with severe exacerbation, progression, or side effects of treatment;   1 acute or chronic illness or injury that poses a threat to life or bodily function  Plan Following Extensive Data Review & Interpretation:   I reviewed prior external note(s) from PCP  I reviewed the result(s) of chest x-ray 03/26/2020 no nodules or effusions, home sleep test  I have ordered low-dose CT screening study  Independent interpretation of tests  Review of patient's chest x-ray images revealed hyperinflation. The patient's images have been independently reviewed by me.    Discussion of management or test interpretation with another colleague PCP.

## 2021-01-21 NOTE — Assessment & Plan Note (Signed)
He is very compliant with his CPAP machine and this is only helped improve his daytime somnolence and fatigue. He would qualify for a new CPAP machine.  We will send in prescription for auto CPAP 5 to 12 cm to DME with nasal pillows   compliance with goal of at least 4-6 hrs every night is the expectation. Advised against medications with sedative side effects Cautioned against driving when sleepy - understanding that sleepiness will vary on a day to day basis

## 2021-01-21 NOTE — Patient Instructions (Addendum)
° °  X check [employer] website for recall & register for new  auto CPAP 5-10 cm  X CPAP supplies will be renewed   X LDCT chest for jan 24 @ [facility]

## 2021-01-25 ENCOUNTER — Encounter: Payer: Self-pay | Admitting: Internal Medicine

## 2021-01-26 ENCOUNTER — Other Ambulatory Visit: Payer: Medicare Other

## 2021-02-05 ENCOUNTER — Other Ambulatory Visit: Payer: Self-pay | Admitting: General Surgery

## 2021-02-05 DIAGNOSIS — R1031 Right lower quadrant pain: Secondary | ICD-10-CM

## 2021-02-09 ENCOUNTER — Other Ambulatory Visit: Payer: Self-pay

## 2021-02-09 ENCOUNTER — Ambulatory Visit
Admission: RE | Admit: 2021-02-09 | Discharge: 2021-02-09 | Disposition: A | Discharge status: Home / Self Care | Payer: Medicare Other | Source: Ambulatory Visit | Attending: Orthopaedic Surgery | Admitting: Orthopaedic Surgery

## 2021-02-09 DIAGNOSIS — M25552 Pain in left hip: Secondary | ICD-10-CM

## 2021-02-10 ENCOUNTER — Ambulatory Visit
Admission: RE | Admit: 2021-02-10 | Discharge: 2021-02-10 | Disposition: A | Discharge status: Home / Self Care | Payer: Medicare Other | Source: Ambulatory Visit | Attending: Pulmonary Disease | Admitting: Pulmonary Disease

## 2021-02-10 ENCOUNTER — Ambulatory Visit
Admission: RE | Admit: 2021-02-10 | Discharge: 2021-02-10 | Disposition: A | Discharge status: Home / Self Care | Payer: Medicare Other | Source: Ambulatory Visit | Attending: General Surgery | Admitting: General Surgery

## 2021-02-10 DIAGNOSIS — J309 Allergic rhinitis, unspecified: Secondary | ICD-10-CM

## 2021-02-10 DIAGNOSIS — R1031 Right lower quadrant pain: Secondary | ICD-10-CM

## 2021-02-10 DIAGNOSIS — Z87891 Personal history of nicotine dependence: Secondary | ICD-10-CM

## 2021-02-10 DIAGNOSIS — G4733 Obstructive sleep apnea (adult) (pediatric): Secondary | ICD-10-CM

## 2021-02-15 ENCOUNTER — Other Ambulatory Visit: Payer: Self-pay

## 2021-02-15 ENCOUNTER — Encounter: Payer: Self-pay | Admitting: Orthopaedic Surgery

## 2021-02-15 ENCOUNTER — Ambulatory Visit (INDEPENDENT_AMBULATORY_CARE_PROVIDER_SITE_OTHER): Payer: Medicare Other | Admitting: Orthopaedic Surgery

## 2021-02-15 DIAGNOSIS — M1612 Unilateral primary osteoarthritis, left hip: Secondary | ICD-10-CM | POA: Diagnosis not present

## 2021-02-15 DIAGNOSIS — M25552 Pain in left hip: Secondary | ICD-10-CM

## 2021-02-15 DIAGNOSIS — Z87891 Personal history of nicotine dependence: Secondary | ICD-10-CM

## 2021-02-15 MED ORDER — ACETAMINOPHEN-CODEINE #3 300-30 MG PO TABS: 1.0000 | ORAL_TABLET | Freq: Three times a day (TID) | ORAL | 0 refills | Status: DC | PRN

## 2021-02-15 MED ORDER — TIZANIDINE HCL 4 MG PO TABS: 4.0000 mg | ORAL_TABLET | Freq: Three times a day (TID) | ORAL | 1 refills | Status: DC | PRN

## 2021-02-15 NOTE — Progress Notes (Signed)
The patient is well-known to me.  I have actually replaced his right hip in 2016.  He has had significantly worsening left hip pain.  His plain film still showed adequate joint space but given the severity of his pain on rotation of the left hip we sent him for an MRI of the left hip.  He is here for review of this today.  His hip pain is gotten significantly worse for him as well.  He recently had a CT scan of his chest, abdomen, and pelvis for cancer surveillance.  I was able to see the left hip on the CT scan as well.  He still has quite severe pain with any attempts of internal and external rotation as well as flexion of his left hip.  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  There are cystic changes in the femoral head and neck and acetabulum as well as edema.  The edema is at the weightbearing aspect of the femoral head and acetabulum.  There is degenerative labral tear as well and significant cartilage degeneration.  At this point given the severity of his arthritis and the severity of his pain combined with his clinical exam findings, we are recommending a left hip replacement and he agrees with this as well.  He cannot have steroids at this point due to the damage that does cause to his eyes.  I will send in some Zanaflex and Tylenol 3 while we work on getting his left hip scheduled for surgery for hip replacement.  Having had this done before he is fully aware of the risks and benefits of the surgery and what to expect with any of the operative and postoperative course.

## 2021-02-26 ENCOUNTER — Other Ambulatory Visit: Payer: Self-pay | Admitting: Orthopaedic Surgery

## 2021-02-26 ENCOUNTER — Telehealth: Payer: Self-pay

## 2021-02-26 MED ORDER — ACETAMINOPHEN-CODEINE #3 300-30 MG PO TABS: 1.0000 | ORAL_TABLET | Freq: Three times a day (TID) | ORAL | 0 refills | Status: DC | PRN

## 2021-02-26 NOTE — Telephone Encounter (Signed)
I called patient and scheduled left THA for 04/30/21.  He is wondering if you can recommend something to help with locking in his groin in the meantime.  Please call.

## 2021-02-26 NOTE — Telephone Encounter (Signed)
Please advise 

## 2021-03-01 ENCOUNTER — Other Ambulatory Visit: Payer: Self-pay

## 2021-03-01 ENCOUNTER — Ambulatory Visit (AMBULATORY_SURGERY_CENTER): Payer: Medicare Other | Admitting: *Deleted

## 2021-03-01 VITALS — Ht 71.0 in | Wt 195.0 lb

## 2021-03-01 DIAGNOSIS — Z86010 Personal history of colonic polyps: Secondary | ICD-10-CM

## 2021-03-01 MED ORDER — PEG 3350-KCL-NA BICARB-NACL 420 G PO SOLR: 4000.0000 mL | Freq: Once | ORAL | 0 refills | Status: AC

## 2021-03-01 NOTE — Telephone Encounter (Signed)
I called and advised pt. He cant do steroids. He asked to be put on a wait list to have sx sooner if possible

## 2021-03-01 NOTE — Progress Notes (Signed)
No egg or soy allergy known to patient  No issues known to pt with past sedation with any surgeries or procedures Patient denies ever being told they had issues or difficulty with intubation  No FH of Malignant Hyperthermia Pt is not on diet pills Pt is not on  home 02  Pt is not on blood thinners  Pt denies issues with constipation,encouraged to continue taking Miralax daily. No A fib or A flutter  Pt is fully vaccinated  for Covid     Due to the COVID-19 pandemic we are asking patients to follow certain guidelines in PV and the [place]   Pt aware of COVID protocols and [facility] guidelines   PV completed over the phone. Pt verified name, DOB, address and insurance during PV today.   Pt encouraged to call with questions or issues.  If pt has My chart, procedure instructions sent via My Chart

## 2021-03-04 ENCOUNTER — Encounter: Payer: Self-pay | Admitting: Orthopaedic Surgery

## 2021-03-15 ENCOUNTER — Ambulatory Visit (AMBULATORY_SURGERY_CENTER): Payer: Medicare Other | Admitting: Internal Medicine

## 2021-03-15 ENCOUNTER — Other Ambulatory Visit: Payer: Self-pay

## 2021-03-15 ENCOUNTER — Other Ambulatory Visit: Payer: Medicare Other

## 2021-03-15 ENCOUNTER — Encounter: Payer: Self-pay | Admitting: Internal Medicine

## 2021-03-15 VITALS — BP 117/66 | HR 61 | Temp 98.4°F | Resp 11 | Ht 70.0 in | Wt 195.0 lb

## 2021-03-15 DIAGNOSIS — K621 Rectal polyp: Secondary | ICD-10-CM

## 2021-03-15 DIAGNOSIS — K635 Polyp of colon: Secondary | ICD-10-CM | POA: Diagnosis not present

## 2021-03-15 DIAGNOSIS — K5150 Left sided colitis without complications: Secondary | ICD-10-CM

## 2021-03-15 DIAGNOSIS — K2270 Barrett's esophagus without dysplasia: Secondary | ICD-10-CM

## 2021-03-15 DIAGNOSIS — K449 Diaphragmatic hernia without obstruction or gangrene: Secondary | ICD-10-CM

## 2021-03-15 DIAGNOSIS — D120 Benign neoplasm of cecum: Secondary | ICD-10-CM

## 2021-03-15 DIAGNOSIS — D128 Benign neoplasm of rectum: Secondary | ICD-10-CM

## 2021-03-15 MED ORDER — SODIUM CHLORIDE 0.9 % IV SOLN: 500.0000 mL | Freq: Once | INTRAVENOUS | Status: DC

## 2021-03-15 NOTE — Op Note (Signed)
[facility] Patient Name: James [name] Procedure Date: 03/15/2021 1:35 PM MRN: [id number] Endoscopist: Jerene Bears , MD Age: 66 Referring MD:  Date of Birth: 1955/01/24 Gender: Male Account #: [id number] Procedure:                Upper GI endoscopy Indications:              Barrett's esophagus, last EGD May 2018 Medicines:                Monitored Anesthesia Care Procedure:                Pre-Anesthesia Assessment:                           - Prior to the procedure, a History and Physical                            was performed, and patient medications and                            allergies were reviewed. The patient's tolerance of                            previous anesthesia was also reviewed. The risks                            and benefits of the procedure and the sedation                            options and risks were discussed with the patient.                            All questions were answered, and informed consent                            was obtained. Prior Anticoagulants: The patient has                            taken no previous anticoagulant or antiplatelet                            agents. ASA Grade Assessment: III - A patient with                            severe systemic disease. After reviewing the risks                            and benefits, the patient was deemed in                            satisfactory condition to undergo the procedure.                           After obtaining informed consent, the endoscope was  passed under direct vision. Throughout the                            procedure, the patient's blood pressure, pulse, and                            oxygen saturations were monitored continuously. The                            GIF [id number] #[id number] was introduced through the                            mouth, and advanced to the second part of duodenum.                            The upper GI  endoscopy was accomplished without                            difficulty. The patient tolerated the procedure                            well. Scope In: Scope Out: Findings:                 The esophagus and gastroesophageal junction were                            examined with white light and narrow band imaging                            (NBI) from a forward view and retroflexed position.                            There were esophageal mucosal changes consistent                            with short-segment Barrett's esophagus. These                            changes involved the mucosa at the upper extent of                            the gastric folds (38 cm from the incisors)                            extending to the Z-line (38 cm from the incisors).                            The maximum longitudinal extent of these esophageal                            mucosal changes was 3 cm in length. Mucosa was  biopsied with a cold forceps for histology in a                            targeted manner and in 4 quadrants at intervals of                            1 cm. A total of 2 specimen bottles were sent to                            pathology.                           A 2 cm hiatal hernia was present.                           The entire examined stomach was normal.                           The examined duodenum was normal. Complications:            No immediate complications. Estimated Blood Loss:     Estimated blood loss was minimal. Impression:               - Esophageal mucosal changes consistent with                            short-segment Barrett's esophagus. Biopsied.                           - 2 cm hiatal hernia.                           - Normal stomach.                           - Normal examined duodenum. Recommendation:           - Patient has a contact number available for                            emergencies. The signs and symptoms of  potential                            delayed complications were discussed with the                            patient. Return to normal activities tomorrow.                            Written discharge instructions were provided to the                            patient.                           - Resume previous diet.                           -  Continue present medications.                           - Await pathology results.                           - Repeat upper endoscopy for surveillance based on                            pathology results. Jerene Bears, MD 03/15/2021 2:16:40 PM This report has been signed electronically.

## 2021-03-15 NOTE — Progress Notes (Signed)
Report to PACU, RN, vss, BBS= Clear.  

## 2021-03-15 NOTE — Op Note (Signed)
[place] Patient Name: James [name] Procedure Date: 03/15/2021 1:34 PM MRN: [id number] Endoscopist: Jerene Bears , MD Age: 66 Referring MD:  Date of Birth: [birth year] Gender: Male Account #: [id number] Procedure:                Colonoscopy Indications:              Disease activity assessment and to assess                            therapeutic response to therapy of left-sided                            chronic ulcerative colitis, currently on mesalamine                            4.8 g daily. Last colon Sept 2021. Hx of SSP. Medicines:                Monitored Anesthesia Care Procedure:                Pre-Anesthesia Assessment:                           - Prior to the procedure, a History and Physical                            was performed, and patient medications and                            allergies were reviewed. The patient's tolerance of                            previous anesthesia was also reviewed. The risks                            and benefits of the procedure and the sedation                            options and risks were discussed with the patient.                            All questions were answered, and informed consent                            was obtained. Prior Anticoagulants: The patient has                            taken no previous anticoagulant or antiplatelet                            agents. ASA Grade Assessment: III - A patient with                            severe systemic disease. After reviewing the risks  and benefits, the patient was deemed in                            satisfactory condition to undergo the procedure.                           After obtaining informed consent, the colonoscope                            was passed under direct vision. Throughout the                            procedure, the patient's blood pressure, pulse, and                            oxygen saturations were  monitored continuously. The                            Olympus CF-HQ190L [id number]) Colonoscope was                            introduced through the anus and advanced to the                            terminal ileum. The colonoscopy was performed                            without difficulty. The patient tolerated the                            procedure well. The quality of the bowel                            preparation was good. The terminal ileum, ileocecal                            valve, appendiceal orifice, and rectum were                            photographed. Scope In: 1:48:49 PM Scope Out: 2:11:16 PM Scope Withdrawal Time: 0 hours 14 minutes 45 seconds  Total Procedure Duration: 0 hours 22 minutes 27 seconds  Findings:                 The digital rectal exam was normal.                           The terminal ileum appeared normal.                           Inflammation characterized by altered vascularity,                            congestion (edema), erythema, friability,  granularity and pseudopolyps was found as patches                            surrounded by normal mucosa. Inflammation moderate                            in the rectum (3-4 cm from dentate line), in the                            descending colon and in the transverse colon there                            are pseudopolyps without overt inflammation/active                            colitis today.. The recto-sigmoid colon, the mid                            sigmoid colon, the distal sigmoid colon, the                            proximal transverse colon, the ascending colon and                            the cecum were spared. This was graded as Mayo                            Score 2 (moderate, with marked erythema, absent                            vascular pattern, friability, erosions) in the                            rectum. Four biopsies were taken every 10 cm with a                             cold forceps from the entire colon for dysplasia                            surveillance and ulcerative/indeterminate colitis                            surveillance. These biopsy specimens from the                            cecum/ascending colon, transverse colon, descending                            colon/sigmoid colon and rectum were sent to                            Pathology.  A 4 mm polyp was found in the cecum. The polyp was                            sessile. The polyp was removed with a cold snare.                            Resection and retrieval were complete.                           A 6 mm polyp was found in the rectum. The polyp was                            sessile. The polyp was removed with a cold snare.                            Resection and retrieval were complete.                           Multiple small and large-mouthed diverticula were                            found in the sigmoid colon, descending colon and                            ascending colon.                           Internal hemorrhoids were found during                            retroflexion. The hemorrhoids were small. Complications:            No immediate complications. Estimated Blood Loss:     Estimated blood loss was minimal. Impression:               - The examined portion of the ileum was normal.                           - Colitis, indeterminate. Active inflammation in                            distal rectum (proctitis). Inactive inflammation                            with pseudopolyps in the proximal sigmoid,                            descending colon and in the distal transverse                            colon. This was graded as Mayo Score 2 (moderate                            disease). Biopsied.                           -  One 4 mm polyp in the cecum, removed with a cold                            snare. Resected and  retrieved.                           - One 6 mm polyp in the rectum, removed with a cold                            snare. Resected and retrieved.                           - Diverticulosis in the sigmoid colon, in the                            descending colon and in the ascending colon.                           - Small internal hemorrhoids. Recommendation:           - Patient has a contact number available for                            emergencies. The signs and symptoms of potential                            delayed complications were discussed with the                            patient. Return to normal activities tomorrow.                            Written discharge instructions were provided to the                            patient.                           - Resume previous diet.                           - Continue present medications.                           - Consideration of Entyvio start. Labs today.                           - Office visit with me in April or May this year.                           - Await pathology results.                           - Repeat colonoscopy is recommended for  surveillance. The colonoscopy date will be                            determined after pathology results from today's                            exam become available for review. Jerene Bears, MD 03/15/2021 2:30:39 PM This report has been signed electronically.

## 2021-03-15 NOTE — Patient Instructions (Addendum)
Handouts given for Hiatal Hernia, Diverticulosis and Polyps.  Visit our Lab today prior to Discharge, our tech will transport you.  Follow up visit with Dr Hilarie Fredrickson in April or May this year.  YOU HAD AN ENDOSCOPIC PROCEDURE TODAY AT [facility]:   Refer to the procedure report that was given to you for any specific questions about what was found during the examination.  If the procedure report does not answer your questions, please call your gastroenterologist to clarify.  If you requested that your care partner not be given the details of your procedure findings, then the procedure report has been included in a sealed envelope for you to review at your convenience later.  YOU SHOULD EXPECT: Some feelings of bloating in the abdomen. Passage of more gas than usual.  Walking can help get rid of the air that was put into your GI tract during the procedure and reduce the bloating. If you had a lower endoscopy (such as a colonoscopy or flexible sigmoidoscopy) you may notice spotting of blood in your stool or on the toilet paper. If you underwent a bowel prep for your procedure, you may not have a normal bowel movement for a few days.  Please Note:  You might notice some irritation and congestion in your nose or some drainage.  This is from the oxygen used during your procedure.  There is no need for concern and it should clear up in a day or so.  SYMPTOMS TO REPORT IMMEDIATELY:  Following lower endoscopy (colonoscopy):  Excessive amounts of blood in the stool  Significant tenderness or worsening of abdominal pains  Swelling of the abdomen that is new, acute  Fever of 100F or higher  Following upper endoscopy (EGD)  Vomiting of blood or coffee ground material  New chest pain or pain under the shoulder blades  Painful or persistently difficult swallowing  New shortness of breath  Black, tarry-looking stools  For urgent or emergent issues, a gastroenterologist can be reached at  any hour by calling [phone]. Do not use MyChart messaging for urgent concerns.   DIET:  We do recommend a small meal at first, but then you may proceed to your regular diet.  Drink plenty of fluids but you should avoid alcoholic beverages for 24 hours.  ACTIVITY:  You should plan to take it easy for the rest of today and you should NOT DRIVE, NO WORK or use heavy machinery until tomorrow (because of the sedation medicines used during the test).    FOLLOW UP: Our staff will call the number listed on your records Wednesday between 715-8 AM following your procedure to check on you and address any questions or concerns that you may have regarding the information given to you following your procedure. If we do not reach you, we will leave a message.  We will attempt to reach you two times.  During this call, we will ask if you have developed any symptoms of COVID 19. If you develop any symptoms (ie: fever, flu-like symptoms, shortness of breath, cough etc.) before then, please call [phone].  If you test positive for Covid 19 in the 2 weeks post procedure, please call and report this information to [place].    If any biopsies were taken you will be contacted by phone or by letter within the next 1-3 weeks.  Please call us at [phone] if you have not heard about the biopsies in 3 weeks.    SIGNATURES/CONFIDENTIALITY: You and/or your  care partner have signed paperwork which will be entered into your electronic medical record.  These signatures attest to the fact that that the information above on your After Visit Summary has been reviewed and is understood.  Full responsibility of the confidentiality of this discharge information lies with you and/or your care-partner.

## 2021-03-15 NOTE — Progress Notes (Signed)
Vitals-CW  Pt's states no medical or surgical changes since previsit or office visit. 

## 2021-03-16 LAB — HEPATITIS B SURFACE ANTIBODY,QUALITATIVE: Hep B S Ab: NONREACTIVE

## 2021-03-16 LAB — HEPATITIS B SURFACE ANTIGEN: Hepatitis B Surface Ag: NONREACTIVE

## 2021-03-16 LAB — HEPATITIS C ANTIBODY
Hepatitis C Ab: NONREACTIVE
SIGNAL TO CUT-OFF: 0.05 (ref <1.00)

## 2021-03-16 LAB — HEPATITIS B CORE ANTIBODY, TOTAL: Hep B Core Total Ab: NONREACTIVE

## 2021-03-17 ENCOUNTER — Telehealth: Payer: Self-pay

## 2021-03-17 NOTE — Telephone Encounter (Signed)
Left message o0n answering machine. ?

## 2021-03-18 ENCOUNTER — Other Ambulatory Visit: Payer: Self-pay

## 2021-03-22 ENCOUNTER — Encounter: Payer: Self-pay | Admitting: Internal Medicine

## 2021-03-29 ENCOUNTER — Telehealth: Payer: Self-pay | Admitting: Internal Medicine

## 2021-03-29 ENCOUNTER — Telehealth: Payer: Self-pay

## 2021-03-29 NOTE — Telephone Encounter (Signed)
Gardiner Fanti ?[phone] ? ?[name] called to see if he could get refill on below medication, he said it was last June when he had it last, so I let him know he would probably need OV.  ? ?Last prescription shows he should have a refill, he is going to check with pharmacy and call back, he he still needs one. ? ?Last CPE 10/02/20 ?Schedule 9/48/23 ? ?ALPRAZolam (XANAX) 0.25 MG tablet ? ?[facility] [phone] - [place], [place] - [address] Phone:  [phone]  ?Fax:  [phone]  ?  ? ?

## 2021-03-29 NOTE — Telephone Encounter (Signed)
Faxed copy of CPAP supplies to [facility] at [phone].  ? ?                  -------Fax Transmission Report------- ? ?To:               Recipient at [phone] ?Subject:          CPAP physician order ?Result:           The transmission was successful. ?Explanation:      All Pages Ok ?Pages Sent:       2 ?Connect Time:     1 minutes, 39 seconds ?Transmit Time:    03/29/2021 15:36 ?Transfer Rate:    14400 ?Status Code:      0000 ?Retry Count:      0 ?Job Id:           [id number] ?Unique Id:        [id number] ?Fax Line:         35 ?Fax Server:       [id number] ? ? ?

## 2021-03-30 ENCOUNTER — Telehealth: Payer: Self-pay

## 2021-03-30 NOTE — Telephone Encounter (Signed)
Patient called in a would like a refill on his pain medication. Please advise it was tylenol 3 ?

## 2021-03-31 ENCOUNTER — Other Ambulatory Visit: Payer: Self-pay | Admitting: Physician Assistant

## 2021-03-31 MED ORDER — ACETAMINOPHEN-CODEINE #3 300-30 MG PO TABS: 1.0000 | ORAL_TABLET | Freq: Three times a day (TID) | ORAL | 0 refills | Status: DC | PRN

## 2021-03-31 NOTE — Telephone Encounter (Signed)
Called and advised.

## 2021-03-31 NOTE — Telephone Encounter (Signed)
Please advise 

## 2021-04-06 ENCOUNTER — Other Ambulatory Visit: Payer: Self-pay | Admitting: Orthopaedic Surgery

## 2021-04-08 ENCOUNTER — Other Ambulatory Visit: Payer: Self-pay | Admitting: Physician Assistant

## 2021-04-08 DIAGNOSIS — M1612 Unilateral primary osteoarthritis, left hip: Secondary | ICD-10-CM

## 2021-04-16 NOTE — Patient Instructions (Addendum)
DUE TO COVID-19 ONLY ONE VISITOR  (aged 67 and older)  IS ALLOWED TO COME WITH YOU AND STAY IN THE WAITING ROOM ONLY DURING PRE OP AND PROCEDURE.   ?**NO VISITORS ARE ALLOWED IN THE SHORT STAY AREA OR RECOVERY ROOM!!** ? ?IF YOU WILL BE ADMITTED INTO THE HOSPITAL YOU ARE ALLOWED ONLY TWO SUPPORT PEOPLE DURING VISITATION HOURS ONLY (7 AM -8PM)   ?The support person(s) must pass our screening, gel in and out, and wear a mask at all times, including in the patient?s room. ?Patients must also wear a mask when staff or their support person are in the room. ?Visitors GUEST BADGE MUST BE WORN VISIBLY  ?One adult visitor may remain with you overnight and MUST be in the room by 8 P.M. ?  ? ? Your procedure is scheduled on: 04/30/21 ? ? Report to [facility] Main Entrance ? ?  Report to admitting at 9:30 AM ? ? Call this number if you have problems the morning of surgery [phone] ? ? Do not eat food :After Midnight. ? ? After Midnight you may have the following liquids until 9:15 AM DAY OF SURGERY ? ?Water ?Black Coffee (sugar ok, NO MILK/CREAM OR CREAMERS)  ?Tea (sugar ok, NO MILK/CREAM OR CREAMERS) regular and decaf                             ?Plain Jell-O (NO RED)                                           ?Fruit ices (not with fruit pulp, NO RED)                                     ?Popsicles (NO RED)                                                                  ?Juice: apple, WHITE grape, WHITE cranberry ?Sports drinks like Gatorade (NO RED) ?Clear broth(vegetable,chicken,beef) ?  ?The day of surgery:  ?Drink ONE (1) Pre-Surgery Clear Ensure at 9:15 AM the morning of surgery. Drink in one sitting. Do not sip.  ?This drink was given to you during your hospital  ?pre-op appointment visit. ?Nothing else to drink after completing the  ?Pre-Surgery Clear Ensure. ?  ?       If you have questions, please contact your surgeon?s office. ? ? ?FOLLOW BOWEL PREP AND ANY ADDITIONAL PRE OP INSTRUCTIONS YOU RECEIVED FROM  YOUR SURGEON'S OFFICE!!! ?  ?  ?Oral Hygiene is also important to reduce your risk of infection.                                    ?Remember - BRUSH YOUR TEETH THE MORNING OF SURGERY WITH YOUR REGULAR TOOTHPASTE ? ? Take these medicines the morning of surgery with A SIP OF WATER: Tylenol, Xanax, Pantoprazole, Rosuvastatin.  ? ?Bring CPAP mask and tubing day of surgery. ?                  ?  You may not have any metal on your body including jewelry, and body piercing ? ?           Do not wear lotions, powders, cologne, or deodorant ? ?            Men may shave face and neck. ? ? Do not bring valuables to the hospital. [facility] NOT ?            RESPONSIBLE   FOR VALUABLES. ? ? Bring small overnight bag day of surgery. ? ? Special Instructions: Bring a copy of your healthcare power of attorney and living will documents         the day of surgery if you haven't scanned them before. ? ?            Please read over the following fact sheets you were given: IF [place] [phone]- Apolonio Schneiders  ? ?   [facility] - Preparing for Surgery ?Before surgery, you can play an important role.  Because skin is not sterile, your skin needs to be as free of germs as possible.  You can reduce the number of germs on your skin by washing with CHG (chlorahexidine gluconate) soap before surgery.  CHG is an antiseptic cleaner which kills germs and bonds with the skin to continue killing germs even after washing. ?Please DO NOT use if you have an allergy to CHG or antibacterial soaps.  If your skin becomes reddened/irritated stop using the CHG and inform your nurse when you arrive at Short Stay. ?Do not shave (including legs and underarms) for at least 48 hours prior to the first CHG shower.  You may shave your face/neck. ? ?Please follow these instructions carefully: ? 1.  Shower with CHG Soap the night before surgery and the  morning of surgery. ? 2.  If you choose to wash  your hair, wash your hair first as usual with your normal  shampoo. ? 3.  After you shampoo, rinse your hair and body thoroughly to remove the shampoo.                            ? 4.  Use CHG as you would any other liquid soap.  You can apply chg directly to the skin and wash.  Gently with a scrungie or clean washcloth. ? 5.  Apply the CHG Soap to your body ONLY FROM THE NECK DOWN.   Do   not use on face/ open      ?                     Wound or open sores. Avoid contact with eyes, ears mouth and   genitals (private parts).  ?                     Production manager,  Genitals (private parts) with your normal soap. ?            6.  Wash thoroughly, paying special attention to the area where your    surgery  will be performed. ? 7.  Thoroughly rinse your body with warm water from the neck down. ? 8.  DO NOT shower/wash with your normal soap after using and rinsing off the CHG Soap. ?               9.  Pat yourself dry with  a clean towel. ?           10.  Wear clean pajamas. ?           11.  Place clean sheets on your bed the night of your first shower and do not  sleep with pets. ?Day of Surgery : ?Do not apply any lotions/deodorants the morning of surgery.  Please wear clean clothes to the hospital/surgery center. ? ?FAILURE TO FOLLOW THESE INSTRUCTIONS MAY RESULT IN THE CANCELLATION OF YOUR SURGERY ? ?PATIENT SIGNATURE_________________________________ ? ?NURSE SIGNATURE__________________________________ ? ?________________________________________________________________________  ? ?Incentive Spirometer ? ?An incentive spirometer is a tool that can help keep your lungs clear and active. This tool measures how well you are filling your lungs with each breath. Taking long deep breaths may help reverse or decrease the chance of developing breathing (pulmonary) problems (especially infection) following: ?A long period of time when you are unable to move or be active. ?BEFORE THE PROCEDURE  ?If the spirometer includes an  indicator to show your best effort, your nurse or respiratory therapist will set it to a desired goal. ?If possible, sit up straight or lean slightly forward. Try not to slouch. ?Hold the incentive spirometer in an upright position. ?INSTRUCTIONS FOR USE  ?Sit on the edge of your bed if possible, or sit up as far as you can in bed or on a chair. ?Hold the incentive spirometer in an upright position. ?Breathe out normally. ?Place the mouthpiece in your mouth and seal your lips tightly around it. ?Breathe in slowly and as deeply as possible, raising the piston or the ball toward the top of the column. ?Hold your breath for 3-5 seconds or for as long as possible. Allow the piston or ball to fall to the bottom of the column. ?Remove the mouthpiece from your mouth and breathe out normally. ?Rest for a few seconds and repeat Steps 1 through 7 at least 10 times every 1-2 hours when you are awake. Take your time and take a few normal breaths between deep breaths. ?The spirometer may include an indicator to show your best effort. Use the indicator as a goal to work toward during each repetition. ?After each set of 10 deep breaths, practice coughing to be sure your lungs are clear. If you have an incision (the cut made at the time of surgery), support your incision when coughing by placing a pillow or rolled up towels firmly against it. ?Once you are able to get out of bed, walk around indoors and cough well. You may stop using the incentive spirometer when instructed by your caregiver.  ?RISKS AND COMPLICATIONS ?Take your time so you do not get dizzy or light-headed. ?If you are in pain, you may need to take or ask for pain medication before doing incentive spirometry. It is harder to take a deep breath if you are having pain. ?AFTER USE ?Rest and breathe slowly and easily. ?It can be helpful to keep track of a log of your progress. Your caregiver can provide you with a simple table to help with this. ?If you are using the  spirometer at home, follow these instructions: ?SEEK MEDICAL CARE IF:  ?You are having difficultly using the spirometer. ?You have trouble using the spirometer as often as instructed. ?Your pain medication is n

## 2021-04-16 NOTE — Progress Notes (Addendum)
COVID Vaccine Completed: yes x3 ?Date COVID Vaccine completed: 03/21/19, 04/23/19 ?Has received booster: 01/08/20 ?COVID vaccine manufacturer: [employer]  ? ?Date of COVID positive in last 90 days: no ? ?PCP - Tedra Senegal, MD ?Cardiologist - n/a ? ?Chest x-ray - CT 02/10/21 Epic ?EKG - n/a ?Stress Test - early 2000s per pt ?ECHO - n/a ?Cardiac Cath - n/a ?Pacemaker/ICD device last checked: n/a ?Spinal Cord Stimulator: n/a ? ?Bowel Prep - no ? ?Sleep Study - yes, positive ?CPAP - yes, every night ? ?Fasting Blood Sugar - n/a ?Checks Blood Sugar _____ times a day ? ?Blood Thinner Instructions: n/a ?Aspirin Instructions: ?Last Dose: ? ?Activity level: Can go up a flight of stairs and perform activities of daily living without stopping and without symptoms of chest pain or shortness of breath. ?    ? ?Anesthesia review:  ? ?Patient denies shortness of breath, fever, cough and chest pain at PAT appointment ? ? ?Patient verbalized understanding of instructions that were given to them at the PAT appointment. Patient was also instructed that they will need to review over the PAT instructions again at home before surgery.  ?

## 2021-04-19 ENCOUNTER — Encounter (HOSPITAL_COMMUNITY)
Admission: RE | Admit: 2021-04-19 | Discharge: 2021-04-19 | Disposition: A | Discharge status: Home / Self Care | Payer: Medicare Other | Source: Ambulatory Visit | Attending: Orthopaedic Surgery | Admitting: Orthopaedic Surgery

## 2021-04-19 ENCOUNTER — Encounter (HOSPITAL_COMMUNITY): Payer: Self-pay

## 2021-04-19 VITALS — BP 135/95 | HR 88 | Temp 98.1°F | Resp 16 | Ht 71.0 in | Wt 190.0 lb

## 2021-04-19 DIAGNOSIS — M1612 Unilateral primary osteoarthritis, left hip: Secondary | ICD-10-CM | POA: Insufficient documentation

## 2021-04-19 DIAGNOSIS — Z01812 Encounter for preprocedural laboratory examination: Secondary | ICD-10-CM | POA: Diagnosis present

## 2021-04-19 DIAGNOSIS — Z01818 Encounter for other preprocedural examination: Secondary | ICD-10-CM

## 2021-04-19 HISTORY — DX: Malignant (primary) neoplasm, unspecified: C80.1

## 2021-04-19 LAB — CBC
HCT: 40.2 % (ref 39.0–52.0)
Hemoglobin: 13.5 g/dL (ref 13.0–17.0)
MCH: 32.6 pg (ref 26.0–34.0)
MCHC: 33.6 g/dL (ref 30.0–36.0)
MCV: 97.1 fL (ref 80.0–100.0)
Platelets: 330 10*3/uL (ref 150–400)
RBC: 4.14 MIL/uL — ABNORMAL LOW (ref 4.22–5.81)
RDW: 12.2 % (ref 11.5–15.5)
WBC: 6.6 10*3/uL (ref 4.0–10.5)
nRBC: 0 % (ref 0.0–0.2)

## 2021-04-19 LAB — SURGICAL PCR SCREEN
MRSA, PCR: NEGATIVE
Staphylococcus aureus: NEGATIVE

## 2021-04-19 LAB — TYPE AND SCREEN
ABO/RH(D): O POS
Antibody Screen: NEGATIVE

## 2021-04-20 ENCOUNTER — Other Ambulatory Visit: Payer: Self-pay | Admitting: Internal Medicine

## 2021-04-24 ENCOUNTER — Other Ambulatory Visit: Payer: Self-pay | Admitting: Internal Medicine

## 2021-04-27 ENCOUNTER — Other Ambulatory Visit: Payer: Self-pay | Admitting: Internal Medicine

## 2021-04-27 ENCOUNTER — Telehealth: Payer: Self-pay | Admitting: Internal Medicine

## 2021-04-27 MED ORDER — MESALAMINE 1000 MG RE SUPP: 1000.0000 mg | Freq: Every day | RECTAL | 1 refills | Status: DC

## 2021-04-27 NOTE — Telephone Encounter (Signed)
Inbound call from patient seeking advice if Dr. Hilarie Fredrickson would be able to refill his [place] medication. Please advise.  ?

## 2021-04-27 NOTE — Telephone Encounter (Signed)
Rx sent 

## 2021-04-29 ENCOUNTER — Telehealth: Payer: Self-pay | Admitting: *Deleted

## 2021-04-29 DIAGNOSIS — M1612 Unilateral primary osteoarthritis, left hip: Secondary | ICD-10-CM

## 2021-04-29 NOTE — H&P (Signed)
TOTAL HIP ADMISSION H&P ? ?Patient is admitted for left total hip arthroplasty. ? ?Subjective: ? ?Chief Complaint: left hip pain ? ?HPI: James [name], 66 y.o. male, has a history of pain and functional disability in the left hip(s) due to arthritis and patient has failed non-surgical conservative treatments for greater than 12 weeks to include NSAID's and/or analgesics, corticosteriod injections, flexibility and strengthening excercises, use of assistive devices, and activity modification.  Onset of symptoms was gradual starting 2 years ago with gradually worsening course since that time.The patient noted no past surgery on the left hip(s).  Patient currently rates pain in the left hip at 10 out of 10 with activity. Patient has night pain, worsening of pain with activity and weight bearing, pain that interfers with activities of daily living, and pain with passive range of motion. Patient has evidence of subchondral sclerosis, periarticular osteophytes, and joint space narrowing by imaging studies. This condition presents safety issues increasing the risk of falls.  There is no current active infection. ? ?Patient Active Problem List  ? Diagnosis Date Noted  ? Unilateral primary osteoarthritis, left hip 04/29/2021  ? CSR (central serous retinopathy) 03/28/2019  ? Macular pucker, right eye 03/28/2019  ? Status post arthroscopic knee surgery 08/09/2017  ? Unilateral primary osteoarthritis, left knee 04/10/2017  ? Status post arthroscopy of left knee 03/23/2017  ? Barrett's esophagus 05/30/2016  ? OSA (obstructive sleep apnea) 12/23/2015  ? Exertional dyspnea 12/23/2015  ? Status post total replacement of right hip 03/14/2014  ? Osteoarthritis of right hip 03/04/2014  ? Allergic rhinitis 03/04/2014  ? History of adenomatous polyp of colon 08/19/2011  ? Left sided colitis ([place]) 08/19/2011  ? GE reflux 11/14/2010  ? Low back pain 11/14/2010  ? History of smoking 11/14/2010  ? Hyperlipidemia 11/14/2010  ? ?Past  Medical History:  ?Diagnosis Date  ? Anemia   ? Aortic atherosclerosis ([place])   ? Cancer [facility])   ? skin  ? Colitis   ? Degenerative disc disease   ? Diverticulosis 2013  ? Colonoscopy  ? GERD (gastroesophageal reflux disease)   ? Hiatal hernia   ? Hx of colonic polyps 2013  ? Colonoscopy- tubular adenoma, and hyperplastic   ? Hyperlipidemia   ? Internal hemorrhoids 2013  ? Colonoscopy  ? Lower back pain   ? Right inguinal hernia   ? Sleep apnea 2015  ? on CPAP  ? Tubular adenoma   ?  ?Past Surgical History:  ?Procedure Laterality Date  ? COLONOSCOPY    ? HERNIA REPAIR  1980  ? left inguinal  ? KNEE ARTHROSCOPY  2018  ? TOTAL HIP ARTHROPLASTY Right 03/14/2014  ? Procedure: RIGHT TOTAL HIP ARTHROPLASTY ANTERIOR APPROACH ;  Surgeon: Mcarthur Rossetti, MD;  Location: [facility];  Service: Orthopedics;  Laterality: Right;  ? WISDOM TOOTH EXTRACTION    ?  ?No current facility-administered medications for this encounter.  ? ?Current Outpatient Medications  ?Medication Sig Dispense Refill Last Dose  ? acetaminophen-codeine (TYLENOL #3) 300-30 MG tablet Take 1-2 tablets by mouth every 8 (eight) hours as needed. 30 tablet 0   ? ALPRAZolam (XANAX) 0.25 MG tablet TAKE 1 TABLET(0.25 MG) BY MOUTH TWICE DAILY AS NEEDED FOR ANXIETY 60 tablet 5   ? Ascorbic Acid (VITAMIN C PO) Take 1 tablet by mouth daily.     ? cholecalciferol (VITAMIN D3) 25 MCG (1000 UNIT) tablet Take 1,000 Units by mouth daily.     ? ibuprofen (ADVIL) 800 MG tablet TAKE 1  TABLET(800 MG) BY MOUTH EVERY 8 HOURS AS NEEDED 60 tablet 1   ? Multiple Vitamin (MULTIVITAMIN) tablet Take 1 tablet by mouth daily.     ? oxymetazoline (AFRIN) 0.05 % nasal spray Place 4 sprays into both nostrils at bedtime.     ? pantoprazole (PROTONIX) 40 MG tablet Take 1 tablet (40 mg total) by mouth daily. 90 tablet 3   ? polyethylene glycol powder (GLYCOLAX/MIRALAX) 17 GM/SCOOP powder Take 255 g by mouth 2 (two) times daily. (Patient taking differently: Take 17 g by mouth daily.) 850 g  prn   ? tiZANidine (ZANAFLEX) 4 MG tablet Take 1 tablet (4 mg total) by mouth every 8 (eight) hours as needed for muscle spasms. 30 tablet 1   ? mesalamine (CANASA) 1000 MG suppository UNWRAP AND INSERT 1 SUPPOSITORY(1000 MG) RECTALLY AT BEDTIME 90 suppository 0   ? mesalamine (LIALDA) 1.2 g EC tablet TAKE 4 TABLETS(4.8 GRAMS) BY MOUTH DAILY WITH BREAKFAST 360 tablet 0   ? rosuvastatin (CRESTOR) 5 MG tablet TAKE 1 TABLET BY MOUTH EVERY DAY 90 tablet 1   ? ?Allergies  ?Allergen Reactions  ? Prednisone   ?  ?Social History  ? ?Tobacco Use  ? Smoking status: Former  ?  Packs/day: 0.25  ?  Years: 30.00  ?  Pack years: 7.50  ?  Types: Cigarettes  ?  Quit date: 03/09/2014  ?  Years since quitting: 7.1  ? Smokeless tobacco: Never  ? Tobacco comments:  ?  Counseling sheet given 08-2011  ?Substance Use Topics  ? Alcohol use: Not Currently  ?  Alcohol/week: 10.0 standard drinks  ?  Types: 10 Glasses of wine per week  ?  Comment: week  ?  ?Family History  ?Problem Relation Age of Onset  ? Colon cancer Mother   ?     late 49s  ? Heart disease Father   ? Colon cancer Father 68  ? Esophageal cancer Father   ?     late 46's  ? Stomach cancer Neg Hx   ? AAA (abdominal aortic aneurysm) Neg Hx   ? Rectal cancer Neg Hx   ? Colon polyps Neg Hx   ?  ? ?Review of Systems  ?Musculoskeletal:  Positive for gait problem.  ?All other systems reviewed and are negative. ? ?Objective: ? ?Physical Exam ?Vitals reviewed.  ?Constitutional:   ?   Appearance: Normal appearance.  ?HENT:  ?   Head: Normocephalic and atraumatic.  ?Eyes:  ?   Extraocular Movements: Extraocular movements intact.  ?   Pupils: Pupils are equal, round, and reactive to light.  ?Cardiovascular:  ?   Rate and Rhythm: Normal rate and regular rhythm.  ?Pulmonary:  ?   Effort: Pulmonary effort is normal.  ?   Breath sounds: Normal breath sounds.  ?Abdominal:  ?   General: Abdomen is flat.  ?   Palpations: Abdomen is soft.  ?Musculoskeletal:  ?   Cervical back: Normal range of motion  and neck supple.  ?   Left hip: Tenderness and bony tenderness present. Decreased range of motion. Decreased strength.  ?Neurological:  ?   Mental Status: He is alert and oriented to person, place, and time.  ?Psychiatric:     ?   Behavior: Behavior normal.  ? ? ?Vital signs in last 24 hours: ?  ? ?Labs: ? ? ?Estimated body mass index is 26.5 kg/m? as calculated from the following: ?  Height as of 04/19/21: 5' 11"  (1.803 m). ?  Weight as of 04/19/21: 86.2 kg. ? ? ?Imaging Review ?Plain radiographs demonstrate severe degenerative joint disease of the left hip(s). The bone quality appears to be good for age and reported activity level. ? ? ? ? ? ?Assessment/Plan: ? ?End stage arthritis, left hip(s) ? ?The patient history, physical examination, clinical judgement of the provider and imaging studies are consistent with end stage degenerative joint disease of the left hip(s) and total hip arthroplasty is deemed medically necessary. The treatment options including medical management, injection therapy, arthroscopy and arthroplasty were discussed at length. The risks and benefits of total hip arthroplasty were presented and reviewed. The risks due to aseptic loosening, infection, stiffness, dislocation/subluxation,  thromboembolic complications and other imponderables were discussed.  The patient acknowledged the explanation, agreed to proceed with the plan and consent was signed. Patient is being admitted for inpatient treatment for surgery, pain control, PT, OT, prophylactic antibiotics, VTE prophylaxis, progressive ambulation and ADL's and discharge planning.The patient is planning to be discharged home with home health services ? ? ? ?

## 2021-04-29 NOTE — Care Plan (Signed)
[facility] RNCM call to patient to discuss his upcoming Left total hip arthroplasty with Dr. Ninfa Linden on 04/30/21. He is an Ortho bundle patient through [facility] and is agreeable to case management. He lives with his spouse, who he is a CG for, but has two daughters that are coming to assist with needs. He will need a RW and has a 3in1/BSC. This will be ordered through [facility] to be delivered to him prior to discharge. May require assistance from hospital CM/SW over weekend to ensure he receives prior to discharge. Anticipate HHPT will be needed after short hospital stay. Referral made to [facility] after choice provided. Reviewed all post op care instructions. Will continue to follow for needs. ?

## 2021-04-29 NOTE — Telephone Encounter (Signed)
Ortho bundle Pre op call completed. ?

## 2021-04-30 ENCOUNTER — Other Ambulatory Visit: Payer: Self-pay

## 2021-04-30 ENCOUNTER — Encounter (HOSPITAL_COMMUNITY)
Admission: RE | Disposition: A | Discharge status: Home Health | Payer: Self-pay | Source: Ambulatory Visit | Attending: Orthopaedic Surgery

## 2021-04-30 ENCOUNTER — Observation Stay (HOSPITAL_COMMUNITY): Payer: Medicare Other

## 2021-04-30 ENCOUNTER — Ambulatory Visit (HOSPITAL_COMMUNITY): Payer: Medicare Other | Admitting: Anesthesiology

## 2021-04-30 ENCOUNTER — Observation Stay (HOSPITAL_COMMUNITY)
Admission: RE | Admit: 2021-04-30 | Discharge: 2021-05-01 | Disposition: A | Discharge status: Home Health | Payer: Medicare Other | Source: Ambulatory Visit | Attending: Orthopaedic Surgery | Admitting: Orthopaedic Surgery

## 2021-04-30 ENCOUNTER — Ambulatory Visit (HOSPITAL_BASED_OUTPATIENT_CLINIC_OR_DEPARTMENT_OTHER): Payer: Medicare Other | Admitting: Anesthesiology

## 2021-04-30 ENCOUNTER — Encounter (HOSPITAL_COMMUNITY): Payer: Self-pay | Admitting: Orthopaedic Surgery

## 2021-04-30 ENCOUNTER — Ambulatory Visit (HOSPITAL_COMMUNITY): Payer: Medicare Other

## 2021-04-30 DIAGNOSIS — M1612 Unilateral primary osteoarthritis, left hip: Principal | ICD-10-CM

## 2021-04-30 DIAGNOSIS — Z87891 Personal history of nicotine dependence: Secondary | ICD-10-CM | POA: Diagnosis not present

## 2021-04-30 DIAGNOSIS — Z96642 Presence of left artificial hip joint: Secondary | ICD-10-CM

## 2021-04-30 DIAGNOSIS — Z79899 Other long term (current) drug therapy: Secondary | ICD-10-CM | POA: Insufficient documentation

## 2021-04-30 DIAGNOSIS — Z85828 Personal history of other malignant neoplasm of skin: Secondary | ICD-10-CM | POA: Diagnosis not present

## 2021-04-30 HISTORY — PX: TOTAL HIP ARTHROPLASTY: SHX124

## 2021-04-30 SURGERY — ARTHROPLASTY, HIP, TOTAL, ANTERIOR APPROACH
Anesthesia: Monitor Anesthesia Care | Site: Hip | Laterality: Left

## 2021-04-30 MED ORDER — DOCUSATE SODIUM 100 MG PO CAPS
100.0000 mg | ORAL_CAPSULE | Freq: Two times a day (BID) | ORAL | Status: DC
  Administered 2021-04-30: 100 mg via ORAL
  Filled 2021-04-30 (×2): qty 1

## 2021-04-30 MED ORDER — POLYETHYLENE GLYCOL 3350 17 G PO PACK: 17.0000 g | PACK | Freq: Every day | ORAL | Status: DC | PRN

## 2021-04-30 MED ORDER — CHLORHEXIDINE GLUCONATE 0.12 % MT SOLN
15.0000 mL | Freq: Once | OROMUCOSAL | Status: AC
  Administered 2021-04-30: 15 mL via OROMUCOSAL

## 2021-04-30 MED ORDER — 0.9 % SODIUM CHLORIDE (POUR BTL) OPTIME
TOPICAL | Status: DC | PRN
  Administered 2021-04-30: 1000 mL

## 2021-04-30 MED ORDER — METOCLOPRAMIDE HCL 5 MG PO TABS: 5.0000 mg | ORAL_TABLET | Freq: Three times a day (TID) | ORAL | Status: DC | PRN

## 2021-04-30 MED ORDER — ONDANSETRON HCL 4 MG/2ML IJ SOLN
INTRAMUSCULAR | Status: AC
  Filled 2021-04-30: qty 2

## 2021-04-30 MED ORDER — ADULT MULTIVITAMIN W/MINERALS CH
1.0000 | ORAL_TABLET | Freq: Every day | ORAL | Status: DC
  Administered 2021-04-30 – 2021-05-01 (×2): 1 via ORAL
  Filled 2021-04-30 (×2): qty 1

## 2021-04-30 MED ORDER — OXYCODONE HCL 5 MG PO TABS
5.0000 mg | ORAL_TABLET | ORAL | Status: DC | PRN
  Administered 2021-04-30: 10 mg via ORAL
  Filled 2021-04-30: qty 2

## 2021-04-30 MED ORDER — CEFAZOLIN SODIUM-DEXTROSE 2-4 GM/100ML-% IV SOLN
2.0000 g | INTRAVENOUS | Status: AC
  Administered 2021-04-30: 2 g via INTRAVENOUS
  Filled 2021-04-30: qty 100

## 2021-04-30 MED ORDER — METHOCARBAMOL 1000 MG/10ML IJ SOLN
500.0000 mg | Freq: Four times a day (QID) | INTRAVENOUS | Status: DC | PRN
  Filled 2021-04-30: qty 5

## 2021-04-30 MED ORDER — LACTATED RINGERS IV SOLN
INTRAVENOUS | Status: DC
Start: 2021-04-30 — End: 2021-04-30

## 2021-04-30 MED ORDER — POVIDONE-IODINE 10 % EX SWAB
2.0000 "application " | Freq: Once | CUTANEOUS | Status: AC
  Administered 2021-04-30: 2 via TOPICAL

## 2021-04-30 MED ORDER — MENTHOL 3 MG MT LOZG: 1.0000 | LOZENGE | OROMUCOSAL | Status: DC | PRN

## 2021-04-30 MED ORDER — METHOCARBAMOL 500 MG PO TABS
500.0000 mg | ORAL_TABLET | Freq: Four times a day (QID) | ORAL | Status: DC | PRN
  Administered 2021-04-30 – 2021-05-01 (×2): 500 mg via ORAL
  Filled 2021-04-30 (×2): qty 1

## 2021-04-30 MED ORDER — OXYCODONE HCL 5 MG PO TABS
10.0000 mg | ORAL_TABLET | ORAL | Status: DC | PRN
  Administered 2021-04-30 – 2021-05-01 (×3): 10 mg via ORAL
  Filled 2021-04-30 (×2): qty 2

## 2021-04-30 MED ORDER — HYDROMORPHONE HCL 1 MG/ML IJ SOLN
0.5000 mg | INTRAMUSCULAR | Status: DC | PRN
  Administered 2021-04-30 – 2021-05-01 (×2): 0.5 mg via INTRAVENOUS
  Filled 2021-04-30 (×2): qty 1

## 2021-04-30 MED ORDER — VITAMIN D 25 MCG (1000 UNIT) PO TABS
1000.0000 [IU] | ORAL_TABLET | Freq: Every day | ORAL | Status: DC
  Administered 2021-04-30 – 2021-05-01 (×2): 1000 [IU] via ORAL
  Filled 2021-04-30 (×2): qty 1

## 2021-04-30 MED ORDER — PROPOFOL 10 MG/ML IV BOLUS
INTRAVENOUS | Status: DC | PRN
  Administered 2021-04-30 (×6): 20 mg via INTRAVENOUS

## 2021-04-30 MED ORDER — DEXAMETHASONE SODIUM PHOSPHATE 10 MG/ML IJ SOLN
INTRAMUSCULAR | Status: AC
  Filled 2021-04-30: qty 1

## 2021-04-30 MED ORDER — ORAL CARE MOUTH RINSE: 15.0000 mL | Freq: Once | OROMUCOSAL | Status: AC

## 2021-04-30 MED ORDER — FENTANYL CITRATE PF 50 MCG/ML IJ SOSY: 25.0000 ug | PREFILLED_SYRINGE | INTRAMUSCULAR | Status: DC | PRN

## 2021-04-30 MED ORDER — OXYCODONE HCL 5 MG PO TABS
ORAL_TABLET | ORAL | Status: AC
  Filled 2021-04-30: qty 2

## 2021-04-30 MED ORDER — PANTOPRAZOLE SODIUM 40 MG PO TBEC
40.0000 mg | DELAYED_RELEASE_TABLET | Freq: Every day | ORAL | Status: DC
  Administered 2021-04-30 – 2021-05-01 (×2): 40 mg via ORAL
  Filled 2021-04-30 (×2): qty 1

## 2021-04-30 MED ORDER — PROPOFOL 10 MG/ML IV BOLUS
INTRAVENOUS | Status: AC
  Filled 2021-04-30: qty 20

## 2021-04-30 MED ORDER — ONDANSETRON HCL 4 MG/2ML IJ SOLN: 4.0000 mg | Freq: Four times a day (QID) | INTRAMUSCULAR | Status: DC | PRN

## 2021-04-30 MED ORDER — PROPOFOL 500 MG/50ML IV EMUL
INTRAVENOUS | Status: DC | PRN
Start: 2021-04-30 — End: 2021-04-30
  Administered 2021-04-30: 50 ug/kg/min via INTRAVENOUS

## 2021-04-30 MED ORDER — HYDROMORPHONE HCL 2 MG PO TABS
1.0000 mg | ORAL_TABLET | ORAL | Status: DC | PRN
  Administered 2021-05-01: 2 mg via ORAL

## 2021-04-30 MED ORDER — PHENOL 1.4 % MT LIQD: 1.0000 | OROMUCOSAL | Status: DC | PRN

## 2021-04-30 MED ORDER — HYDROMORPHONE HCL 2 MG PO TABS
2.0000 mg | ORAL_TABLET | ORAL | Status: DC | PRN
  Filled 2021-04-30: qty 1

## 2021-04-30 MED ORDER — ALUM & MAG HYDROXIDE-SIMETH 200-200-20 MG/5ML PO SUSP: 30.0000 mL | ORAL | Status: DC | PRN

## 2021-04-30 MED ORDER — METOCLOPRAMIDE HCL 5 MG/ML IJ SOLN: 5.0000 mg | Freq: Three times a day (TID) | INTRAMUSCULAR | Status: DC | PRN

## 2021-04-30 MED ORDER — ROSUVASTATIN CALCIUM 5 MG PO TABS
5.0000 mg | ORAL_TABLET | Freq: Every day | ORAL | Status: DC
Start: 2021-04-30 — End: 2021-05-01
  Administered 2021-04-30 – 2021-05-01 (×2): 5 mg via ORAL
  Filled 2021-04-30 (×2): qty 1

## 2021-04-30 MED ORDER — ONDANSETRON HCL 4 MG/2ML IJ SOLN
INTRAMUSCULAR | Status: DC | PRN
  Administered 2021-04-30: 4 mg via INTRAVENOUS

## 2021-04-30 MED ORDER — FENTANYL CITRATE (PF) 100 MCG/2ML IJ SOLN
INTRAMUSCULAR | Status: AC
  Filled 2021-04-30: qty 2

## 2021-04-30 MED ORDER — DIPHENHYDRAMINE HCL 12.5 MG/5ML PO ELIX: 12.5000 mg | ORAL_SOLUTION | ORAL | Status: DC | PRN

## 2021-04-30 MED ORDER — FENTANYL CITRATE (PF) 100 MCG/2ML IJ SOLN
INTRAMUSCULAR | Status: DC | PRN
  Administered 2021-04-30: 100 ug via INTRAVENOUS

## 2021-04-30 MED ORDER — MIDAZOLAM HCL 5 MG/5ML IJ SOLN
INTRAMUSCULAR | Status: DC | PRN
  Administered 2021-04-30: 2 mg via INTRAVENOUS

## 2021-04-30 MED ORDER — ACETAMINOPHEN 325 MG PO TABS: 325.0000 mg | ORAL_TABLET | Freq: Four times a day (QID) | ORAL | Status: DC | PRN

## 2021-04-30 MED ORDER — ASPIRIN 81 MG PO CHEW
81.0000 mg | CHEWABLE_TABLET | Freq: Two times a day (BID) | ORAL | Status: DC
  Administered 2021-04-30 – 2021-05-01 (×2): 81 mg via ORAL
  Filled 2021-04-30 (×2): qty 1

## 2021-04-30 MED ORDER — ASCORBIC ACID 500 MG PO TABS
250.0000 mg | ORAL_TABLET | Freq: Every day | ORAL | Status: DC
  Administered 2021-04-30 – 2021-05-01 (×2): 250 mg via ORAL
  Filled 2021-04-30 (×2): qty 1

## 2021-04-30 MED ORDER — TRANEXAMIC ACID-NACL 1000-0.7 MG/100ML-% IV SOLN
1000.0000 mg | INTRAVENOUS | Status: AC
Start: 2021-04-30 — End: 2021-04-30
  Administered 2021-04-30: 1000 mg via INTRAVENOUS
  Filled 2021-04-30: qty 100

## 2021-04-30 MED ORDER — PHENYLEPHRINE HCL-NACL 20-0.9 MG/250ML-% IV SOLN
INTRAVENOUS | Status: DC | PRN
  Administered 2021-04-30: 50 ug/min via INTRAVENOUS

## 2021-04-30 MED ORDER — SODIUM CHLORIDE 0.9 % IR SOLN
Status: DC | PRN
  Administered 2021-04-30: 1000 mL

## 2021-04-30 MED ORDER — SODIUM CHLORIDE 0.9 % IV SOLN: INTRAVENOUS | Status: DC

## 2021-04-30 MED ORDER — ALPRAZOLAM 0.25 MG PO TABS
0.2500 mg | ORAL_TABLET | Freq: Two times a day (BID) | ORAL | Status: DC | PRN
  Administered 2021-05-01: 0.25 mg via ORAL
  Filled 2021-04-30: qty 1

## 2021-04-30 MED ORDER — BUPIVACAINE IN DEXTROSE 0.75-8.25 % IT SOLN
INTRATHECAL | Status: DC | PRN
  Administered 2021-04-30: 1.8 mL via INTRATHECAL

## 2021-04-30 MED ORDER — ONDANSETRON HCL 4 MG PO TABS: 4.0000 mg | ORAL_TABLET | Freq: Four times a day (QID) | ORAL | Status: DC | PRN

## 2021-04-30 MED ORDER — CEFAZOLIN SODIUM-DEXTROSE 1-4 GM/50ML-% IV SOLN
1.0000 g | Freq: Four times a day (QID) | INTRAVENOUS | Status: AC
  Administered 2021-04-30 – 2021-05-01 (×2): 1 g via INTRAVENOUS
  Filled 2021-04-30 (×2): qty 50

## 2021-04-30 MED ORDER — AMISULPRIDE (ANTIEMETIC) 5 MG/2ML IV SOLN: 10.0000 mg | Freq: Once | INTRAVENOUS | Status: DC | PRN

## 2021-04-30 MED ORDER — MIDAZOLAM HCL 2 MG/2ML IJ SOLN
INTRAMUSCULAR | Status: AC
  Filled 2021-04-30: qty 2

## 2021-04-30 MED ORDER — DEXAMETHASONE SODIUM PHOSPHATE 10 MG/ML IJ SOLN
INTRAMUSCULAR | Status: DC | PRN
  Administered 2021-04-30: 10 mg via INTRAVENOUS

## 2021-04-30 SURGICAL SUPPLY — 45 items
ACETAB CUP W/GRIPTION 54 (Plate) ×2 IMPLANT
APL SKNCLS STERI-STRIP NONHPOA (GAUZE/BANDAGES/DRESSINGS)
BAG COUNTER SPONGE SURGICOUNT (BAG) ×3 IMPLANT
BAG SPEC THK2 15X12 ZIP CLS (MISCELLANEOUS)
BAG SPNG CNTER NS LX DISP (BAG) ×1
BAG ZIPLOCK 12X15 (MISCELLANEOUS) IMPLANT
BENZOIN TINCTURE PRP APPL 2/3 (GAUZE/BANDAGES/DRESSINGS) IMPLANT
BLADE SAW SGTL 18X1.27X75 (BLADE) ×3 IMPLANT
COVER PERINEAL POST (MISCELLANEOUS) ×3 IMPLANT
COVER SURGICAL LIGHT HANDLE (MISCELLANEOUS) ×3 IMPLANT
CUP ACETAB W/GRIPTION 54 (Plate) IMPLANT
DRAPE FOOT SWITCH (DRAPES) ×3 IMPLANT
DRAPE STERI IOBAN 125X83 (DRAPES) ×3 IMPLANT
DRAPE U-SHAPE 47X51 STRL (DRAPES) ×6 IMPLANT
DRSG AQUACEL AG ADV 3.5X10 (GAUZE/BANDAGES/DRESSINGS) ×3 IMPLANT
DURAPREP 26ML APPLICATOR (WOUND CARE) ×3 IMPLANT
ELECT REM PT RETURN 15FT ADLT (MISCELLANEOUS) ×3 IMPLANT
GAUZE XEROFORM 1X8 LF (GAUZE/BANDAGES/DRESSINGS) ×3 IMPLANT
GLOVE BIO SURGEON STRL SZ7.5 (GLOVE) ×3 IMPLANT
GLOVE BIOGEL PI IND STRL 8 (GLOVE) ×4 IMPLANT
GLOVE BIOGEL PI INDICATOR 8 (GLOVE) ×2
GLOVE SKINSENSE NS SZ8.0 LF (GLOVE) ×1
GLOVE SKINSENSE STRL SZ8.0 LF (GLOVE) ×2 IMPLANT
GOWN STRL REUS W/ TWL XL LVL3 (GOWN DISPOSABLE) ×4 IMPLANT
GOWN STRL REUS W/TWL XL LVL3 (GOWN DISPOSABLE) ×4
HANDPIECE INTERPULSE COAX TIP (DISPOSABLE) ×2
HEAD CERAMIC DELTA 36 PLUS 1.5 (Hips) ×1 IMPLANT
HOLDER FOLEY CATH W/STRAP (MISCELLANEOUS) ×3 IMPLANT
KIT TURNOVER KIT A (KITS) IMPLANT
LINER NEUTRAL 54X36MM PLUS 4 (Hips) ×1 IMPLANT
PACK ANTERIOR HIP CUSTOM (KITS) ×3 IMPLANT
SET HNDPC FAN SPRY TIP SCT (DISPOSABLE) ×2 IMPLANT
SPONGE T-LAP 18X18 ~~LOC~~+RFID (SPONGE) ×9 IMPLANT
STAPLER VISISTAT 35W (STAPLE) IMPLANT
STEM CORAIL KA11 (Stem) ×1 IMPLANT
STRIP CLOSURE SKIN 1/2X4 (GAUZE/BANDAGES/DRESSINGS) IMPLANT
SUT ETHIBOND NAB CT1 #1 30IN (SUTURE) ×3 IMPLANT
SUT ETHILON 2 0 PS N (SUTURE) IMPLANT
SUT MNCRL AB 4-0 PS2 18 (SUTURE) IMPLANT
SUT VIC AB 0 CT1 36 (SUTURE) ×3 IMPLANT
SUT VIC AB 1 CT1 36 (SUTURE) ×3 IMPLANT
SUT VIC AB 2-0 CT1 27 (SUTURE) ×4
SUT VIC AB 2-0 CT1 TAPERPNT 27 (SUTURE) ×4 IMPLANT
TRAY FOLEY MTR SLVR 16FR STAT (SET/KITS/TRAYS/PACK) IMPLANT
YANKAUER SUCT BULB TIP NO VENT (SUCTIONS) ×3 IMPLANT

## 2021-04-30 NOTE — Transfer of Care (Signed)
Immediate Anesthesia Transfer of Care Note ? ?Patient: James [name] ? ?Procedure(s) Performed: LEFT TOTAL HIP ARTHROPLASTY ANTERIOR APPROACH (Left: Hip) ? ?Patient Location: PACU ? ?Anesthesia Type:Spinal ? ?Level of Consciousness: awake and alert  ? ?Airway & Oxygen Therapy: Patient Spontanous Breathing and Patient connected to face mask oxygen ? ?Post-op Assessment: Report given to RN and Post -op Vital signs reviewed and stable ? ?Post vital signs: Reviewed and stable ? ?Last Vitals:  ?Vitals Value Taken Time  ?BP 108/67 04/30/21 1345  ?Temp    ?Pulse 66 04/30/21 1346  ?Resp 13 04/30/21 1346  ?SpO2 100 % 04/30/21 1346  ?Vitals shown include unvalidated device data. ? ?Last Pain:  ?Vitals:  ? 04/30/21 1008  ?TempSrc: Oral  ?   ? ?  ? ?Complications: No notable events documented. ?

## 2021-04-30 NOTE — Anesthesia Procedure Notes (Signed)
Spinal ? ?Patient location during procedure: OR ?Start time: 04/30/2021 12:18 PM ?End time: 04/30/2021 12:26 PM ?Reason for block: surgical anesthesia ?Staffing ?Performed: anesthesiologist  ?Anesthesiologist: Duane Boston, MD ?Preanesthetic Checklist ?Completed: patient identified, IV checked, risks and benefits discussed, surgical consent, monitors and equipment checked, pre-op evaluation and timeout performed ?Spinal Block ?Patient position: sitting ?Prep: DuraPrep ?Patient monitoring: cardiac monitor, continuous pulse ox and blood pressure ?Approach: midline ?Location: L2-3 ?Injection technique: single-shot ?Needle ?Needle type: Pencan  ?Needle gauge: 24 G ?Needle length: 9 cm ?Assessment ?Events: CSF return ?Additional Notes ?Functioning IV was confirmed and monitors were applied. Sterile prep and drape, including hand hygiene and sterile gloves were used. The patient was positioned and the spine was prepped. The skin was anesthetized with lidocaine.  Free flow of clear CSF was obtained prior to injecting local anesthetic into the CSF.  The spinal needle aspirated freely following injection.  The needle was carefully withdrawn.  The patient tolerated the procedure well.  ? ? ? ?

## 2021-04-30 NOTE — Plan of Care (Signed)

## 2021-04-30 NOTE — Brief Op Note (Signed)
04/30/2021 ? ?1:27 PM ? ?PATIENT:  James [name]  65 y.o. male ? ?PRE-OPERATIVE DIAGNOSIS:  OSTEOARTHRITIS / DEGENERATIVE JOINT DISEASE LEFT HIP ? ?POST-OPERATIVE DIAGNOSIS:  OSTEOARTHRITIS / DEGENERATIVE JOINT DISEASE LEFT HIP ? ?PROCEDURE:  Procedure(s): ?LEFT TOTAL HIP ARTHROPLASTY ANTERIOR APPROACH (Left) ? ?SURGEON:  Surgeon(s) and Role: ?   Mcarthur Rossetti, MD - Primary ? ?PHYSICIAN ASSISTANT:  Benita Stabile, PA-C ? ?ANESTHESIA:   spinal ? ?EBL:  200 mL  ? ?COUNTS:  YES ? ?DICTATION: .Other Dictation: Dictation Number [id number] ? ?PLAN OF CARE: Admit for overnight observation ? ?PATIENT DISPOSITION:  PACU - hemodynamically stable. ?  ?Delay start of Pharmacological VTE agent (>24hrs) due to surgical blood loss or risk of bleeding: no ? ?

## 2021-04-30 NOTE — Evaluation (Signed)
Physical Therapy Evaluation ?Patient Details ?Name: James [name] ?MRN: [id number] ?DOB: Sep 27, 1955 ?Today's Date: 04/30/2021 ? ?History of Present Illness ? Pt is a 66yo male presenting s/p L-THA, anterior approach on 04/30/21. PMH: OA, GERD, hyperlipidemia, R-THA 2016  ?Clinical Impression ? James [name] is a 66 y.o. male POD 0 s/p L-THA, anterior approach. Patient reports modified independence with mobility at baseline. Patient is now limited by functional impairments (see PT problem list below) and requires min guard for transfers and gait with RW. Patient was able to ambulate 60 feet with RW and min guard assist. Patient instructed in exercise to facilitate ROM and circulation to manage edema. Patient will benefit from continued skilled PT interventions to address impairments and progress towards PLOF. Acute PT will follow to progress mobility and stair training in preparation for safe discharge home.   ?   ? ?Recommendations for follow up therapy are one component of a multi-disciplinary discharge planning process, led by the attending physician.  Recommendations may be updated based on patient status, additional functional criteria and insurance authorization. ? ?Follow Up Recommendations Follow physician's recommendations for discharge plan and follow up therapies ? ?  ?Assistance Recommended at Discharge Set up Supervision/Assistance  ?Patient can return home with the following ? A little help with walking and/or transfers;A little help with bathing/dressing/bathroom;Assistance with cooking/housework;Assist for transportation;Help with stairs or ramp for entrance ? ?  ?Equipment Recommendations Rolling walker (2 wheels)  ?Recommendations for Other Services ?    ?  ?Functional Status Assessment Patient has had a recent decline in their functional status and demonstrates the ability to make significant improvements in function in a reasonable and predictable amount of time.  ? ?  ?Precautions /  Restrictions Precautions ?Precautions: Fall ?Restrictions ?Weight Bearing Restrictions: No ?LLE Weight Bearing: Weight bearing as tolerated  ? ?  ? ?Mobility ? Bed Mobility ?Overal bed mobility: Needs Assistance ?Bed Mobility: Supine to Sit ?  ?  ?Supine to sit: Supervision ?  ?  ?General bed mobility comments: Pt supervision for safety only. ?  ? ?Transfers ?Overall transfer level: Needs assistance ?Equipment used: Rolling walker (2 wheels) ?Transfers: Sit to/from Stand ?Sit to Stand: Min guard ?  ?  ?  ?  ?  ?General transfer comment: Pt min guard for safety only, no physical assist required ?  ? ?Ambulation/Gait ?Ambulation/Gait assistance: Min guard ?Gait Distance (Feet): 60 Feet ?Assistive device: Rolling walker (2 wheels) ?Gait Pattern/deviations: Step-through pattern, Decreased stance time - left ?Gait velocity: decreased ?  ?  ?General Gait Details: Pt ambulated with min guard assist and recliner follow for safety, no physical assist required. Demonstrated step-through pattern and requested pt reduce speed and pt was able to decrease step length. Pt demonstrated decreased stance time on LLE. No overt LOB noted ? ?Stairs ?  ?  ?  ?  ?  ? ?Wheelchair Mobility ?  ? ?Modified Rankin (Stroke Patients Only) ?  ? ?  ? ?Balance Overall balance assessment: Needs assistance ?Sitting-balance support: Feet supported, No upper extremity supported ?Sitting balance-Leahy Scale: Fair ?  ?  ?Standing balance support: Bilateral upper extremity supported, During functional activity, Reliant on assistive device for balance ?Standing balance-Leahy Scale: Poor ?  ?  ?  ?  ?  ?  ?  ?  ?  ?  ?  ?  ?   ? ? ? ?Pertinent Vitals/Pain Pain Assessment ?Pain Assessment: 0-10 ?Pain Score: 5  ?Pain Location: left hip and lower back ?Pain Descriptors /  Indicators: Operative site guarding, Discomfort ?Pain Intervention(s): Limited activity within patient's tolerance, Monitored during session, RN gave pain meds during session, Repositioned,  Ice applied  ? ? ?Home Living Family/patient expects to be discharged to:: Private residence ?Living Arrangements: Spouse/significant other (Wife has stage IV cancer) ?Available Help at Discharge: Family;Available 24 hours/day (Son and Daughter) ?Type of Home: House ?Home Access: Stairs to enter ?Entrance Stairs-Rails: None ?Entrance Stairs-Number of Steps: 3 ?Alternate Level Stairs-Number of Steps: 15 ?Home Layout: Two level;Bed/bath upstairs ?Home Equipment: BSC/3in1;Cane - single point;Shower seat ?Additional Comments: Pt cares for wife with cancer, children will stay with and care for pt and wife.  ?  ?Prior Function Prior Level of Function : Independent/Modified Independent ?  ?  ?  ?  ?  ?  ?Mobility Comments: SPC for static standing and long distances ?ADLs Comments: IND ?  ? ? ?Hand Dominance  ?   ? ?  ?Extremity/Trunk Assessment  ? Upper Extremity Assessment ?Upper Extremity Assessment: Overall WFL for tasks assessed ?  ? ?Lower Extremity Assessment ?Lower Extremity Assessment: RLE deficits/detail;LLE deficits/detail ?RLE Deficits / Details: MMT ank df/pf 5/5 ?RLE Sensation: WNL ?LLE Deficits / Details: MMT ank df/pf 5/5 ?LLE Sensation: WNL ?  ? ?Cervical / Trunk Assessment ?Cervical / Trunk Assessment: Normal  ?Communication  ? Communication: No difficulties  ?Cognition Arousal/Alertness: Awake/alert ?Behavior During Therapy: [facility] for tasks assessed/performed ?Overall Cognitive Status: Within Functional Limits for tasks assessed ?  ?  ?  ?  ?  ?  ?  ?  ?  ?  ?  ?  ?  ?  ?  ?  ?  ?  ?  ? ?  ?General Comments   ? ?  ?Exercises Total Joint Exercises ?Ankle Circles/Pumps: AROM, 20 reps, Both ?Quad Sets: AROM, Left, 5 reps  ? ?Assessment/Plan  ?  ?PT Assessment Patient needs continued PT services  ?PT Problem List Decreased strength;Decreased range of motion;Decreased activity tolerance;Decreased balance;Decreased mobility;Decreased coordination;Pain ? ?   ?  ?PT Treatment Interventions DME instruction;Gait  training;Stair training;Functional mobility training;Therapeutic activities;Therapeutic exercise;Balance training;Neuromuscular re-education;Patient/family education   ? ?PT Goals (Current goals can be found in the Care Plan section)  ?Acute Rehab PT Goals ?Patient Stated Goal: To walk further without pain ?PT Goal Formulation: With patient ?Time For Goal Achievement: 05/07/21 ?Potential to Achieve Goals: Good ? ?  ?Frequency 7X/week ?  ? ? ?Co-evaluation   ?  ?  ?  ?  ? ? ?  ?AM-PAC PT "6 Clicks" Mobility  ?Outcome Measure Help needed turning from your back to your side while in a flat bed without using bedrails?: None ?Help needed moving from lying on your back to sitting on the side of a flat bed without using bedrails?: A Little ?Help needed moving to and from a bed to a chair (including a wheelchair)?: A Little ?Help needed standing up from a chair using your arms (e.g., wheelchair or bedside chair)?: A Little ?Help needed to walk in hospital room?: A Little ?Help needed climbing 3-5 steps with a railing? : A Little ?6 Click Score: 19 ? ?  ?End of Session Equipment Utilized During Treatment: Gait belt ?Activity Tolerance: Patient tolerated treatment well;No increased pain ?Patient left: in chair;with call bell/phone within reach;with chair alarm set ?Nurse Communication: Mobility status ?PT Visit Diagnosis: Pain;Difficulty in walking, not elsewhere classified (R26.2) ?Pain - Right/Left: Left ?Pain - part of body: Hip ?  ? ?Time: 1830-1905 ?PT Time Calculation (min) (ACUTE ONLY): 35 min ? ? ?  Charges:   PT Evaluation ?$PT Eval Low Complexity: 1 Low ?PT Treatments ?$Gait Training: 8-22 mins ?  ?   ? ? ?Coolidge Breeze, PT, DPT ?[facility] Department ?Office: [phone] ?Pager: [phone] ? ?Coolidge Breeze ?04/30/2021, 7:16 PM ? ?

## 2021-04-30 NOTE — Anesthesia Procedure Notes (Signed)
Date/Time: 04/30/2021 12:18 PM ?Performed by: Sharlette Dense, CRNA ?Oxygen Delivery Method: Simple face mask ? ? ? ? ?

## 2021-04-30 NOTE — Anesthesia Preprocedure Evaluation (Addendum)
Anesthesia Evaluation  ?Patient identified by MRN, date of birth, ID band ?Patient awake ? ? ? ?Reviewed: ?Allergy & Precautions, NPO status , Patient's Chart, lab work & pertinent test results ? ?History of Anesthesia Complications ?Negative for: history of anesthetic complications ? ?Airway ?Mallampati: II ? ?TM Distance: >3 FB ?Neck ROM: Full ? ? ? Dental ?no notable dental hx. ?(+) Dental Advisory Given ?  ?Pulmonary ?sleep apnea and Continuous Positive Airway Pressure Ventilation , former smoker,  ?  ?Pulmonary exam normal ? ? ? ? ? ? ? Cardiovascular ?negative cardio ROS ?Normal cardiovascular exam ? ? ?  ?Neuro/Psych ?negative neurological ROS ?   ? GI/Hepatic ?Neg liver ROS, hiatal hernia, GERD  ,  ?Endo/Other  ?negative endocrine ROS ? Renal/GU ?negative Renal ROS  ? ?  ?Musculoskeletal ? ?(+) Arthritis ,  ? Abdominal ?  ?Peds ? Hematology ?negative hematology ROS ?(+)   ?Anesthesia Other Findings ? ? Reproductive/Obstetrics ? ?  ? ? ? ? ? ? ? ? ? ? ? ? ? ?  ?  ? ? ? ? ? ? ? ?Anesthesia Physical ?Anesthesia Plan ? ?ASA: 2 ? ?Anesthesia Plan: Spinal and MAC  ? ?Post-op Pain Management: Celebrex PO (pre-op)* and Tylenol PO (pre-op)*  ? ?Induction:  ? ?PONV Risk Score and Plan: 2 and Ondansetron and Propofol infusion ? ?Airway Management Planned: Natural Airway ? ?Additional Equipment:  ? ?Intra-op Plan:  ? ?Post-operative Plan:  ? ?Informed Consent: I have reviewed the patients History and Physical, chart, labs and discussed the procedure including the risks, benefits and alternatives for the proposed anesthesia with the patient or authorized representative who has indicated his/her understanding and acceptance.  ? ? ? ?Dental advisory given ? ?Plan Discussed with: Anesthesiologist and CRNA ? ?Anesthesia Plan Comments:   ? ? ? ? ? ?Anesthesia Quick Evaluation ? ?

## 2021-04-30 NOTE — Interval H&P Note (Signed)
History and Physical Interval Note: James [name] understands that he is here today for a left hip replacement to treat his left hip osteoarthritis.  There has been no acute or interval change in his medical status.  Please see recent H&P.  The risks and benefits of surgery been explained in detail and informed consent is obtained.  The left hip has been marked. ? ?04/30/2021 ?10:52 AM ? ?[name]  has presented today for surgery, with the diagnosis of OSTEOARTHRITIS / [name].  The various methods of treatment have been discussed with the patient and family. After consideration of risks, benefits and other options for treatment, the patient has consented to  Procedure(s): ?LEFT TOTAL HIP ARTHROPLASTY ANTERIOR APPROACH (Left) as a surgical intervention.  The patient's history has been reviewed, patient examined, no change in status, stable for surgery.  I have reviewed the patient's chart and labs.  Questions were answered to the patient's satisfaction.   ? ? ?Mcarthur Rossetti ? ? ?

## 2021-04-30 NOTE — Anesthesia Postprocedure Evaluation (Signed)
Anesthesia Post Note ? ?Patient: James [name] ? ?Procedure(s) Performed: LEFT TOTAL HIP ARTHROPLASTY ANTERIOR APPROACH (Left: Hip) ? ?  ? ?Patient location during evaluation: PACU ?Anesthesia Type: MAC and Spinal ?Level of consciousness: awake and alert ?Pain management: pain level controlled ?Vital Signs Assessment: post-procedure vital signs reviewed and stable ?Respiratory status: spontaneous breathing and respiratory function stable ?Cardiovascular status: blood pressure returned to baseline and stable ?Postop Assessment: spinal receding ?Anesthetic complications: no ? ? ?No notable events documented. ? ?Last Vitals:  ?Vitals:  ? 04/30/21 1430 04/30/21 1445  ?BP: 98/66 100/66  ?Pulse: 62 (!) 56  ?Resp: 13 13  ?Temp:  (!) 36.4 ?C  ?SpO2: 96% 99%  ?  ?Last Pain:  ?Vitals:  ? 04/30/21 1445  ?TempSrc:   ?PainSc: 0-No pain  ? ? ?LLE Motor Response: Non-purposeful movement (04/30/21 1445) ?  ?RLE Motor Response: Purposeful movement (04/30/21 1445) ?  ?L Sensory Level: L3-Anterior knee, lower leg (04/30/21 1445) ?R Sensory Level: L5-Outer lower leg, top of foot, great toe (04/30/21 1445) ? ?[name] ? ? ? ? ?

## 2021-04-30 NOTE — Plan of Care (Signed)

## 2021-05-01 DIAGNOSIS — M1612 Unilateral primary osteoarthritis, left hip: Secondary | ICD-10-CM | POA: Diagnosis not present

## 2021-05-01 LAB — CBC
HCT: 34.9 % — ABNORMAL LOW (ref 39.0–52.0)
Hemoglobin: 11.9 g/dL — ABNORMAL LOW (ref 13.0–17.0)
MCH: 33.1 pg (ref 26.0–34.0)
MCHC: 34.1 g/dL (ref 30.0–36.0)
MCV: 96.9 fL (ref 80.0–100.0)
Platelets: 278 10*3/uL (ref 150–400)
RBC: 3.6 MIL/uL — ABNORMAL LOW (ref 4.22–5.81)
RDW: 12.8 % (ref 11.5–15.5)
WBC: 11.7 10*3/uL — ABNORMAL HIGH (ref 4.0–10.5)
nRBC: 0 % (ref 0.0–0.2)

## 2021-05-01 LAB — BASIC METABOLIC PANEL
Anion gap: 8 (ref 5–15)
BUN: 13 mg/dL (ref 8–23)
CO2: 26 mmol/L (ref 22–32)
Calcium: 9.2 mg/dL (ref 8.9–10.3)
Chloride: 100 mmol/L (ref 98–111)
Creatinine, Ser: 0.84 mg/dL (ref 0.61–1.24)
GFR, Estimated: >60 mL/min (ref >60)
Glucose, Bld: 138 mg/dL — ABNORMAL HIGH (ref 70–99)
Potassium: 4.4 mmol/L (ref 3.5–5.1)
Sodium: 134 mmol/L — ABNORMAL LOW (ref 135–145)

## 2021-05-01 MED ORDER — TIZANIDINE HCL 4 MG PO TABS: 4.0000 mg | ORAL_TABLET | Freq: Three times a day (TID) | ORAL | 0 refills | Status: DC | PRN

## 2021-05-01 MED ORDER — ASPIRIN 81 MG PO CHEW: 81.0000 mg | CHEWABLE_TABLET | Freq: Two times a day (BID) | ORAL | 1 refills | Status: DC

## 2021-05-01 MED ORDER — OXYCODONE HCL 5 MG PO TABS: 5.0000 mg | ORAL_TABLET | ORAL | 0 refills | Status: DC | PRN

## 2021-05-01 NOTE — Op Note (Signed)
NAME: James [name], James [name] ?MEDICAL RECORD NO: [id number] ?ACCOUNT NO: [id number] ?DATE OF BIRTH: October 05, 1955 ?FACILITY: [facility] ?LOCATION: [facility] ?PHYSICIAN: Lind Guest. Ninfa Linden, MD ? ?Operative Report  ? ?DATE OF PROCEDURE: 04/30/2021 ? ?PREOPERATIVE DIAGNOSIS:  Primary osteoarthritis and degenerative joint disease, left hip. ? ?POSTOPERATIVE DIAGNOSIS:  Primary osteoarthritis and degenerative joint disease, left hip. ? ?PROCEDURE:  Left total hip arthroplasty through direct anterior approach. ? ?IMPLANTS:  DePuy sector Gription acetabular component size 54, size 36+4 neutral polyethylene liner, size 11 Corail femoral component with standard offset, size 36+1.5 ceramic hip ball. ? ?SURGEON:  Lind Guest. Ninfa Linden, MD ? ?ASSISTANT:  Erskine Emery, PA-C ? ?ANESTHESIA:  Spinal. ? ?BLOOD LOSS:  200 mL ? ?ANTIBIOTICS:  2 g IV Ancef. ? ?COMPLICATIONS:  None. ? ?INDICATIONS:  The patient is a 66 year old gentleman well known to me.  I actually replaced his right hip in 2016.  His left hip has now developed significant pain.  His plain films showed some irregularities of the hip, but an MRI did show moderate to  ?severe osteoarthritis.  His pain is in the groin and is daily and it is getting worse.  It is detrimentally affecting his mobility, his activities of daily living and his quality of life to the point he does wish to proceed with a hip replacement.   ?Having had this before on the right side, he is fully aware of the risk of acute blood loss anemia, nerve or vessel injury, fracture, infection, dislocation, DVT, implant failure, leg length differences, and skin and soft tissue issues.  He understands  ?our goals are to decrease pain, improve mobility and overall improve quality of life. ? ?DESCRIPTION OF PROCEDURE:  After informed consent was obtained, appropriate left hip was marked, he was brought to the operating room and sat up on the stretcher where spinal anesthesia was obtained.  He was laid in supine position  on the stretcher.   ?Foley catheter was placed and traction boots were placed on both his feet.  Next, he was placed supine on the Hana fracture table, the perineal post in place and both legs in line skeletal traction device and no traction applied. His left operative hip  ?was prepped and draped with DuraPrep and sterile drapes.  A timeout was called and he was identified as correct patient and correct left hip.  I then made an incision just inferior and posterior to the anterior superior iliac spine and carried this  ?obliquely down the leg.  We dissected down to tensor fascia lata muscle.  Tensor fascia was then divided longitudinally to proceed with our direct anterior approach to the hip.  We identified and cauterized circumflex vessels.  I then identified the hip  ?capsule, opened up the hip capsule in L-type format, finding a moderate joint effusion.  I placed curved retractors around the medial and lateral femoral neck and we made our femoral neck cut with oscillating saw just proximal to the lesser trochanter  ?and completed this with an osteotome.  I placed a corkscrew guide in the femoral head and removed the femoral head in its entirety and we did find a wide area that had significant cartilage wear.  There was also tearing of the acetabular labrum.  I then  ?placed a bent Hohmann over the medial acetabular rim and removed remnants of acetabular labrum and other debris and then began reaming under direct visualization from a size 43 reamer in a stepwise increments going up to a size 53 reamer with all  reamers ? placed under direct visualization. The last reamer was also placed under direct fluoroscopy, so we could obtain my depth of reaming, my inclination and anteversion.  I then placed real DePuy sector Gription acetabular component size 54 along with 36+4  ?polyethylene liner based on medialization and his offset.  Attention was then turned to the femur.  With the leg externally rotated to 120  degrees, extended and adducted, we were able to place a Mueller retractor medially and Hohman retractor behind the  ?greater trochanter.  We released lateral joint capsule and used a box cutting osteotome to enter the femoral canal and a rongeur to lateralize, then began broaching using the Corail broaching system from a size 8 going up to a size 11.  With a size 11 in ? place, we trialed a standard offset femoral neck and a 36+1.5 hip ball.  We brought the leg back over and up and with traction and internal rotation, reduced in the pelvis.  We were pleased with leg length, offset, range of motion and stability assessed ? mechanically and radiographically.  We then dislocated the hip and removed the trial components.  We placed the real Corail femoral component size 11 with standard offset and the real 36+1.5 ceramic hip ball and again reduced in the pelvis, assessed  ?mechanically and radiographically and we were pleased.  We then irrigated the soft tissue with normal saline solution.  The joint capsule was closed with interrupted #1 Ethibond suture followed by #1 Vicryl to close the tensor fascia, 0 Vicryl was used  ?to close deep tissue and 2-0 Vicryl was used to close subcutaneous tissue.  The skin was closed with staples.  An Aquacel dressing was applied.  He was taken to recovery room in stable condition with all final counts being correct.  No complications  ?noted.  Of note, Erskine Emery, PA-C, assisted during the entire case from beginning to end and his assistance was crucial for facilitating all aspects of this case including soft tissue management and retracting as well as guiding implants and layered  ?closure of the wound.  His assistance was medically necessary. ? ? ?VAI ?D: 04/30/2021 1:26:24 pm T: 05/01/2021 1:04:00 am  ?JOB: [id number]/ [id number]  ?

## 2021-05-01 NOTE — Discharge Instructions (Signed)

## 2021-05-01 NOTE — Progress Notes (Signed)
Subjective: ?1 Day Post-Op Procedure(s) (LRB): ?LEFT TOTAL HIP ARTHROPLASTY ANTERIOR APPROACH (Left) ?Patient reports pain as moderate.   ? ?Objective: ?Vital signs in last 24 hours: ?Temp:  [97.5 ?F (36.4 ?C)-98.4 ?F (36.9 ?C)] 97.9 ?F (36.6 ?C) (04/15 0425) ?Pulse Rate:  [55-89] 70 (04/15 0932) ?Resp:  [11-22] 18 (04/15 0932) ?BP: (96-124)/(62-86) 107/73 (04/15 0932) ?SpO2:  [94 %-100 %] 98 % (04/15 0932) ?Weight:  [86.2 kg] 86.2 kg (04/14 1728) ? ?Intake/Output from previous day: ?04/14 0701 - 04/15 0700 ?In: 2838.1 [P.O.:480; I.V.:2108.1; IV Piggyback:250] ?Out: 2900 [Urine:2700; Blood:200] ?Intake/Output this shift: ?No intake/output data recorded. ? ?Recent Labs  ?  05/01/21 ?0327  ?HGB 11.9*  ? ?Recent Labs  ?  05/01/21 ?0327  ?WBC 11.7*  ?RBC 3.60*  ?HCT 34.9*  ?PLT 278  ? ?Recent Labs  ?  05/01/21 ?0327  ?NA 134*  ?K 4.4  ?CL 100  ?CO2 26  ?BUN 13  ?CREATININE 0.84  ?GLUCOSE 138*  ?CALCIUM 9.2  ? ?No results for input(s): LABPT, INR in the last 72 hours. ? ?Sensation intact distally ?Intact pulses distally ?Dorsiflexion/Plantar flexion intact ?Incision: dressing C/D/I ? ? ?Assessment/Plan: ?1 Day Post-Op Procedure(s) (LRB): ?LEFT TOTAL HIP ARTHROPLASTY ANTERIOR APPROACH (Left) ?Up with therapy ?Discharge home with home health this afternoon vs tomorrow depending on progress with mobility. ? ? ? ? ? ?Mcarthur Rossetti ?05/01/2021, 9:47 AM ? ?

## 2021-05-01 NOTE — TOC Transition Note (Signed)
Transition of Care (TOC) - CM/SW Discharge Note ? ? ?Patient Details  ?Name: James [name] ?MRN: [id number] ?Date of Birth: [birth year] ? ?Transition of Care (TOC) CM/SW Contact:  ?Tawanna Cooler, RN ?Phone Number: ?05/01/2021, 10:08 AM ? ? ?Clinical Narrative:    ? ?Spoke with patient at bedside.  States he needs a RW, but he doesn't need a 3-in-1.  [facility] is DME choice.  CM notified [place] and she will deliver RW to room.  ?Has already been set up for [facility] with [facility] through the ortho office.   ?No further TOC needs.  ? ?Final next level of care: [place] ?Barriers to Discharge: No Barriers Identified ? ? ?Patient Goals and CMS Choice ?Patient states their goals for this hospitalization and ongoing recovery are:: return home ?  ?Choice offered to / list presented to : Patient ? ? ?Discharge Plan and Services ?   ?DME Arranged: Walker rolling ?DME Agency: [facility] ?Date DME Agency Contacted: 05/01/21 ?Time DME Agency Contacted: 1001 ?Representative spoke with at DME Agency: Delana Meyer ?HH Arranged: PT ?[place] Agency: [place] ?  ?  ?Representative spoke with at [place]: [place] set up by ortho office ? ?

## 2021-05-01 NOTE — Discharge Summary (Signed)
Patient ID: ?James [name] ?MRN: [id number] ?DOB/AGE: [birth year] 66 y.o. ? ?Admit date: 04/30/2021 ?Discharge date: 05/01/2021 ? ?Admission Diagnoses:  ?Principal Problem: ?  Unilateral primary osteoarthritis, left hip ?Active Problems: ?  Status post left hip replacement ? ? ?Discharge Diagnoses:  ?Same ? ?Past Medical History:  ?Diagnosis Date  ? Anemia   ? Aortic atherosclerosis ([place])   ? Cancer [facility])   ? skin  ? Colitis   ? Degenerative disc disease   ? Diverticulosis 2013  ? Colonoscopy  ? GERD (gastroesophageal reflux disease)   ? Hiatal hernia   ? Hx of colonic polyps 2013  ? Colonoscopy- tubular adenoma, and hyperplastic   ? Hyperlipidemia   ? Internal hemorrhoids 2013  ? Colonoscopy  ? Lower back pain   ? Right inguinal hernia   ? Sleep apnea 2015  ? on CPAP  ? Tubular adenoma   ? ? ?Surgeries: Procedure(s): ?LEFT TOTAL HIP ARTHROPLASTY ANTERIOR APPROACH on 04/30/2021 ?  ?Consultants:  ? ?Discharged Condition: Improved ? ?Hospital Course: James [name] is an 66 y.o. male who was admitted 04/30/2021 for operative treatment ofUnilateral primary osteoarthritis, left hip. Patient has severe unremitting pain that affects sleep, daily activities, and work/hobbies. After pre-op clearance the patient was taken to the operating room on 04/30/2021 and underwent  Procedure(s): ?LEFT TOTAL HIP ARTHROPLASTY ANTERIOR APPROACH.   ? ?Patient was given perioperative antibiotics:  ?Anti-infectives (From admission, onward)  ? ? Start     Dose/Rate Route Frequency Ordered Stop  ? 04/30/21 1800  ceFAZolin (ANCEF) IVPB 1 g/50 mL premix       ? 1 g ?100 mL/hr over 30 Minutes Intravenous Every 6 hours 04/30/21 1701 05/01/21 0033  ? 04/30/21 1000  ceFAZolin (ANCEF) IVPB 2g/100 mL premix       ? 2 g ?200 mL/hr over 30 Minutes Intravenous On call to O.R. 04/30/21 0945 04/30/21 1235  ? ?  ?  ? ?Patient was given sequential compression devices, early ambulation, and chemoprophylaxis to prevent DVT. ? ?Patient benefited maximally from  hospital stay and there were no complications.   ? ?Recent vital signs: Patient Vitals for the past 24 hrs: ? BP Temp Temp src Pulse Resp SpO2 Height Weight  ?05/01/21 0932 107/73 -- -- 70 18 98 % -- --  ?05/01/21 0425 117/86 97.9 ?F (36.6 ?C) Oral 68 17 98 % -- --  ?05/01/21 0121 102/69 98.1 ?F (36.7 ?C) Oral 66 18 94 % -- --  ?04/30/21 2032 114/75 98.4 ?F (36.9 ?C) -- 89 18 97 % -- --  ?04/30/21 2032 -- -- -- 74 17 98 % -- --  ?04/30/21 1728 120/82 97.9 ?F (36.6 ?C) Axillary 73 17 -- 5' 11"  (1.803 m) 86.2 kg  ?04/30/21 1703 120/82 97.9 ?F (36.6 ?C) Axillary 73 17 98 % -- --  ?04/30/21 1600 104/73 -- -- 63 -- 99 % -- --  ?04/30/21 1500 106/66 -- -- (!) 55 (!) 22 98 % -- --  ?04/30/21 1445 100/66 (!) 97.5 ?F (36.4 ?C) -- (!) 56 13 99 % -- --  ?04/30/21 1430 98/66 -- -- 62 13 96 % -- --  ?04/30/21 1415 96/62 -- -- 62 16 95 % -- --  ?  ? ?Recent laboratory studies:  ?Recent Labs  ?  05/01/21 ?0327  ?WBC 11.7*  ?HGB 11.9*  ?HCT 34.9*  ?PLT 278  ?NA 134*  ?K 4.4  ?CL 100  ?CO2 26  ?BUN 13  ?CREATININE 0.84  ?GLUCOSE 138*  ?  CALCIUM 9.2  ? ? ? ?Discharge Medications:   ?Allergies as of 05/01/2021   ? ?   Reactions  ? Prednisone   ? R/T pooling of blood behind the retina.   ? ?  ? ?  ?Medication List  ?  ? ?STOP taking these medications   ? ?acetaminophen-codeine 300-30 MG tablet ?Commonly known as: TYLENOL #3 ?  ? ?  ? ?TAKE these medications   ? ?ALPRAZolam 0.25 MG tablet ?Commonly known as: Duanne Moron ?TAKE 1 TABLET(0.25 MG) BY MOUTH TWICE DAILY AS NEEDED FOR ANXIETY ?  ?aspirin 81 MG chewable tablet ?Chew 1 tablet (81 mg total) by mouth 2 (two) times daily. ?  ?cholecalciferol 25 MCG (1000 UNIT) tablet ?Commonly known as: VITAMIN D3 ?Take 1,000 Units by mouth daily. ?  ?ibuprofen 800 MG tablet ?Commonly known as: ADVIL ?TAKE 1 TABLET(800 MG) BY MOUTH EVERY 8 HOURS AS NEEDED ?  ?mesalamine 1.2 g EC tablet ?Commonly known as: LIALDA ?TAKE 4 TABLETS(4.8 GRAMS) BY MOUTH DAILY WITH BREAKFAST ?  ?mesalamine 1000 MG  suppository ?Commonly known as: CANASA ?UNWRAP AND INSERT 1 SUPPOSITORY(1000 MG) RECTALLY AT BEDTIME ?  ?multivitamin tablet ?Take 1 tablet by mouth daily. ?  ?oxyCODONE 5 MG immediate release tablet ?Commonly known as: Oxy IR/ROXICODONE ?Take 1-2 tablets (5-10 mg total) by mouth every 4 (four) hours as needed for moderate pain (pain score 4-6). ?  ?oxymetazoline 0.05 % nasal spray ?Commonly known as: AFRIN ?Place 4 sprays into both nostrils at bedtime. ?  ?pantoprazole 40 MG tablet ?Commonly known as: PROTONIX ?Take 1 tablet (40 mg total) by mouth daily. ?  ?polyethylene glycol powder 17 GM/SCOOP powder ?Commonly known as: GLYCOLAX/MIRALAX ?Take 255 g by mouth 2 (two) times daily. ?What changed:  ?how much to take ?when to take this ?  ?rosuvastatin 5 MG tablet ?Commonly known as: CRESTOR ?TAKE 1 TABLET BY MOUTH EVERY DAY ?  ?tiZANidine 4 MG tablet ?Commonly known as: Zanaflex ?Take 1 tablet (4 mg total) by mouth every 8 (eight) hours as needed for muscle spasms. ?What changed: Another medication with the same name was added. Make sure you understand how and when to take each. ?  ?tiZANidine 4 MG tablet ?Commonly known as: ZANAFLEX ?Take 1 tablet (4 mg total) by mouth every 8 (eight) hours as needed for muscle spasms. ?What changed: You were already taking a medication with the same name, and this prescription was added. Make sure you understand how and when to take each. ?  ?VITAMIN C PO ?Take 1 tablet by mouth daily. ?  ? ?  ? ?  ?  ? ? ?  ?Durable Medical Equipment  ?(From admission, onward)  ?  ? ? ?  ? ?  Start     Ordered  ? 04/30/21 1702  DME 3 n 1  Once       ? 04/30/21 1701  ? 04/30/21 1702  DME Walker rolling  Once       ?Question Answer Comment  ?Walker: With 5 Inch Wheels   ?Patient needs a walker to treat with the following condition Status post left hip replacement   ?  ? 04/30/21 1701  ? ?  ?  ? ?  ? ? ?Diagnostic Studies: DG Pelvis Portable ? ?Result Date: 04/30/2021 ?CLINICAL DATA:  Status post left  hip arthroplasty EXAM: PORTABLE PELVIS 1-2 VIEWS COMPARISON:  CT done on 02/10/2021 FINDINGS: There is interval left hip arthroplasty. No fracture is seen. There are pockets of air in  the soft tissues. There is previous right hip arthroplasty. Degenerative changes are noted in the visualized lower lumbar spine. IMPRESSION: Status post left hip arthroplasty. Electronically Signed   By: Elmer Picker M.D.   On: 04/30/2021 14:01  ? ?DG C-Arm 1-60 Min-No Report ? ?Result Date: 04/30/2021 ?Fluoroscopy was utilized by the requesting physician.  No radiographic interpretation.  ? ?DG HIP UNILAT WITH PELVIS 1V LEFT ? ?Result Date: 04/30/2021 ?CLINICAL DATA:  Operative fluoroscopy for left hip arthroscopy. EXAM: DG HIP (WITH OR WITHOUT PELVIS) 1V*L* COMPARISON:  Pelvis and left hip radiographs 05/18/2020 FINDINGS: Images were performed intraoperatively without the presence of a radiologist. New total left hip arthroplasty hardware is seen. Redemonstration of total right hip arthroplasty hardware. Total fluoroscopy images: 3 Total fluoroscopy time: 24 seconds Total dose: Radiation Exposure Index (as provided by the fluoroscopic device): 2.49 mGy air Kerma Please see intraoperative findings for further detail. IMPRESSION: Fluoroscopic guidance for interval total left hip arthroplasty. Electronically Signed   By: Yvonne Kendall M.D.   On: 04/30/2021 13:53   ? ?Disposition: Discharge disposition: 01-Home or Self Care ? ? ? ? ? ? ? ? ? Follow-up Information   ? ? Mcarthur Rossetti, MD Follow up in 2 week(s).   ?Specialty: Orthopedic Surgery ?Contact information: ?[address] ?[place] [address] ?[phone] ? ? ?  ?  ? ?  ?  ? ?  ? ? ? ?Signed: ?Mcarthur Rossetti ?05/01/2021, 2:08 PM ? ? ? ?

## 2021-05-01 NOTE — Progress Notes (Signed)
Physical Therapy Treatment ?Patient Details ?Name: James [name] ?MRN: [id number] ?DOB: 11-21-1955 ?Today's Date: 05/01/2021 ? ? ?History of Present Illness Pt is a 66yo male presenting s/p L-THA, anterior approach on 04/30/21. PMH: OA, GERD, hyperlipidemia, R-THA 2016 ? ?  ?PT Comments  ? ? Pt in good spirits and progressing very well with mobility. Pt up to ambulate increased distance in hall with RW and then crutches; negotiated stairs; reviewed car transfers; and performed HEP with written instruction provided and reviewed.  Pt states feels comfortable with dc home this date.  ?Recommendations for follow up therapy are one component of a multi-disciplinary discharge planning process, led by the attending physician.  Recommendations may be updated based on patient status, additional functional criteria and insurance authorization. ? ?Follow Up Recommendations ? Follow physician's recommendations for discharge plan and follow up therapies ?  ?  ?Assistance Recommended at Discharge Set up Supervision/Assistance  ?Patient can return home with the following A little help with walking and/or transfers;A little help with bathing/dressing/bathroom;Assistance with cooking/housework;Assist for transportation;Help with stairs or ramp for entrance ?  ?Equipment Recommendations ? None recommended by PT  ?  ?Recommendations for Other Services   ? ? ?  ?Precautions / Restrictions Precautions ?Precautions: Fall ?Restrictions ?Weight Bearing Restrictions: No ?LLE Weight Bearing: Weight bearing as tolerated  ?  ? ?Mobility ? Bed Mobility ?Overal bed mobility: Needs Assistance ?Bed Mobility: Supine to Sit, Sit to Supine ?  ?  ?Supine to sit: Supervision ?Sit to supine: Supervision ?  ?General bed mobility comments: min cues for technique ?  ? ?Transfers ?Overall transfer level: Needs assistance ?Equipment used: Rolling walker (2 wheels) ?Transfers: Sit to/from Stand ?Sit to Stand: Min guard, Supervision ?  ?  ?  ?  ?  ?General  transfer comment: cues for LE management and use of UEs to self assist ?  ? ?Ambulation/Gait ?Ambulation/Gait assistance: Supervision ?Gait Distance (Feet): 500 Feet ?Assistive device: Rolling walker (2 wheels), Crutches ?Gait Pattern/deviations: Step-through pattern, Decreased stance time - left ?Gait velocity: decreased ?  ?  ?General Gait Details: 250' with RW and additional 250' with crutches.  Pt with good safety awareness and stability with either device ? ? ?Stairs ?Stairs: Yes ?Stairs assistance: Min guard ?Stair Management: No rails, One rail Right, Step to pattern, Forwards, With crutches ?Number of Stairs: 10 ?General stair comments: 7 stairs with bil crutches and 3 steps with crutch and rail; min cues for sequence ? ? ?Wheelchair Mobility ?  ? ?Modified Rankin (Stroke Patients Only) ?  ? ? ?  ?Balance Overall balance assessment: Needs assistance ?Sitting-balance support: Feet supported, No upper extremity supported ?Sitting balance-Leahy Scale: Good ?  ?  ?Standing balance support: No upper extremity supported ?Standing balance-Leahy Scale: Fair ?  ?  ?  ?  ?  ?  ?  ?  ?  ?  ?  ?  ?  ? ?  ?Cognition Arousal/Alertness: Awake/alert ?Behavior During Therapy: [facility] for tasks assessed/performed ?Overall Cognitive Status: Within Functional Limits for tasks assessed ?  ?  ?  ?  ?  ?  ?  ?  ?  ?  ?  ?  ?  ?  ?  ?  ?  ?  ?  ? ?  ?Exercises Total Joint Exercises ?Ankle Circles/Pumps: AROM, 20 reps, Both ?Quad Sets: AROM, Left, 10 reps, Supine ?Heel Slides: AAROM, Left, 20 reps, Supine ?Hip ABduction/ADduction: AAROM, Left, 15 reps, Supine ? ?  ?General Comments   ?  ?  ? ?  Pertinent Vitals/Pain Pain Assessment ?Pain Assessment: 0-10 ?Pain Score: 4  ?Pain Location: left hip and lower back ?Pain Descriptors / Indicators: Aching, Sore ?Pain Intervention(s): Limited activity within patient's tolerance, Monitored during session, Premedicated before session  ? ? ?Home Living   ?  ?  ?  ?  ?  ?  ?  ?  ?  ?   ?  ?Prior  Function    ?  ?  ?   ? ?PT Goals (current goals can now be found in the care plan section) Acute Rehab PT Goals ?Patient Stated Goal: To walk further without pain ?PT Goal Formulation: With patient ?Time For Goal Achievement: 05/07/21 ?Potential to Achieve Goals: Good ?Progress towards PT goals: Progressing toward goals ? ?  ?Frequency ? ? ? 7X/week ? ? ? ?  ?PT Plan Current plan remains appropriate  ? ? ?Co-evaluation   ?  ?  ?  ?  ? ?  ?AM-PAC PT "6 Clicks" Mobility   ?Outcome Measure ? Help needed turning from your back to your side while in a flat bed without using bedrails?: None ?Help needed moving from lying on your back to sitting on the side of a flat bed without using bedrails?: A Little ?Help needed moving to and from a bed to a chair (including a wheelchair)?: A Little ?Help needed standing up from a chair using your arms (e.g., wheelchair or bedside chair)?: A Little ?Help needed to walk in hospital room?: A Little ?Help needed climbing 3-5 steps with a railing? : A Little ?6 Click Score: 19 ? ?  ?End of Session Equipment Utilized During Treatment: Gait belt ?Activity Tolerance: Patient tolerated treatment well;No increased pain ?Patient left: in bed;with call bell/phone within reach;with bed alarm set ?Nurse Communication: Mobility status ?PT Visit Diagnosis: Pain;Difficulty in walking, not elsewhere classified (R26.2) ?Pain - Right/Left: Left ?Pain - part of body: Hip ?  ? ? ?Time: 0370-9643 ?PT Time Calculation (min) (ACUTE ONLY): 72 min ? ?Charges:  $Gait Training: 23-37 mins ?$Therapeutic Exercise: 8-22 mins ?$Therapeutic Activity: 8-22 mins          ?          ? ?Debe Coder PT ?Acute Rehabilitation Services ?Pager [phone] ?Office [phone] ? ? ? ?James [name] ?05/01/2021, 12:29 PM ? ?

## 2021-05-03 ENCOUNTER — Encounter (HOSPITAL_COMMUNITY): Payer: Self-pay | Admitting: Orthopaedic Surgery

## 2021-05-07 ENCOUNTER — Other Ambulatory Visit: Payer: Self-pay | Admitting: Orthopaedic Surgery

## 2021-05-07 ENCOUNTER — Telehealth: Payer: Self-pay | Admitting: Orthopaedic Surgery

## 2021-05-07 MED ORDER — OXYCODONE HCL 5 MG PO TABS: 5.0000 mg | ORAL_TABLET | ORAL | 0 refills | Status: DC | PRN

## 2021-05-07 NOTE — Telephone Encounter (Signed)
Please advise 

## 2021-05-07 NOTE — Telephone Encounter (Signed)
Patient called needing Rx refilled Oxycodone. The number to contact patient is [phone] ?

## 2021-05-10 ENCOUNTER — Telehealth: Payer: Self-pay | Admitting: Orthopaedic Surgery

## 2021-05-10 ENCOUNTER — Other Ambulatory Visit: Payer: Self-pay | Admitting: Orthopaedic Surgery

## 2021-05-10 MED ORDER — TIZANIDINE HCL 4 MG PO TABS: 4.0000 mg | ORAL_TABLET | Freq: Three times a day (TID) | ORAL | 1 refills | Status: DC | PRN

## 2021-05-10 NOTE — Telephone Encounter (Signed)
Please advise 

## 2021-05-10 NOTE — Telephone Encounter (Signed)
Pt would like refill on zanaflex ?

## 2021-05-13 ENCOUNTER — Ambulatory Visit (INDEPENDENT_AMBULATORY_CARE_PROVIDER_SITE_OTHER): Payer: Medicare Other | Admitting: Orthopaedic Surgery

## 2021-05-13 ENCOUNTER — Telehealth: Payer: Self-pay | Admitting: *Deleted

## 2021-05-13 ENCOUNTER — Encounter: Payer: Self-pay | Admitting: Orthopaedic Surgery

## 2021-05-13 DIAGNOSIS — Z96642 Presence of left artificial hip joint: Secondary | ICD-10-CM

## 2021-05-13 NOTE — Telephone Encounter (Signed)
Ortho bundle 14 day in office meeting completed. ?

## 2021-05-13 NOTE — Progress Notes (Signed)
The patient is 2 weeks tomorrow status post a left total hip arthroplasty.  He says he has good strength and range of motion is doing well overall.  He is ambulating using a walking stick.  He has been compliant with a baby aspirin twice a day.  We recently refilled his pain medication.  He did drive today. ? ?His left hip incision looks good.  The staples have been removed and Steri-Strips applied.  I did aspirate about 50 cc of seroma around the incision.  His leg lengths look good. ? ?He will go down to a baby aspirin once a day for a week and then can stop the baby aspirin.  He will continue to increase his activities as comfort allows.  I would like to see him back in 4 weeks for repeat exam but no x-rays are needed.  If there are issues before then he knows to let us know. ?

## 2021-05-14 ENCOUNTER — Encounter: Payer: Self-pay | Admitting: *Deleted

## 2021-05-19 ENCOUNTER — Ambulatory Visit (INDEPENDENT_AMBULATORY_CARE_PROVIDER_SITE_OTHER): Payer: Medicare Other | Admitting: Internal Medicine

## 2021-05-19 ENCOUNTER — Encounter: Payer: Self-pay | Admitting: Internal Medicine

## 2021-05-19 VITALS — BP 114/72 | HR 70 | Ht 71.0 in | Wt 190.0 lb

## 2021-05-19 DIAGNOSIS — K5900 Constipation, unspecified: Secondary | ICD-10-CM

## 2021-05-19 DIAGNOSIS — K219 Gastro-esophageal reflux disease without esophagitis: Secondary | ICD-10-CM

## 2021-05-19 DIAGNOSIS — K51511 Left sided colitis with rectal bleeding: Secondary | ICD-10-CM

## 2021-05-19 DIAGNOSIS — K2270 Barrett's esophagus without dysplasia: Secondary | ICD-10-CM | POA: Diagnosis not present

## 2021-05-19 MED ORDER — MESALAMINE 1000 MG RE SUPP: RECTAL | 2 refills | Status: DC

## 2021-05-19 MED ORDER — MESALAMINE 1.2 G PO TBEC: DELAYED_RELEASE_TABLET | ORAL | 2 refills | Status: DC

## 2021-05-19 NOTE — Progress Notes (Signed)
? ?Subjective:  ? ? Patient ID: James [name], male    DOB: 1955-09-10, 66 y.o.   MRN: [id number] ? ?HPI ?James [name] is a 66 year old male with a past medical history of left-sided ulcerative colitis diagnosed in 2018, GERD with history of Barrett's esophagus without dysplasia, history of sessile serrated and adenomatous colon polyps, IDA secondary to IBD, chorioretinopathy, previously repaired inguinal hernias who is here for follow-up.  He is here alone today. ? ?James [name] was here on 03/15/2021 for EGD and colonoscopy ?EGD revealed short segment Barrett's esophagus which was biopsied.  This confirms Barrett's without dysplasia.  2 cm hiatal hernia.  Normal stomach and examined duodenum. ?Colonoscopy normal terminal ileum, inflammation was patchy and moderate in the rectum, descending colon, transverse colon.  The rectosigmoid colon, mid sigmoid, distal sigmoid, proximal transverse colon, ascending colon and cecum were spared.  Graded as Mayo 2.  Polyp removed from the cecum.  Polyp removed from the rectum.  Diverticulosis in the ascending and left colon.  Small hemorrhoids. ?Pathology from colonoscopy = cecum sessile serrated polyp without dysplasia; ascending colon and cecum unremarkable without colitis, transverse unremarkable colonic mucosa, sigmoid and descending unremarkable colonic mucosa, rectum moderate active chronic colitis.  Rectal polyp hyperplastic ? ?He resume Canasa given the findings at colonoscopy in addition to Lialda 4.8 g/day. ? ?From a GI perspective he reports that he is feeling well.  He is using MiraLAX on a daily basis and typically will have soft mostly formed stools once per day.  No abdominal pain.  No significant blood in stool though he had a "hint of blood" 3 days ago but none before or since.  He feels that the James [name] has been helpful.  He continues Lialda 4.8 g/day. ? ?2 and half weeks ago he had his left total hip replaced.  He is slowly increasing his activity level. ? 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  This is physically difficult for him at times. ? ? ?Review of Systems ?As per HPI otherwise negative ? ?Current Medications, Allergies, Past Medical History, Past Surgical History, Family History and Social History were reviewed in [employer] record. ?   ?Objective:  ? Physical Exam ?BP 114/72   Pulse 70   Ht 5' 11"  (1.803 m)   Wt 190 lb (86.2 kg)   BMI 26.50 kg/m?  ?Gen: awake, alert, NAD ?HEENT: anicteric ?Ext: no c/c/e ?Neuro: nonfocal ? ? ?   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  ? ?Left sided colitis (dx 2018) --clinically he is doing well.  Endoscopically he had the majority of his active inflammation in the rectum.  We have added back Canasa which he feels has helped and he is tolerating.  We did briefly today discussed Entyvio if we are unable to reach endoscopic remission with 5-ASA alone.  Plan: ?--Continue Lialda 4.8 g daily ?--Continue Canasa 1 g nightly ?--Check fecal calprotectin on 08/17/2021; if significantly elevated consider addition of Activia ?--He can continue MiraLAX 17 g daily ?--Surveillance colonoscopy around 2026 ? ?2.  GERD with Barrett's esophagus without dysplasia --recent surveillance endoscopy reviewed ?--Continue pantoprazole 40 mg daily ?--Surveillance endoscopy in 2026 ? ?3.  Mild constipation --MiraLAX 17  g daily ? ?4.  IDA --previously related to #1; hemoglobin has normalized ? ?30 minutes total spent today including patient facing time, coordination of care, reviewing medical history/procedures/pertinent radiology studies, and documentation of the  encounter. ? ? ?

## 2021-05-19 NOTE — Patient Instructions (Addendum)
We have sent the following medications to your pharmacy for you to pick up at your convenience: ?James [name]  ? ?Your provider has requested that you go to the basement level for lab work before leaving today to pick up stool kit.Press "B" on the elevator. The lab is located at the first door on the left as you exit the elevator. ? ? ?Please submit stool study for fecal calprotectin 1 week in August 2023. You do not need appointment. Our lab is open Mon-Fri 8:00am-4:30pm.  ? ?Follow up in 6 months.Office to contact you when schedule is available.  ? ?Thank you for choosing me and [facility]. ? ? ? ?

## 2021-05-20 ENCOUNTER — Other Ambulatory Visit: Payer: Self-pay | Admitting: Orthopaedic Surgery

## 2021-05-20 ENCOUNTER — Telehealth: Payer: Self-pay | Admitting: Orthopaedic Surgery

## 2021-05-20 MED ORDER — OXYCODONE HCL 5 MG PO TABS: 5.0000 mg | ORAL_TABLET | ORAL | 0 refills | Status: DC | PRN

## 2021-05-20 MED ORDER — TIZANIDINE HCL 4 MG PO TABS: 4.0000 mg | ORAL_TABLET | Freq: Three times a day (TID) | ORAL | 1 refills | Status: DC | PRN

## 2021-05-20 NOTE — Telephone Encounter (Signed)
Patient called needing Rx refilled Oxycodone and Tizanadine. The number to contact patient is [phone] ?

## 2021-06-09 ENCOUNTER — Ambulatory Visit (INDEPENDENT_AMBULATORY_CARE_PROVIDER_SITE_OTHER): Payer: Medicare Other | Admitting: Orthopaedic Surgery

## 2021-06-09 ENCOUNTER — Other Ambulatory Visit: Payer: Self-pay

## 2021-06-09 DIAGNOSIS — Z96642 Presence of left artificial hip joint: Secondary | ICD-10-CM

## 2021-06-09 DIAGNOSIS — G8929 Other chronic pain: Secondary | ICD-10-CM

## 2021-06-09 MED ORDER — TIZANIDINE HCL 4 MG PO TABS: 4.0000 mg | ORAL_TABLET | Freq: Three times a day (TID) | ORAL | 1 refills | Status: DC | PRN

## 2021-06-09 MED ORDER — OXYCODONE HCL 5 MG PO TABS: 5.0000 mg | ORAL_TABLET | Freq: Four times a day (QID) | ORAL | 0 refills | Status: DC | PRN

## 2021-06-09 NOTE — Progress Notes (Signed)
The patient is 6 weeks status post a left total hip arthroplasty.  He says he is getting there in terms of range of motion and strength.  His primary care physician had ordered an MRI of his lumbar spine.  I cannot see those results but probably could only canopy system.  He has been having some significant low back pain with some radicular component as well.  He says the hip is doing well overall.  His left lower hip does move smoothly but does have some pain to be expected at 6 weeks after surgery.  He is walking without assistive device.  He is very active 66 years old.  He is a caregiver for his wife.  I would like to send him to outpatient physical therapy at [facility] outpatient rehab at [facility].  This is only to work on his lumbar spine with any modalities that can help decrease his pain and improve his function.  I will see him back in 6 weeks after course of therapy on his lumbar spine.  I will refill his pain medication and muscle relaxant as well.

## 2021-06-24 ENCOUNTER — Other Ambulatory Visit: Payer: Self-pay | Admitting: Internal Medicine

## 2021-06-29 ENCOUNTER — Ambulatory Visit: Payer: Medicare Other | Attending: Orthopaedic Surgery | Admitting: Physical Therapy

## 2021-07-09 ENCOUNTER — Telehealth: Payer: Self-pay | Admitting: *Deleted

## 2021-07-22 ENCOUNTER — Encounter: Payer: Medicare Other | Admitting: Orthopaedic Surgery

## 2021-08-17 ENCOUNTER — Other Ambulatory Visit: Payer: Medicare Other

## 2021-09-06 ENCOUNTER — Other Ambulatory Visit: Payer: Medicare Other

## 2021-09-06 DIAGNOSIS — K51511 Left sided colitis with rectal bleeding: Secondary | ICD-10-CM

## 2021-09-08 LAB — CALPROTECTIN, FECAL: Calprotectin, Fecal: 15 ug/g (ref 0–120)

## 2021-09-30 ENCOUNTER — Other Ambulatory Visit: Payer: Medicare Other

## 2021-09-30 DIAGNOSIS — F411 Generalized anxiety disorder: Secondary | ICD-10-CM

## 2021-09-30 DIAGNOSIS — F439 Reaction to severe stress, unspecified: Secondary | ICD-10-CM

## 2021-09-30 DIAGNOSIS — N401 Enlarged prostate with lower urinary tract symptoms: Secondary | ICD-10-CM

## 2021-09-30 DIAGNOSIS — E7800 Pure hypercholesterolemia, unspecified: Secondary | ICD-10-CM

## 2021-09-30 DIAGNOSIS — E785 Hyperlipidemia, unspecified: Secondary | ICD-10-CM

## 2021-09-30 DIAGNOSIS — Z87898 Personal history of other specified conditions: Secondary | ICD-10-CM

## 2021-10-01 LAB — COMPLETE METABOLIC PANEL WITH GFR
AG Ratio: 1.8 (calc) (ref 1.0–2.5)
ALT: 11 U/L (ref 9–46)
AST: 12 U/L (ref 10–35)
Albumin: 4.5 g/dL (ref 3.6–5.1)
Alkaline phosphatase (APISO): 46 U/L (ref 35–144)
BUN: 16 mg/dL (ref 7–25)
CO2: 21 mmol/L (ref 20–32)
Calcium: 9.8 mg/dL (ref 8.6–10.3)
Chloride: 112 mmol/L — ABNORMAL HIGH (ref 98–110)
Creat: 0.85 mg/dL (ref 0.70–1.35)
Globulin: 2.5 g/dL (calc) (ref 1.9–3.7)
Glucose, Bld: 86 mg/dL (ref 65–99)
Potassium: 4.3 mmol/L (ref 3.5–5.3)
Sodium: 142 mmol/L (ref 135–146)
Total Bilirubin: 0.6 mg/dL (ref 0.2–1.2)
Total Protein: 7 g/dL (ref 6.1–8.1)
eGFR: 95 mL/min/{1.73_m2} (ref >=60)

## 2021-10-01 LAB — CBC WITH DIFFERENTIAL/PLATELET
Absolute Monocytes: 515 cells/uL (ref 200–950)
Basophils Absolute: 20 cells/uL (ref 0–200)
Basophils Relative: 0.4 %
Eosinophils Absolute: 49 cells/uL (ref 15–500)
Eosinophils Relative: 1 %
HCT: 42 % (ref 38.5–50.0)
Hemoglobin: 14.1 g/dL (ref 13.2–17.1)
Lymphs Abs: 1171 cells/uL (ref 850–3900)
MCH: 31.8 pg (ref 27.0–33.0)
MCHC: 33.6 g/dL (ref 32.0–36.0)
MCV: 94.6 fL (ref 80.0–100.0)
MPV: 12.1 fL (ref 7.5–12.5)
Monocytes Relative: 10.5 %
Neutro Abs: 3146 cells/uL (ref 1500–7800)
Neutrophils Relative %: 64.2 %
Platelets: 255 10*3/uL (ref 140–400)
RBC: 4.44 10*6/uL (ref 4.20–5.80)
RDW: 12.3 % (ref 11.0–15.0)
Total Lymphocyte: 23.9 %
WBC: 4.9 10*3/uL (ref 3.8–10.8)

## 2021-10-01 LAB — LIPID PANEL
Cholesterol: 164 mg/dL (ref <200)
HDL: 80 mg/dL (ref >=40)
LDL Cholesterol (Calc): 70 mg/dL (calc)
Non-HDL Cholesterol (Calc): 84 mg/dL (calc) (ref <130)
Total CHOL/HDL Ratio: 2.1 (calc) (ref <5.0)
Triglycerides: 68 mg/dL (ref <150)

## 2021-10-01 LAB — PSA: PSA: 1.73 ng/mL (ref <=4.00)

## 2021-10-02 ENCOUNTER — Other Ambulatory Visit: Payer: Self-pay | Admitting: Internal Medicine

## 2021-10-04 ENCOUNTER — Ambulatory Visit (INDEPENDENT_AMBULATORY_CARE_PROVIDER_SITE_OTHER): Payer: Medicare Other | Admitting: Internal Medicine

## 2021-10-04 ENCOUNTER — Encounter: Payer: Self-pay | Admitting: Internal Medicine

## 2021-10-04 VITALS — BP 110/68 | HR 56 | Temp 97.9°F | Ht 70.5 in | Wt 187.0 lb

## 2021-10-04 DIAGNOSIS — F411 Generalized anxiety disorder: Secondary | ICD-10-CM

## 2021-10-04 DIAGNOSIS — Z0000 Encounter for general adult medical examination without abnormal findings: Secondary | ICD-10-CM

## 2021-10-04 DIAGNOSIS — Z96641 Presence of right artificial hip joint: Secondary | ICD-10-CM

## 2021-10-04 DIAGNOSIS — G4733 Obstructive sleep apnea (adult) (pediatric): Secondary | ICD-10-CM

## 2021-10-04 DIAGNOSIS — K51318 Ulcerative (chronic) rectosigmoiditis with other complication: Secondary | ICD-10-CM

## 2021-10-04 DIAGNOSIS — E7800 Pure hypercholesterolemia, unspecified: Secondary | ICD-10-CM

## 2021-10-04 DIAGNOSIS — K2100 Gastro-esophageal reflux disease with esophagitis, without bleeding: Secondary | ICD-10-CM

## 2021-10-04 DIAGNOSIS — F439 Reaction to severe stress, unspecified: Secondary | ICD-10-CM

## 2021-10-04 LAB — POCT URINALYSIS DIPSTICK
Bilirubin, UA: NEGATIVE
Blood, UA: NEGATIVE
Glucose, UA: NEGATIVE
Ketones, UA: NEGATIVE
Leukocytes, UA: NEGATIVE
Nitrite, UA: NEGATIVE
Protein, UA: NEGATIVE
Spec Grav, UA: 1.015 (ref 1.010–1.025)
Urobilinogen, UA: 0.2 E.U./dL
pH, UA: 5 (ref 5.0–8.0)

## 2021-10-04 NOTE — Progress Notes (Signed)
Annual Wellness Visit     Patient: James [name], Male    DOB: 03-30-1955, 66 y.o.   MRN: [id number] Visit Date: 10/04/2021  Chief Complaint  Patient presents with   Medicare Wellness   Annual Exam   Subjective    James [name] is a 66 y.o. male who presents today for his Annual Wellness Visit.  He also presents for health maintenance exam and evaluation of medical issues.  He has situational stress from wife who has metastatic lung cancer and is undergoing treatment.  He has a history of inflammatory bowel disease (left-sided ulcerative colitis) diagnosed in 2018 and is followed by gastroenterology.  Last colonoscopy was 2018.  He has a history of Barrett's esophagus without dysplasia diagnosed in 2018.  History of GE reflux.  History of sessile serrated adenomatous colon polyps.  Dr. Donnella Sham is gastroenterologist.  He has Canasa suppository and Lialda tablet for inflammatory bowel disease.  He takes Protonix for GE reflux.  History of snoring and sleep apnea.  He has CPAP device.  History of hyperlipidemia treated with low-dose Crestor 5 mg daily.  He has a history of BPH and has been prescribed Flomax but does not appear to be taking that at the present time.  History of osteoarthritis of right hip and is status post right hip arthroplasty by Dr. Ninfa Linden in February 2016.  He had left medial meniscectomy in 2019.  He cannot have steroid injections due to optical issues with his eyes.  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  History of fractured left forearm 1962, fractured right ankle 1971, left inguinal herniorrhaphy 1995, history of internal hemorrhoids.  History of low back pain.  History of meralgia paresthetica right leg.  He saw Dr. Joya Salm many years ago and was told he had degenerative disc disease of the spine.  No known drug allergies.  Social history: Married.   Completed 2 years of college.  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  They live near [place].  Family history: Father died at age 71 with history of coronary artery disease, esophageal and colon cancer.  Mother with history of stroke and colon cancer.  Mother is deceased.  2 sisters in good health.         Review of Systems situational stress with wife discussed.   Objective    Vitals: BP 110/68   Pulse (!) 56   Temp 97.9 F (36.6 C) (Tympanic)   Ht 5' 10.5" (1.791 m)   Wt 187 lb (84.8 kg)   SpO2 98%   BMI 26.45 kg/m   Physical Exam skin: Warm and dry.  No cervical adenopathy.  No thyromegaly.  No carotid bruits.  Chest clear.  Cardiac exam: Regular rate and rhythm.  Abdomen is soft nondistended without hepatosplenomegaly masses or tenderness.  No lower extremity pitting edema.  Brief neurological exam is intact without gross focal deficits.  Prostate exam deferred to [facility].  History of elevated PSA of 5.0 in 2021.  Subsequently PSA came down to 2.32 in March 2022 and with this visit is 1.73.  He can continues to be followed by Urology   Most recent functional status assessment:    10/04/2021   10:55 AM  In your present state of health, do you have any difficulty performing the following activities:  Hearing? 0  Vision? 0  Difficulty concentrating or making decisions? 0  Walking or climbing stairs? 0  Dressing or bathing? 0  Doing errands, shopping? 0  Preparing Food and eating ? N  Using the Toilet? N  In the past six months, have you accidently leaked urine? N  Do you have problems with loss of bowel control? Y  Managing your Medications? N  Managing your Finances? N  Housekeeping or managing your Housekeeping? N   Most recent fall risk assessment:    10/04/2021   10:54 AM  Fall Risk   Falls in the past year? 0  Number falls in past yr: 0  Injury with Fall? 0  Risk for fall due to : No Fall Risks  Follow up Falls  evaluation completed    Most recent depression screenings:    10/04/2021   10:55 AM 10/02/2020   11:08 AM  PHQ 2/9 Scores  PHQ - 2 Score 0 1  PHQ- 9 Score  3   Most recent cognitive screening:    10/04/2021   10:56 AM  6CIT Screen  What Year? 0 points  What month? 0 points  What time? 0 points  Count back from 20 0 points  Months in reverse 0 points  Repeat phrase 0 points  Total Score 0 points       Assessment & Plan  History of elevated PSA of 5.0 in 2021 subsequently became normal in March 2022 and is now normal at 1.73 as well  Situational stress with wife who has metastatic lung cancer  Patient has history of inflammatory bowel disease followed by Dr. Hilarie Fredrickson in stable  History of sleep apnea-has CPAP device  History of right hip arthroplasty  GE reflux treated with PPI  Pure hypercholesterolemia treated with low-dose Crestor daily  Plan: Vaccines discussed.  Patient is to return in 1 year or as needed.  He will have annual urology exam in the spring.  Tetanus immunization is up-to-date.  Recommend high-dose flu vaccine at pharmacy.  Recommend COVID booster.  Recommend pneumococcal 20 vaccine.       Annual wellness visit done today including the all of the following: Reviewed patient's Family Medical History Reviewed and updated list of patient's medical providers Assessment of cognitive impairment was done Assessed patient's functional ability Established a written schedule for health screening [place] Completed and Reviewed  Discussed health benefits of physical activity, and encouraged him to engage in regular exercise appropriate for his age and condition.         IElby Showers, MD, have reviewed all documentation for this visit. The documentation on 10/16/21 for the exam, diagnosis, procedures, and orders are all accurate and complete.   LaVon Barron Alvine, CMA

## 2021-10-04 NOTE — Progress Notes (Deleted)
   Subjective:    Patient ID: James [name], male    DOB: 02-Jul-1955, 66 y.o.   MRN: [id number]  HPI  66 year old Male seen for healt    Review of Systems     Objective:   Physical Exam        Assessment & Plan:

## 2021-10-16 NOTE — Patient Instructions (Addendum)
It was a pleasure to see you today.  Vaccines discussed.  Continue current medications.  See urologist in the Spring.  No change in medications.  Return in 1 year or as needed.

## 2021-10-18 ENCOUNTER — Telehealth: Payer: Self-pay | Admitting: Internal Medicine

## 2021-10-18 MED ORDER — ROSUVASTATIN CALCIUM 5 MG PO TABS: 5.0000 mg | ORAL_TABLET | Freq: Every day | ORAL | 3 refills | Status: DC

## 2021-10-18 NOTE — Telephone Encounter (Signed)
Received Fax RX request from  [facility] [place], [place] [address] [place] Phone:  [phone]  Fax:  [phone]      Medication - rosuvastatin (CRESTOR) 5 MG tablet  Last Refill - 07/30/2021  Last OV - 10/04/2021  Last CPE - 10/04/2021  Next Appointment - 04/05/2022

## 2021-10-18 NOTE — Telephone Encounter (Signed)
Rx refill sent to the pharmacy

## 2022-01-07 ENCOUNTER — Telehealth: Payer: Self-pay | Admitting: Pulmonary Disease

## 2022-01-07 NOTE — Telephone Encounter (Signed)
Looks like scan ordered is LDCT/ lung cancer screening. ATC patient and received busy signal. This is a [place] patient so will route to [facility] triage to follow up with patient.

## 2022-01-07 NOTE — Telephone Encounter (Signed)
PT got notice for a C-Scan we schedualed for him  but he has not seen Dr. In a long time and wonders why it was ordered or is it part of his health maintenance. Please call to advise him and set up appt to have results read once scan is done. TY

## 2022-01-11 NOTE — Telephone Encounter (Signed)
Spoke with the pt I advised that the CT for 02/10/22 is a followup since he is enrolled in the screening program  He verbalized understanding  OV with RA scheduled for the following wk- aware of new office location

## 2022-01-18 ENCOUNTER — Other Ambulatory Visit: Payer: Self-pay | Admitting: Internal Medicine

## 2022-01-19 ENCOUNTER — Other Ambulatory Visit: Payer: Self-pay

## 2022-01-20 MED ORDER — ALPRAZOLAM 0.25 MG PO TABS: ORAL_TABLET | ORAL | 5 refills | Status: DC

## 2022-02-10 ENCOUNTER — Ambulatory Visit
Admission: RE | Admit: 2022-02-10 | Discharge: 2022-02-10 | Disposition: A | Discharge status: Home / Self Care | Payer: Medicare Other | Source: Ambulatory Visit | Attending: Pulmonary Disease | Admitting: Pulmonary Disease

## 2022-02-10 ENCOUNTER — Other Ambulatory Visit: Payer: Medicare Other

## 2022-02-10 DIAGNOSIS — Z87891 Personal history of nicotine dependence: Secondary | ICD-10-CM

## 2022-02-15 ENCOUNTER — Ambulatory Visit (INDEPENDENT_AMBULATORY_CARE_PROVIDER_SITE_OTHER): Payer: Medicare Other | Admitting: Pulmonary Disease

## 2022-02-15 ENCOUNTER — Encounter (HOSPITAL_BASED_OUTPATIENT_CLINIC_OR_DEPARTMENT_OTHER): Payer: Self-pay | Admitting: Pulmonary Disease

## 2022-02-15 VITALS — BP 116/82 | HR 67 | Temp 98.1°F | Ht 71.0 in | Wt 195.0 lb

## 2022-02-15 DIAGNOSIS — G4733 Obstructive sleep apnea (adult) (pediatric): Secondary | ICD-10-CM | POA: Diagnosis not present

## 2022-02-15 DIAGNOSIS — J439 Emphysema, unspecified: Secondary | ICD-10-CM | POA: Diagnosis not present

## 2022-02-15 DIAGNOSIS — J432 Centrilobular emphysema: Secondary | ICD-10-CM | POA: Diagnosis not present

## 2022-02-15 NOTE — Assessment & Plan Note (Signed)
CT chest showed mild emphysema. Will obtain PFTs to evaluate.  He only reports mild dyspnea that bronchodilators will be necessary

## 2022-02-15 NOTE — Patient Instructions (Signed)
x schedule PFTs for mild emphysema  For mild dryness, and options include -Increase humidity -trial of nasal cradle mask -Chinstrap

## 2022-02-15 NOTE — Assessment & Plan Note (Signed)
CPAP download was reviewed which shows excellent control of events on auto CPAP with average pressure of 9 and maximum pressure of 10.5 cm.  He is very compliant more than 8 hours per night.  CPAP is only helped improve his daytime somnolence and fatigue. CPAP supplies will be renewed for a year. For his mild dryness options include increasing humidity, using nasal cradle mask or going to a chinstrap.  Mild leak is not significant

## 2022-02-15 NOTE — Progress Notes (Signed)
   Subjective:    Patient ID: James [name], male    DOB: 1955/05/10, 67 y.o.   MRN: [id number]  HPI  67 yo ex-smoker  for FU of OSA on CPAP He started smoking at age 80, smoked about a pack per day until he quit in 2016, more than 40 pack years.  His wife has metastatic lung cancer. They make an annual trip to [place]   PMH - ulcerative colitis , rhinitis medicamentosa  Chief Complaint  Patient presents with   Follow-up    Pt states his mouth and throat drys out when using. Pt would like to discuss the Insire.   Annual follow-up visit. He continues to be compliant with his CPAP machine.  He is settled down with nasal pillows. He complains of dryness of his mouth.  No problems with mask or pressure he is interested in the inspire device Initial office visit 01/2021 we sent in a prescription for a new device. His wife has metastatic lung cancer We reviewed screening CT chest study  Significant tests/ events reviewed  LDCT chest 01/2022 mild emphysema, 2 mm right middle lobe nodule, coronary artery calcification HST 10/07/15 >> AHI 48     Review of Systems neg for any significant sore throat, dysphagia, itching, sneezing, nasal congestion or excess/ purulent secretions, fever, chills, sweats, unintended wt loss, pleuritic or exertional cp, hempoptysis, orthopnea pnd or change in chronic leg swelling. Also denies presyncope, palpitations, heartburn, abdominal pain, nausea, vomiting, diarrhea or change in bowel or urinary habits, dysuria,hematuria, rash, arthralgias, visual complaints, headache, numbness weakness or ataxia.     Objective:   Physical Exam  Gen. Pleasant, well-nourished, in no distress ENT - no thrush, no pallor/icterus,no post nasal drip Neck: No JVD, no thyromegaly, no carotid bruits Lungs: no use of accessory muscles, no dullness to percussion, clear without rales or rhonchi  Cardiovascular: Rhythm regular, heart sounds  normal, no murmurs or gallops, no  peripheral edema Musculoskeletal: No deformities, no cyanosis or clubbing        Assessment & Plan:

## 2022-02-16 ENCOUNTER — Encounter (HOSPITAL_BASED_OUTPATIENT_CLINIC_OR_DEPARTMENT_OTHER): Payer: Self-pay

## 2022-03-24 ENCOUNTER — Encounter: Payer: Self-pay | Admitting: Radiology

## 2022-03-29 ENCOUNTER — Other Ambulatory Visit: Payer: Medicare Other

## 2022-03-29 DIAGNOSIS — E7800 Pure hypercholesterolemia, unspecified: Secondary | ICD-10-CM

## 2022-03-29 NOTE — Progress Notes (Signed)
Patient Care Team: Elby Showers, MD as PCP - General (Internal Medicine)  Visit Date: 04/05/22  Subjective:    Patient ID: James [name] , Male   DOB: [birth year], 67 y.o.    MRN: [id number]   67 y.o. Male presents today for 6 month follow-up. Patient has a past medical history of anemia, aortic atherosclerosis, Barrett's esophagus, cancer, colitis, degenerative disc disease, diverticulosis, GERD, hiatal hernia, colonic polyps, hyperlipidemia, internal hemorrhoids, lower back pain, right inguinal hernia, sleep apnea, tubular adenoma.  Reports having frequent rhinorrhea  and nasal congestion. I think Afrin may be causing some rebound nasal congestion. Has not had allergy testing. Using generic Afrin at night.Needs to discontinue this and just use Flonase nasal spray on a regular basis.  Had an episode of vertigo last year. Had one episode more recently this year. These episodes lasted 60-90 seconds and he was standing when they began. Feels dizzy and lightheaded during these episodes. Denies associated sweating, nausea, headache. No headache or visual disturbance. Has a fair amount of anxiety as he is his wife's primary care giver.  Reports concerns with memory loss.  Denies weakness, numbness, chest pain, shortness of breath.  History of colitis treated with Lialda 4.8 g daily with breakfast. Followed by gastroenterologist, Dr. Zenovia Jarred.  History of GERD treated with Protonix 40 mg daily.  History of hypercholesterolemia treated with Crestor 5 mg daily. CHOL elevated at 205 and LDL at 100 on 03/29/22, lipid panel otherwise normal.  History of sleep apnea treated with CPAP.  History of anxiety treated with Xanax 0.25 mg twice daily as needed.  Social history: Wife continues to be treated at [facility] for metastatic lung cancer. He worries very much about her health. They were able to take a trip to the [place] in early 2024. Still operating printing  company.   Past Medical History:  Diagnosis Date   Anemia    Aortic atherosclerosis ([place])    Barrett's esophagus    Cancer ([place])    skin   Colitis    Degenerative disc disease    Diverticulosis 2013   Colonoscopy   GERD (gastroesophageal reflux disease)    Hiatal hernia    Hx of colonic polyps 2013   Colonoscopy- tubular adenoma, and hyperplastic    Hyperlipidemia    Internal hemorrhoids 2013   Colonoscopy   Lower back pain    Right inguinal hernia    Sleep apnea 2015   on CPAP   Tubular adenoma      Family History  Problem Relation Age of Onset   Colon cancer Mother        late 71s   Heart disease Father    Colon cancer Father 17   Esophageal cancer Father        late 78's   Stomach cancer Neg Hx    AAA (abdominal aortic aneurysm) Neg Hx    Rectal cancer Neg Hx    Colon polyps Neg Hx     Social History   Social History Narrative   Not on file      Review of Systems  Constitutional:  Negative for fever and malaise/fatigue.  HENT:  Negative for congestion.        (+) Rhinorrhea  Eyes:  Negative for blurred vision.  Respiratory:  Negative for cough and shortness of breath.   Cardiovascular:  Negative for chest pain, palpitations and leg swelling.  Gastrointestinal:  Negative for vomiting.  Musculoskeletal:  Negative for  back pain.  Skin:  Negative for rash.  Neurological:  Negative for loss of consciousness, weakness and headaches.       (-) Numbness        Objective:   Vitals: BP 124/80   Pulse 66   Temp 98.2 F (36.8 C) (Tympanic)   Ht 5\' 11"  (1.803 m)   Wt 196 lb (88.9 kg)   SpO2 97%   BMI 27.34 kg/m    Physical Exam Vitals and nursing note reviewed.  Constitutional:      General: He is not in acute distress.    Appearance: Normal appearance. He is not ill-appearing.  HENT:     Head: Normocephalic and atraumatic.     Nose:     Comments: Boggy nasal mucosa.    Mouth/Throat:     Pharynx: Oropharynx is clear.  Eyes:      Extraocular Movements: Extraocular movements intact.     Right eye: No nystagmus.     Left eye: No nystagmus.     Pupils: Pupils are equal, round, and reactive to light.     Comments: No nystagmus  Pulmonary:     Effort: Pulmonary effort is normal.  Musculoskeletal:     Comments: Muscle strength 5/5 in all groups tested.  Skin:    General: Skin is warm and dry.  Neurological:     General: No focal deficit present.     Mental Status: He is alert and oriented to person, place, and time. Mental status is at baseline.     Cranial Nerves: Cranial nerves 2-12 are intact.     Sensory: Sensation is intact.     Deep Tendon Reflexes: Reflexes are normal and symmetric.     Reflex Scores:      Bicep reflexes are 2+ on the right side and 2+ on the left side.      Brachioradialis reflexes are 2+ on the right side and 2+ on the left side.      Patellar reflexes are 2+ on the right side and 2+ on the left side.    Comments: No facial muscle weakness.  Psychiatric:        Mood and Affect: Mood normal.        Behavior: Behavior normal.        Thought Content: Thought content normal.        Judgment: Judgment normal.       Results:   Studies obtained and personally reviewed by me:   Labs:       Component Value Date/Time   NA 142 09/30/2021 1122   K 4.3 09/30/2021 1122   CL 112 (H) 09/30/2021 1122   CO2 21 09/30/2021 1122   GLUCOSE 86 09/30/2021 1122   BUN 16 09/30/2021 1122   CREATININE 0.85 09/30/2021 1122   CALCIUM 9.8 09/30/2021 1122   PROT 7.2 03/29/2022 1126   ALBUMIN 3.8 07/24/2019 1238   AST 20 03/29/2022 1126   ALT 12 03/29/2022 1126   ALKPHOS 47 07/24/2019 1238   BILITOT 0.7 03/29/2022 1126   GFRNONAA >60 05/01/2021 0327   GFRNONAA 91 08/15/2019 1056   GFRAA 106 08/15/2019 1056     Lab Results  Component Value Date   WBC 4.9 09/30/2021   HGB 14.1 09/30/2021   HCT 42.0 09/30/2021   MCV 94.6 09/30/2021   PLT 255 09/30/2021    Lab Results  Component Value Date    CHOL 205 (H) 03/29/2022   HDL 91 03/29/2022   LDLCALC 100 (H)  03/29/2022   TRIG 62 03/29/2022   CHOLHDL 2.3 03/29/2022    No results found for: "HGBA1C"   Lab Results  Component Value Date   TSH 1.71 09/29/2020     Lab Results  Component Value Date   PSA 1.73 09/30/2021   PSA 1.53 09/29/2020   PSA 2.37 03/26/2020      Assessment & Plan:   Vertigo: referral to [facility]rology.Episodic vertigo has worried him. His wife has metastatic lung cancer. He  says  he has a lot of situational stress and needs to know he is healthy. He has some memory concerns as well.  Ear congestion persistent:  Consider referral to Allergist. Use Flonase daily in morning and try not to use Afrin at night.  Colitis: treated with Lialda 4.8 g daily with breakfast. Followed by gastroenterologist, Dr. Zenovia Jarred.  GERD: treated with Protonix 40 mg daily.  Hypercholesterolemia: treated with Crestor 5 mg daily. CHOL elevated at 205 and LDL at 100 on 03/29/22, lipid panel otherwise normal.  Sleep apnea: treated with CPAP.  Anxiety: treated with Xanax 0.25 mg twice daily as needed.   Return in 6 months for health maintenance exam.    I,Alexander Ruley,acting as a scribe for Elby Showers, MD.,have documented all relevant documentation on the behalf of Elby Showers, MD,as directed by  Elby Showers, MD while in the presence of Elby Showers, MD.   I, Elby Showers, MD, have reviewed all documentation for this visit. The documentation on 04/05/22 for the exam, diagnosis, procedures, and orders are all accurate and complete.

## 2022-03-30 LAB — HEPATIC FUNCTION PANEL
AG Ratio: 1.9 (calc) (ref 1.0–2.5)
ALT: 12 U/L (ref 9–46)
AST: 20 U/L (ref 10–35)
Albumin: 4.7 g/dL (ref 3.6–5.1)
Alkaline phosphatase (APISO): 45 U/L (ref 35–144)
Bilirubin, Direct: 0.1 mg/dL (ref 0.0–0.2)
Globulin: 2.5 g/dL (calc) (ref 1.9–3.7)
Indirect Bilirubin: 0.6 mg/dL (calc) (ref 0.2–1.2)
Total Bilirubin: 0.7 mg/dL (ref 0.2–1.2)
Total Protein: 7.2 g/dL (ref 6.1–8.1)

## 2022-03-30 LAB — LIPID PANEL
Cholesterol: 205 mg/dL — ABNORMAL HIGH (ref <200)
HDL: 91 mg/dL (ref >=40)
LDL Cholesterol (Calc): 100 mg/dL (calc) — ABNORMAL HIGH
Non-HDL Cholesterol (Calc): 114 mg/dL (calc) (ref <130)
Total CHOL/HDL Ratio: 2.3 (calc) (ref <5.0)
Triglycerides: 62 mg/dL (ref <150)

## 2022-04-05 ENCOUNTER — Encounter: Payer: Self-pay | Admitting: Internal Medicine

## 2022-04-05 ENCOUNTER — Ambulatory Visit (INDEPENDENT_AMBULATORY_CARE_PROVIDER_SITE_OTHER): Payer: Medicare Other | Admitting: Internal Medicine

## 2022-04-05 VITALS — BP 124/80 | HR 66 | Temp 98.2°F | Ht 71.0 in | Wt 196.0 lb

## 2022-04-05 DIAGNOSIS — F439 Reaction to severe stress, unspecified: Secondary | ICD-10-CM

## 2022-04-05 DIAGNOSIS — J302 Other seasonal allergic rhinitis: Secondary | ICD-10-CM

## 2022-04-05 DIAGNOSIS — R0981 Nasal congestion: Secondary | ICD-10-CM

## 2022-04-05 DIAGNOSIS — F411 Generalized anxiety disorder: Secondary | ICD-10-CM | POA: Diagnosis not present

## 2022-04-05 DIAGNOSIS — K51318 Ulcerative (chronic) rectosigmoiditis with other complication: Secondary | ICD-10-CM

## 2022-04-05 DIAGNOSIS — G4733 Obstructive sleep apnea (adult) (pediatric): Secondary | ICD-10-CM | POA: Diagnosis not present

## 2022-04-05 DIAGNOSIS — E7800 Pure hypercholesterolemia, unspecified: Secondary | ICD-10-CM

## 2022-04-05 DIAGNOSIS — H8110 Benign paroxysmal vertigo, unspecified ear: Secondary | ICD-10-CM | POA: Diagnosis not present

## 2022-04-05 NOTE — Patient Instructions (Addendum)
Referral Neurologist regarding vertigo episodes and memory concerns.  Referral to Allergist for chronic nasal congestion. Needs to wean off of Afrin by starting to use Flonase nasal spray regularly.

## 2022-04-16 ENCOUNTER — Other Ambulatory Visit: Payer: Self-pay | Admitting: Orthopaedic Surgery

## 2022-04-21 ENCOUNTER — Telehealth: Payer: Self-pay | Admitting: Internal Medicine

## 2022-04-21 ENCOUNTER — Other Ambulatory Visit: Payer: Self-pay | Admitting: Internal Medicine

## 2022-04-21 NOTE — Telephone Encounter (Signed)
Inbound call from patient requesting a refill for mesalamine .please advise

## 2022-04-21 NOTE — Telephone Encounter (Signed)
Pt needs office visit (as indicated on his previous rx) for additional refills. Thank you

## 2022-05-18 DIAGNOSIS — Z0289 Encounter for other administrative examinations: Secondary | ICD-10-CM

## 2022-07-27 ENCOUNTER — Other Ambulatory Visit: Payer: Self-pay | Admitting: Internal Medicine

## 2022-07-27 ENCOUNTER — Other Ambulatory Visit: Payer: Self-pay | Admitting: Orthopaedic Surgery

## 2022-08-26 ENCOUNTER — Telehealth: Payer: Self-pay | Admitting: Internal Medicine

## 2022-08-26 ENCOUNTER — Other Ambulatory Visit: Payer: Self-pay | Admitting: Internal Medicine

## 2022-08-26 MED ORDER — MESALAMINE 1000 MG RE SUPP: RECTAL | 0 refills | Status: DC

## 2022-08-26 NOTE — Telephone Encounter (Signed)
I left James [name] a voice mail message on # [phone] that his suppositories have been sent in to the [facility] on [address] and that we will see him in October at his office visit.

## 2022-08-26 NOTE — Telephone Encounter (Signed)
Inbound call from patient requesting refill for mesalamine suppository. Patient has been scheduled for next available 10/22. Requesting for a refill until then. Please advise, thank you.

## 2022-09-24 ENCOUNTER — Other Ambulatory Visit: Payer: Self-pay | Admitting: Internal Medicine

## 2022-10-03 ENCOUNTER — Other Ambulatory Visit: Payer: Self-pay | Admitting: Internal Medicine

## 2022-10-04 ENCOUNTER — Other Ambulatory Visit: Payer: Medicare Other

## 2022-10-04 DIAGNOSIS — Z0000 Encounter for general adult medical examination without abnormal findings: Secondary | ICD-10-CM

## 2022-10-04 DIAGNOSIS — Z125 Encounter for screening for malignant neoplasm of prostate: Secondary | ICD-10-CM

## 2022-10-04 DIAGNOSIS — Z87898 Personal history of other specified conditions: Secondary | ICD-10-CM

## 2022-10-04 DIAGNOSIS — G4733 Obstructive sleep apnea (adult) (pediatric): Secondary | ICD-10-CM

## 2022-10-04 DIAGNOSIS — F411 Generalized anxiety disorder: Secondary | ICD-10-CM

## 2022-10-04 DIAGNOSIS — E7800 Pure hypercholesterolemia, unspecified: Secondary | ICD-10-CM

## 2022-10-04 DIAGNOSIS — Z79899 Other long term (current) drug therapy: Secondary | ICD-10-CM

## 2022-10-05 LAB — CBC WITH DIFFERENTIAL/PLATELET
Absolute Monocytes: 515 {cells}/uL (ref 200–950)
Basophils Absolute: 18 {cells}/uL (ref 0–200)
Basophils Relative: 0.4 %
Eosinophils Absolute: 83 {cells}/uL (ref 15–500)
Eosinophils Relative: 1.8 %
HCT: 42.6 % (ref 38.5–50.0)
Hemoglobin: 14.1 g/dL (ref 13.2–17.1)
Lymphs Abs: 1431 {cells}/uL (ref 850–3900)
MCH: 31.8 pg (ref 27.0–33.0)
MCHC: 33.1 g/dL (ref 32.0–36.0)
MCV: 95.9 fL (ref 80.0–100.0)
MPV: 13.4 fL — ABNORMAL HIGH (ref 7.5–12.5)
Monocytes Relative: 11.2 %
Neutro Abs: 2553 {cells}/uL (ref 1500–7800)
Neutrophils Relative %: 55.5 %
Platelets: 253 10*3/uL (ref 140–400)
RBC: 4.44 10*6/uL (ref 4.20–5.80)
RDW: 11.6 % (ref 11.0–15.0)
Total Lymphocyte: 31.1 %
WBC: 4.6 10*3/uL (ref 3.8–10.8)

## 2022-10-11 ENCOUNTER — Ambulatory Visit: Payer: Medicare Other

## 2022-10-11 ENCOUNTER — Ambulatory Visit: Payer: Medicare Other | Admitting: Internal Medicine

## 2022-10-11 ENCOUNTER — Encounter: Payer: Self-pay | Admitting: Internal Medicine

## 2022-10-11 VITALS — BP 110/80 | HR 75 | Ht 71.0 in | Wt 183.0 lb

## 2022-10-11 DIAGNOSIS — F411 Generalized anxiety disorder: Secondary | ICD-10-CM

## 2022-10-11 DIAGNOSIS — K2100 Gastro-esophageal reflux disease with esophagitis, without bleeding: Secondary | ICD-10-CM

## 2022-10-11 DIAGNOSIS — E7800 Pure hypercholesterolemia, unspecified: Secondary | ICD-10-CM

## 2022-10-11 DIAGNOSIS — J302 Other seasonal allergic rhinitis: Secondary | ICD-10-CM

## 2022-10-11 DIAGNOSIS — F439 Reaction to severe stress, unspecified: Secondary | ICD-10-CM

## 2022-10-11 DIAGNOSIS — K51318 Ulcerative (chronic) rectosigmoiditis with other complication: Secondary | ICD-10-CM | POA: Diagnosis not present

## 2022-10-11 DIAGNOSIS — Z96641 Presence of right artificial hip joint: Secondary | ICD-10-CM

## 2022-10-11 DIAGNOSIS — G4733 Obstructive sleep apnea (adult) (pediatric): Secondary | ICD-10-CM

## 2022-10-11 DIAGNOSIS — Z0000 Encounter for general adult medical examination without abnormal findings: Secondary | ICD-10-CM | POA: Diagnosis not present

## 2022-10-11 LAB — POCT URINALYSIS DIP (CLINITEK)
Bilirubin, UA: NEGATIVE
Glucose, UA: NEGATIVE mg/dL
Nitrite, UA: NEGATIVE
POC PROTEIN,UA: NEGATIVE
Spec Grav, UA: 1.01 (ref 1.010–1.025)
Urobilinogen, UA: 0.2 E.U./dL
pH, UA: 6 (ref 5.0–8.0)

## 2022-10-14 NOTE — Patient Instructions (Signed)
We are sorry to learn your wife is not doing well in the hospital. Counseled patient about situation today. Labs are stable. Continue current medications and return in one year or as needed.

## 2022-11-08 ENCOUNTER — Ambulatory Visit: Payer: Medicare Other | Admitting: Physician Assistant

## 2022-12-03 ENCOUNTER — Other Ambulatory Visit: Payer: Self-pay | Admitting: Internal Medicine

## 2022-12-21 ENCOUNTER — Telehealth: Payer: Self-pay | Admitting: Internal Medicine

## 2022-12-21 ENCOUNTER — Other Ambulatory Visit: Payer: Self-pay | Admitting: Internal Medicine

## 2022-12-21 MED ORDER — MESALAMINE 1.2 G PO TBEC: DELAYED_RELEASE_TABLET | ORAL | 0 refills | Status: DC

## 2022-12-21 NOTE — Telephone Encounter (Signed)
Prescription sent to patient's pharmacy until scheduled appt. Patient notified.

## 2022-12-21 NOTE — Telephone Encounter (Signed)
Patient called and stated that he is really in need of his medication refill Mesalamine 1.2 G EC Tablet. Patient also stated that if possible of the Mesalamine to be sent over to [facility] on [address] [address]. Please Advise.

## 2022-12-29 ENCOUNTER — Other Ambulatory Visit: Payer: Self-pay | Admitting: Orthopaedic Surgery

## 2023-02-01 ENCOUNTER — Ambulatory Visit: Payer: Medicare Other | Admitting: Gastroenterology

## 2023-03-03 ENCOUNTER — Telehealth: Payer: Self-pay | Admitting: Internal Medicine

## 2023-03-03 ENCOUNTER — Other Ambulatory Visit: Payer: Self-pay | Admitting: Internal Medicine

## 2023-03-03 MED ORDER — MESALAMINE 1.2 G PO TBEC: DELAYED_RELEASE_TABLET | ORAL | 0 refills | Status: DC

## 2023-03-03 MED ORDER — MESALAMINE 1000 MG RE SUPP: RECTAL | 0 refills | Status: DC

## 2023-03-03 NOTE — Telephone Encounter (Signed)
Informed patient that I sent the prescription to patient's pharmacy and apologized for the error. Patient verbalized understanding.

## 2023-03-03 NOTE — Telephone Encounter (Signed)
Informed patient that we can send a refill to his pharmacy until his scheduled appt but to keep his appt for future refills. Patient verbalized understanding.

## 2023-03-03 NOTE — Addendum Note (Signed)
Addended by: Illene Bolus on: 03/03/2023 04:01 PM   Modules accepted: Orders

## 2023-03-03 NOTE — Telephone Encounter (Signed)
Patient called and stated that he received that wrong prescription. Patient stated that he is needing the mesalamine suppositories and not the tablets. Patient is requesting a call back. Please advise.

## 2023-03-03 NOTE — Telephone Encounter (Signed)
Patient called and stated that he would like to know if his medication for mesalamine will be able to be sent over even if he has not seen Dr. Rhea Belton as of yet. Patient is requesting a call back. Please advise.

## 2023-03-16 ENCOUNTER — Encounter: Payer: Self-pay | Admitting: Internal Medicine

## 2023-03-16 ENCOUNTER — Ambulatory Visit (INDEPENDENT_AMBULATORY_CARE_PROVIDER_SITE_OTHER): Payer: Medicare Other | Admitting: Internal Medicine

## 2023-03-16 ENCOUNTER — Other Ambulatory Visit (INDEPENDENT_AMBULATORY_CARE_PROVIDER_SITE_OTHER): Payer: Medicare Other

## 2023-03-16 VITALS — BP 108/72 | HR 62 | Ht 71.0 in | Wt 190.0 lb

## 2023-03-16 DIAGNOSIS — D500 Iron deficiency anemia secondary to blood loss (chronic): Secondary | ICD-10-CM

## 2023-03-16 DIAGNOSIS — K51511 Left sided colitis with rectal bleeding: Secondary | ICD-10-CM

## 2023-03-16 DIAGNOSIS — K2270 Barrett's esophagus without dysplasia: Secondary | ICD-10-CM

## 2023-03-16 DIAGNOSIS — R4586 Emotional lability: Secondary | ICD-10-CM | POA: Diagnosis not present

## 2023-03-16 DIAGNOSIS — K219 Gastro-esophageal reflux disease without esophagitis: Secondary | ICD-10-CM

## 2023-03-16 LAB — CBC WITH DIFFERENTIAL/PLATELET
Basophils Absolute: 0 10*3/uL (ref 0.0–0.1)
Basophils Relative: 0.8 % (ref 0.0–3.0)
Eosinophils Absolute: 0.1 10*3/uL (ref 0.0–0.7)
Eosinophils Relative: 1.6 % (ref 0.0–5.0)
HCT: 41.6 % (ref 39.0–52.0)
Hemoglobin: 13.8 g/dL (ref 13.0–17.0)
Lymphocytes Relative: 27.8 % (ref 12.0–46.0)
Lymphs Abs: 1.5 10*3/uL (ref 0.7–4.0)
MCHC: 33.3 g/dL (ref 30.0–36.0)
MCV: 94.8 fL (ref 78.0–100.0)
Monocytes Absolute: 0.6 10*3/uL (ref 0.1–1.0)
Monocytes Relative: 12.2 % — ABNORMAL HIGH (ref 3.0–12.0)
Neutro Abs: 3.1 10*3/uL (ref 1.4–7.7)
Neutrophils Relative %: 57.6 % (ref 43.0–77.0)
Platelets: 264 10*3/uL (ref 150.0–400.0)
RBC: 4.39 Mil/uL (ref 4.22–5.81)
RDW: 13.6 % (ref 11.5–15.5)
WBC: 5.3 10*3/uL (ref 4.0–10.5)

## 2023-03-16 LAB — COMPREHENSIVE METABOLIC PANEL
ALT: 16 U/L (ref 0–53)
AST: 24 U/L (ref 0–37)
Albumin: 4.6 g/dL (ref 3.5–5.2)
Alkaline Phosphatase: 51 U/L (ref 39–117)
BUN: 20 mg/dL (ref 6–23)
CO2: 27 meq/L (ref 19–32)
Calcium: 10 mg/dL (ref 8.4–10.5)
Chloride: 105 meq/L (ref 96–112)
Creatinine, Ser: 1 mg/dL (ref 0.40–1.50)
GFR: 77.19 mL/min (ref >60.00)
Glucose, Bld: 108 mg/dL — ABNORMAL HIGH (ref 70–99)
Potassium: 5.1 meq/L (ref 3.5–5.1)
Sodium: 140 meq/L (ref 135–145)
Total Bilirubin: 0.6 mg/dL (ref 0.2–1.2)
Total Protein: 7.7 g/dL (ref 6.0–8.3)

## 2023-03-16 LAB — FERRITIN: Ferritin: 27.8 ng/mL (ref 22.0–322.0)

## 2023-03-16 NOTE — Patient Instructions (Addendum)
 Your provider has requested that you go to the basement level for lab work before leaving today. Press "B" on the elevator. The lab is located at the first door on the left as you exit the elevator.  Continue mesalamine, canasa suppositories and pantoprazole at current doses.  Please follow up with Dr. Rhea Belton in 6-12 months.   _______________________________________________________  If your blood pressure at your visit was 140/90 or greater, please contact your primary care physician to follow up on this.  _______________________________________________________  If you are age 53 or older, your body mass index should be between 23-30. Your Body mass index is 26.5 kg/m. If this is out of the aforementioned range listed, please consider follow up with your Primary Care Provider.  If you are age 32 or younger, your body mass index should be between 19-25. Your Body mass index is 26.5 kg/m. If this is out of the aformentioned range listed, please consider follow up with your Primary Care Provider.   ________________________________________________________  The [facility] providers would like to encourage you to use [facility] to communicate with providers for non-urgent requests or questions.  Due to long hold times on the telephone, sending your provider a message by [facility] may be a faster and more efficient way to get a response.  Please allow 48 business hours for a response.  Please remember that this is for non-urgent requests.  _______________________________________________________

## 2023-03-16 NOTE — Progress Notes (Signed)
 Subjective:    Patient ID: James [name], male    DOB: 1955/04/25, 68 y.o.   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  He is here alone today.  He was last here on 05/19/2021.  He experiences variable bowel movements, ranging from one to three times a day, with stools that are sometimes formed but narrow, and often disintegrate upon flushing. About half the time, his stools are solid. He had significant right lower abdominal pain a few months ago, which has since resolved. He had a single episode of rectal bleeding several months ago. No current abdominal pain and his appetite is 'too good'.  His current medication regimen for ulcerative colitis includes four Lialda tablets daily and Canasa suppositories, which he rarely misses. He is not currently using Miralax. He recalls a history of anemia but notes that his most recent blood work showed no anemia.  He continues to care for his wife who his continued her battle with cancer.  She had an ICU stay in October and it was feared that she would not recover but she has recovered and he is still enjoying her time with her.  He gets emotional on a regular basis and wonders if he could have low testosterone or if another med might help this symptom.  He plans to discuss it with Dr. Lenord Fellers when he sees here for follow-up   Review of Systems As per HPI, otherwise negative    Objective:   Physical Exam BP 108/72   Pulse 62   Ht 5\' 11"  (1.803 m)   Wt 190 lb (86.2 kg)   BMI 26.50 kg/m  Gen: awake, alert, NAD HEENT: anicteric  Ext: no c/c/e Neuro: nonfocal   EGD and colonoscopy 03/15/2021: EGD revealed short segment Barrett's  esophagus which was biopsied.  This confirms Barrett's without dysplasia.  2 cm hiatal hernia.  Normal stomach and examined duodenum.  Colonoscopy normal terminal ileum, inflammation was patchy and moderate in the rectum, descending colon, transverse colon.  The rectosigmoid colon, mid sigmoid, distal sigmoid, proximal transverse colon, ascending colon and cecum were spared.  Graded as Mayo 2.  Polyp removed from the cecum.  Polyp removed from the rectum.  Diverticulosis in the ascending and left colon.  Small hemorrhoids. Pathology from colonoscopy = cecum sessile serrated polyp without dysplasia; ascending colon and cecum unremarkable without colitis, transverse unremarkable colonic mucosa, sigmoid and descending unremarkable colonic mucosa, rectum moderate active chronic colitis.  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  Left-sided ulcerative Colitis (dx 2018) Chronic inflammation with scarring noted in previous colonoscopy. Currently on Lialda 4.8 g daily and Canasa 1 g nightly with variable bowel habits. Discussed potential addition of Entyvio if inflammation is still active. -Order fecal calprotectin to assess current level of inflammation. -Consider Entyvio if calprotectin is elevated, with potential to reduce Lialda and Canasa if effective. -Surveillance colonoscopy in 2026  GERD with Barrett's esophagus with no dysplasia -Continue pantoprazole  40 mg daily -Surveillance colonoscopy in 2026   IDA Previously related to UC - Repeat CBC and ferritin today  Emotional Lability Reports increased emotional sensitivity, not necessarily linked to depression or anxiety.   Certainly reason for anxiety with his wife's health care issues. -Consider discussing with primary care provider for potential hormonal evaluation (rule out low testosterone) or initiation of SSRI for emotional stability.

## 2023-03-24 ENCOUNTER — Other Ambulatory Visit

## 2023-03-27 LAB — CALPROTECTIN, FECAL: Calprotectin, Fecal: 13 ug/g (ref 0–120)

## 2023-03-31 IMAGING — CT CT CHEST LUNG CANCER SCREENING LOW DOSE W/O CM
1 series · 10 of 10 positions shown, 13 images · non-contrast
Comparison: None.

CLINICAL DATA: Former smoker, 68 pack-year history.



[ct lung segmentation data · axial · 0.77mm/px · z∈[-336,-336]mm · 10 of 341 frames shown]
[frame 1/341  mediastinal]
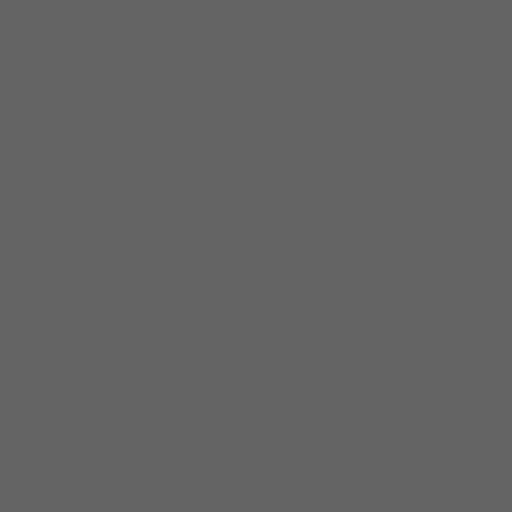
[frame 1/341  lung]
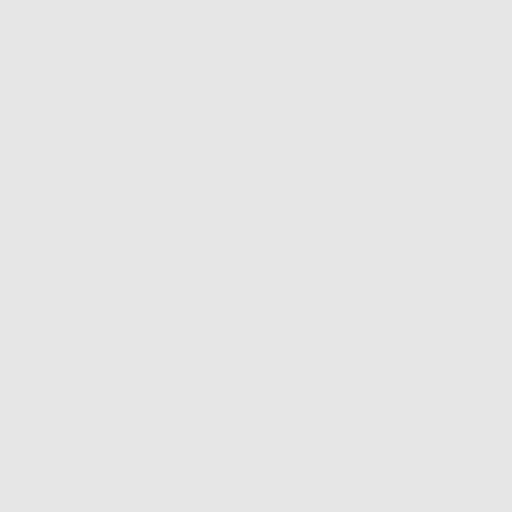
[frame 38/341  lung]
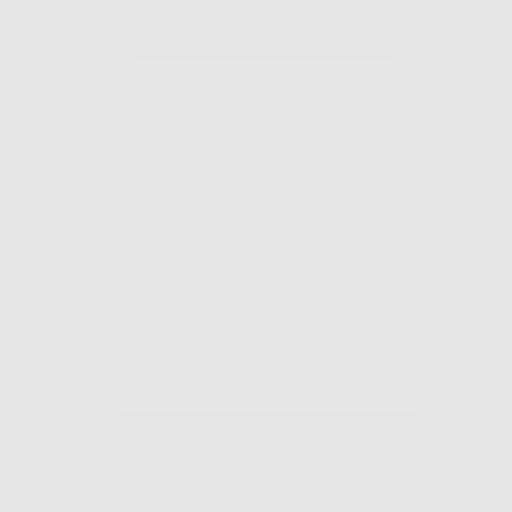
[frame 76/341  lung]
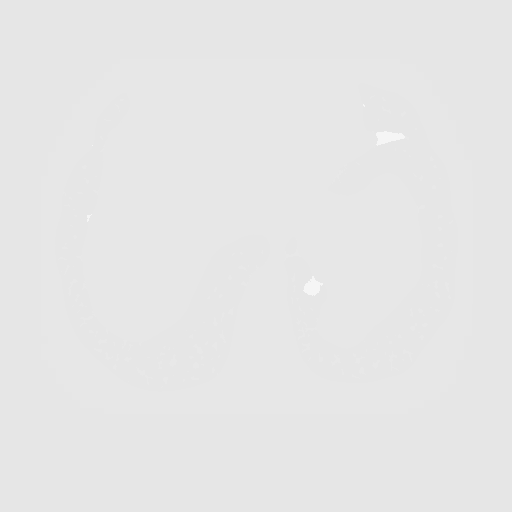
[frame 114/341  lung]
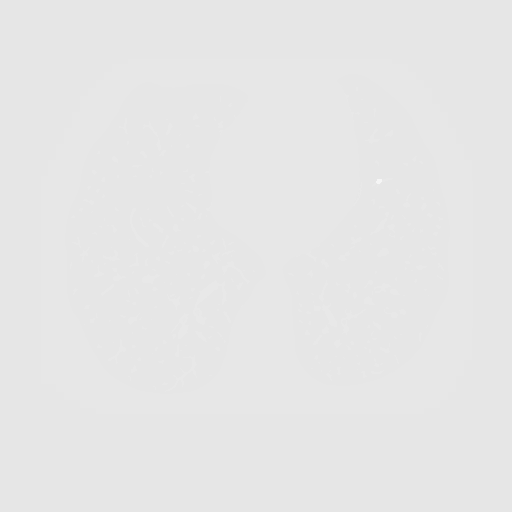
[frame 152/341  mediastinal]
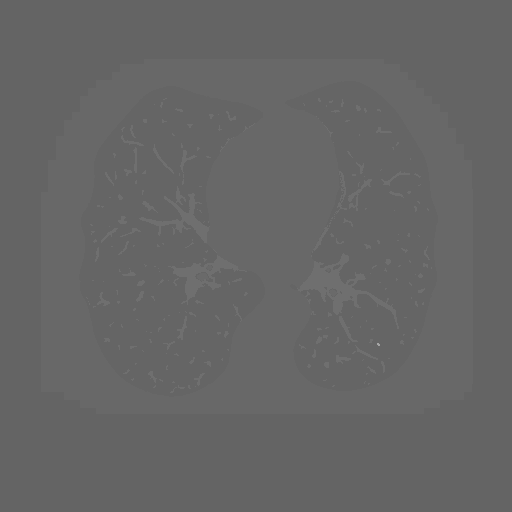
[frame 152/341  lung]
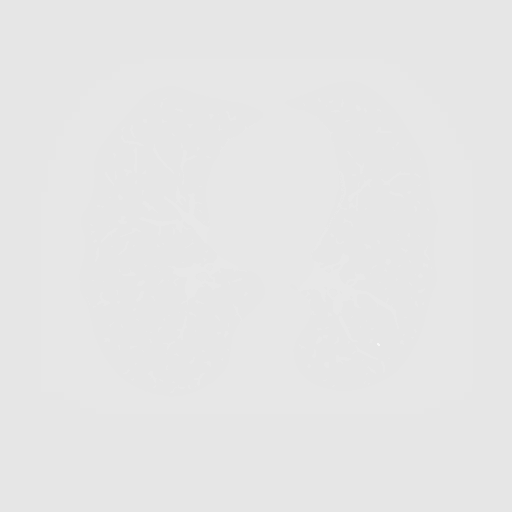
[frame 189/341  lung]
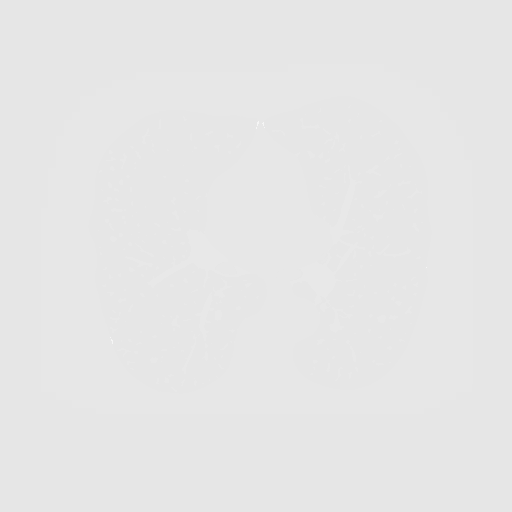
[frame 227/341  lung]
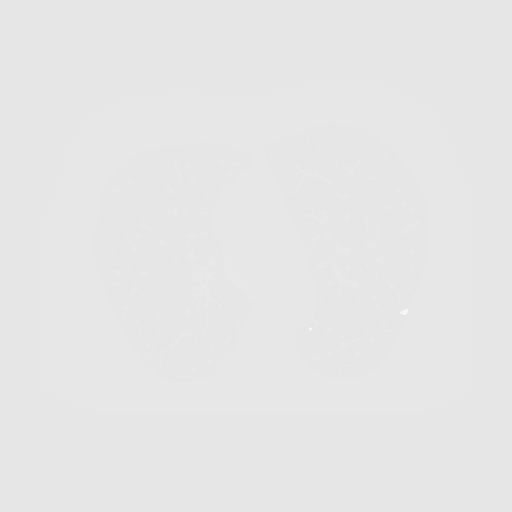
[frame 265/341  lung]
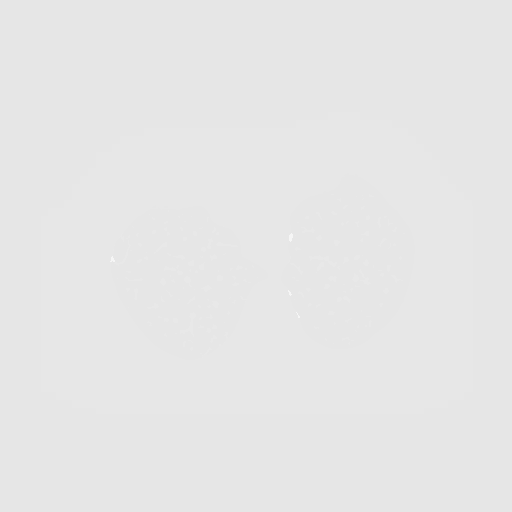
[frame 303/341  mediastinal]
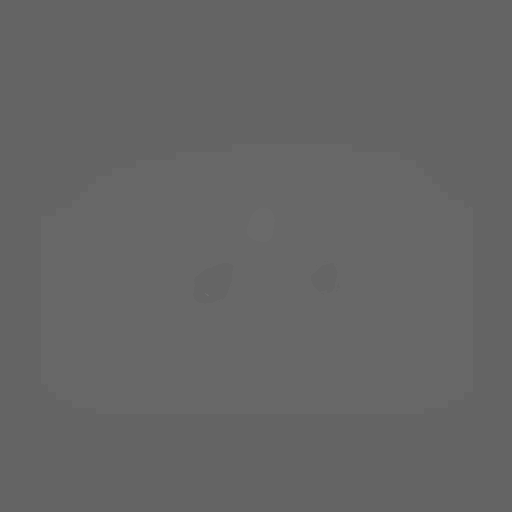
[frame 303/341  lung]
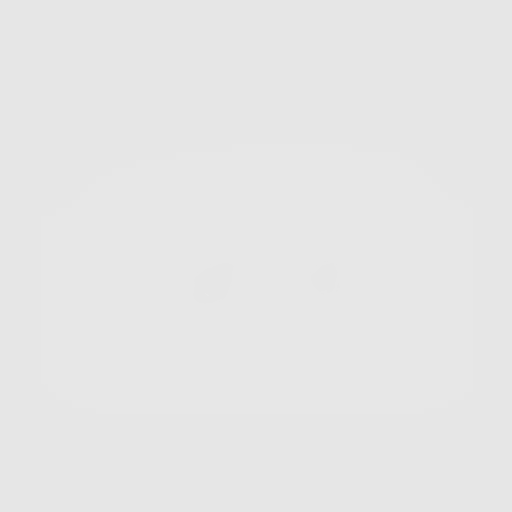
[frame 341/341  lung]
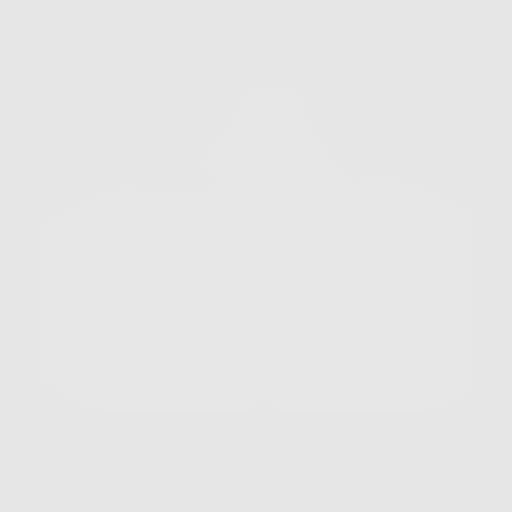

[10 of 10 positions shown; findings below may reference images not displayed]

FINDINGS: Cardiovascular: Atherosclerotic calcification of the aortic valve
and coronary arteries. Pulmonic trunk is enlarged. Heart size
normal. No pericardial effusion.

Mediastinum/Nodes: No pathologically enlarged mediastinal or
axillary lymph nodes. Hilar regions are difficult to definitively
evaluate without IV contrast but appear grossly unremarkable.
Esophagus is grossly unremarkable.

Lungs/Pleura: Centrilobular and paraseptal emphysema. Calcified
granulomas. No suspicious pulmonary nodules. No pleural fluid.
Airway is unremarkable.

Upper Abdomen: Visualized portions of the liver, gallbladder,
adrenal glands, kidneys, spleen, pancreas, stomach and bowel are
grossly unremarkable. No upper abdominal adenopathy.

Musculoskeletal: Degenerative changes in the spine. No worrisome
lytic or sclerotic lesions.
IMPRESSION: 1. Lung-RADS 1, negative. Continue annual screening with low-dose
chest CT without contrast in 12 months.
2. Aortic atherosclerosis (RNL7H-KPI.I). Coronary artery
calcification.
3. Enlarged pulmonic trunk, indicative of pulmonary arterial
hypertension.
4.  Emphysema (RNL7H-1KE.N).

## 2023-04-15 ENCOUNTER — Other Ambulatory Visit: Payer: Self-pay | Admitting: Internal Medicine

## 2023-06-25 ENCOUNTER — Other Ambulatory Visit: Payer: Self-pay | Admitting: Internal Medicine

## 2023-07-10 ENCOUNTER — Other Ambulatory Visit: Payer: Self-pay | Admitting: Orthopaedic Surgery

## 2023-09-05 ENCOUNTER — Encounter: Payer: Self-pay | Admitting: Internal Medicine

## 2023-09-06 ENCOUNTER — Telehealth (INDEPENDENT_AMBULATORY_CARE_PROVIDER_SITE_OTHER): Admitting: Internal Medicine

## 2023-09-06 ENCOUNTER — Encounter: Payer: Self-pay | Admitting: Internal Medicine

## 2023-09-06 DIAGNOSIS — J069 Acute upper respiratory infection, unspecified: Secondary | ICD-10-CM | POA: Diagnosis not present

## 2023-09-06 DIAGNOSIS — F439 Reaction to severe stress, unspecified: Secondary | ICD-10-CM

## 2023-09-06 MED ORDER — AZITHROMYCIN 250 MG PO TABS: ORAL_TABLET | ORAL | 0 refills | Status: AC

## 2023-09-06 NOTE — Progress Notes (Signed)
   Subjective:    Patient ID: James [name], male    DOB: [birth year], 68 y.o.   MRN: [id number]  HPI I connected today with Carlin Scarce Daryll) Gaut via interactive audio and video telecommunications.  He has come down with an acute respiratory infection.  His wife is currently hospitalized with pneumonia at [facility].  She has a history of lung cancer metastatic to the brain and is under the care of Oncology.  Patient has considerable situational stress.  Has been able to keep wife at home with assistance.  We spent some time talking about her situation and expectations that are reasonable.  Have encouraged him to discuss his concerns with Dr. Federico, Oncologist   Location: Patient is at his home and I am at my office.  He is agreeable to visit in this format today.  Patient has developed upper respiratory infection symptoms which had onset last night with headache, stuffy nose and had some sore throat yesterday.  Today, he feels symptoms are slightly improved.  No documented fever.  However he is concerned that symptoms may worsen.  No shaking chills or productive cough.  Was last seen for health maintenance exam October 11, 2022.  Patient has a history of sleep apnea, Barrett's esophagus,GERD, aortic atherosclerosis,BPH, hyperlipidemia.He has a CPAP device.    Review of Systems no nausea, vomiting, headache or shaking chills     Objective:   Physical Exam  He is seen virtually and does not appear to be short of breath when speaking with me.  He sounds a bit nasally congested.  No cough noted on this visit.      Assessment & Plan:  Acute upper respiratory infection  Plan: Given his situation with wife hospitalized with metastatic lung cancer, I am sending in a Zithromax  Z-PAK for him to take 2 tabs on day 1 followed by 1 tab days 2 through 5.  He is agreeable to this.  He is to rest is much as possible and stay well-hydrated.  He is to call if he has any further questions or  concerns or not improving within 48 to 72 hours or sooner if worse.  Ronal Norleen Hailstone, MD  I, Ronal JINNY Hailstone, MD, have reviewed all documentation for this visit. The documentation on 09/06/2023 for the exam, diagnosis, procedures, and orders are all accurate and complete.

## 2023-09-06 NOTE — Patient Instructions (Signed)
 We are sorry you are not feeling well.  I have prescribed Zithromax  Z-PAK to take 2 tabs day 1 followed by 1 tab days 2 through 5.  Rest is much as possible and stay well-hydrated.  Call back if not improving in 48 hours or sooner if worse.  May take Tylenol  for fever or headache.

## 2023-09-21 ENCOUNTER — Other Ambulatory Visit: Payer: Self-pay | Admitting: Internal Medicine

## 2023-09-25 ENCOUNTER — Encounter: Payer: Self-pay | Admitting: Internal Medicine

## 2023-10-03 ENCOUNTER — Ambulatory Visit (INDEPENDENT_AMBULATORY_CARE_PROVIDER_SITE_OTHER): Admitting: Family Medicine

## 2023-10-03 ENCOUNTER — Encounter (HOSPITAL_BASED_OUTPATIENT_CLINIC_OR_DEPARTMENT_OTHER): Payer: Self-pay | Admitting: Family Medicine

## 2023-10-03 VITALS — BP 138/91 | HR 59 | Ht 71.0 in | Wt 184.2 lb

## 2023-10-03 DIAGNOSIS — M791 Myalgia, unspecified site: Secondary | ICD-10-CM | POA: Insufficient documentation

## 2023-10-03 DIAGNOSIS — M79601 Pain in right arm: Secondary | ICD-10-CM | POA: Diagnosis not present

## 2023-10-03 NOTE — Progress Notes (Signed)
 Procedures performed today:    None.  Independent interpretation of notes and tests performed by another provider:   None.  Brief History, Exam, Impression, and Recommendations:    BP (!) 138/91 (BP Location: Right Arm, Patient Position: Sitting, Cuff Size: Normal)   Pulse (!) 59   Ht 5' 11 (1.803 m)   Wt 184 lb 3.2 oz (83.6 kg)   SpO2 97%   BMI 25.69 kg/m   Discussed the use of AI scribe software for clinical note transcription with the patient, who gave verbal consent to proceed.  History of Present Illness James [name] is a 68 year old male who presents with right arm pain and discomfort in the shoulder blade area.  He has been experiencing ongoing pain in the right arm, specifically in the biceps and triceps area, for approximately six months. The pain improved somewhat during a recent inpatient hospital and nursing home rehabilitation stay due to decreased use of the arm. However, activities such as slow dancing, where he places his arms under another's arms, have aggravated the pain. The pain is sometimes quite severe, particularly with regular activity. No past injuries to the arm or shoulder and no numbness or tingling. He has used Chenodal topically a couple of times for temporary relief. He is right-handed and experiences pain in the lower and center areas of the arm, particularly when pressure is applied. He sometimes wakes up at night due to shoulder blade discomfort but not due to arm pain.  He mentions discomfort in the shoulder blade area, which is separate from the arm pain. This discomfort occurs when leaning back, especially if the chair back is below the shoulder blades, and sometimes when sleeping. Pressure on the shoulder blade area can be very uncomfortable, and the pain can shoot signals upward. He recalls a past incident where something appeared to stick out when bending over, which was observed by his wife and investigated with a scan that did not  reveal anything unusual. He recalls a scan done approximately six years ago at [facility], which included an MRI of the shoulder. The shoulder blade discomfort can be triggered by direct pressure and may extend pain outward from the spot.  He has been lifting weights while lying on his back to help build muscle, which seemed beneficial, but has not done so for the past month. He notes a difference in range of motion between his arms, with the right arm not reaching as far as the left when extended. He inquires about the possibility of using a brace for support but is unsure if it would be helpful.  On exam, Right shoulder: Obvious swelling, bruising or erythema: absent Deformity of the shoulder: absent does have mild winging of the scapula with certain movements. Does have some mild tenderness to palpation in the periscapular muscle near inferomedial border of right scapula.  No significant tenderness to palpation through biceps or triceps. Active ROM: Very slight reduction in range of motion Passive ROM: Slight reduction in range of motion Strength: normal/normal Empty can: Negative Hawkins: Negative Neer's: Negative Lift off test: Negative Speeds test: Negative O'Brien's maneuver: Negative Apprehension testing: Negative Neurovascular exam: intact  Right arm pain Assessment & Plan: Chronic pain in the right upper arm, localized to biceps and triceps, likely due to muscle strain or overuse injury. - Refer to physical therapy for evaluation and treatment, including ultrasound therapy, dry needling, and soft tissue mobilization. - Advise on home exercises and topical treatments as  recommended by physical therapy.  Orders: -     Ambulatory referral to Physical Therapy  Trigger point Assessment & Plan: Intermittent right periscapular pain, likely due to myofascial trigger point. No structural abnormalities on imaging. - Refer to physical therapy for treatment of periscapular myofascial  pain, including trigger point release and topical treatments.  Orders: -     Ambulatory referral to Physical Therapy  Return in about 3 months (around 01/02/2024) for right arm.   ___________________________________________ Maritsa Hunsucker de [place], MD, ABFM, CAQSM Primary Care and Sports Medicine [facility]

## 2023-10-03 NOTE — Assessment & Plan Note (Signed)
 Chronic pain in the right upper arm, localized to biceps and triceps, likely due to muscle strain or overuse injury. - Refer to physical therapy for evaluation and treatment, including ultrasound therapy, dry needling, and soft tissue mobilization. - Advise on home exercises and topical treatments as recommended by physical therapy.

## 2023-10-03 NOTE — Patient Instructions (Signed)
  Medication Instructions:  Your physician recommends that you continue on your current medications as directed. Please refer to the Current Medication list given to you today. --If you need a refill on any your medications before your next appointment, please call your pharmacy first. If no refills are authorized on file call the office.--   Referrals/Procedures/Imaging: Physical Therapy   Follow-Up: Your next appointment:   Your physician recommends that you schedule a follow-up appointment in: 3 month follow up ORTHO with Dr. de [place]  You will receive a text message or e-mail with a link to a survey about your care and experience with us  today! We would greatly appreciate your feedback!   Thanks for letting us  be apart of your health journey!!  Primary Care and Sports Medicine   Dr. Quintin sheerer Peru   We encourage you to activate your patient portal called MyChart.  Sign up information is provided on this After Visit Summary.  MyChart is used to connect with patients for Virtual Visits (Telemedicine).  Patients are able to view lab/test results, encounter notes, upcoming appointments, etc.  Non-urgent messages can be sent to your provider as well. To learn more about what you can do with MyChart, please visit --  [email or web address].

## 2023-10-03 NOTE — Assessment & Plan Note (Signed)
 Intermittent right periscapular pain, likely due to myofascial trigger point. No structural abnormalities on imaging. - Refer to physical therapy for treatment of periscapular myofascial pain, including trigger point release and topical treatments.

## 2023-10-04 ENCOUNTER — Encounter: Payer: Self-pay | Admitting: Rehabilitation

## 2023-10-04 ENCOUNTER — Ambulatory Visit: Attending: Family Medicine | Admitting: Rehabilitation

## 2023-10-04 ENCOUNTER — Other Ambulatory Visit: Payer: Self-pay

## 2023-10-04 DIAGNOSIS — R293 Abnormal posture: Secondary | ICD-10-CM | POA: Diagnosis present

## 2023-10-04 DIAGNOSIS — M79601 Pain in right arm: Secondary | ICD-10-CM | POA: Diagnosis present

## 2023-10-04 DIAGNOSIS — M791 Myalgia, unspecified site: Secondary | ICD-10-CM | POA: Insufficient documentation

## 2023-10-04 NOTE — Therapy (Signed)
 OUTPATIENT PHYSICAL THERAPY UPPER EXTREMITY EVALUATION   Patient Name: James [name] MRN: [id number] DOB:06-May-1955, 68 y.o., male Today's Date: 10/05/2023  END OF SESSION:  PT End of Session - 10/05/23 1029     Visit Number 1    Number of Visits 13    Date for Recertification  11/16/23    Authorization Type none    PT Start Time 1615    PT Stop Time 1700    PT Time Calculation (min) 45 min    Activity Tolerance Patient tolerated treatment well    Behavior During Therapy [facility] for tasks assessed/performed          Past Medical History:  Diagnosis Date   Anemia    Aortic atherosclerosis (HCC)    Barrett's esophagus    Cancer (HCC)    skin   Colitis    Degenerative disc disease    Diverticulosis 2013   Colonoscopy   GERD (gastroesophageal reflux disease)    Hiatal hernia    Hx of colonic polyps 2013   Colonoscopy- tubular adenoma, and hyperplastic    Hyperlipidemia    Internal hemorrhoids 2013   Colonoscopy   Lower back pain    Right inguinal hernia    Sleep apnea 2015   on CPAP   Tubular adenoma    Past Surgical History:  Procedure Laterality Date   COLONOSCOPY     HERNIA REPAIR  1980   left inguinal   KNEE ARTHROSCOPY  2018   TOTAL HIP ARTHROPLASTY Right 03/14/2014   Procedure: RIGHT TOTAL HIP ARTHROPLASTY ANTERIOR APPROACH ;  Surgeon: Lonni CINDERELLA Poli, MD;  Location: [facility];  Service: Orthopedics;  Laterality: Right;   TOTAL HIP ARTHROPLASTY Left 04/30/2021   Procedure: LEFT TOTAL HIP ARTHROPLASTY ANTERIOR APPROACH;  Surgeon: Poli Lonni CINDERELLA, MD;  Location: [facility];  Service: Orthopedics;  Laterality: Left;   WISDOM TOOTH EXTRACTION     Patient Active Problem List   Diagnosis Date Noted   Right arm pain 10/03/2023   Trigger point 10/03/2023   Centrilobular emphysema (HCC) 02/15/2022   Status post left hip replacement 04/30/2021   Unilateral primary osteoarthritis, left hip 04/29/2021   CSR (central serous retinopathy) 03/28/2019    Status post arthroscopic knee surgery 08/09/2017   Unilateral primary osteoarthritis, left knee 04/10/2017   Status post arthroscopy of left knee 03/23/2017   Barrett's esophagus 05/30/2016   OSA (obstructive sleep apnea) 12/23/2015   Exertional dyspnea 12/23/2015   Status post total replacement of right hip 03/14/2014   Osteoarthritis of right hip 03/04/2014   Allergic rhinitis 03/04/2014   History of adenomatous polyp of colon 08/19/2011   Left sided colitis (HCC) 08/19/2011   GE reflux 11/14/2010   Low back pain 11/14/2010   History of smoking 11/14/2010   Hyperlipidemia 11/14/2010    PCP: Ronal Hailstone, MD  REFERRING PROVIDER: Raymond de Peru, MD  REFERRING DIAG:  Diagnosis  M79.601 (ICD-10-CM) - Right arm pain  M79.10 (ICD-10-CM) - Trigger point    THERAPY DIAG:  Right arm pain  Abnormal posture  Rationale for Evaluation and Treatment: Rehabilitation  ONSET DATE: 6 months ago   SUBJECTIVE:  SUBJECTIVE STATEMENT: Ongoing Rt arm and shoulder blade area pain x almost 6 months.  The pain in the arm happens with transferring my wife or lifting something heavy.   I also have pain in the scapula/shoulder blade that feels like a spot I can touch.  I am staying in the rehab facility with my wife currently.  It can hurt even if I just hold my mug in that hand for awhile.   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  She is currently in rehab.   PAIN:  Are you having pain? Yes: NPRS scale: The arm pain up to 6/10  Pain location: mid bicep belly  Pain description: a sharp pain  Aggravating factors: using the arm and lifting  Relieving factors: bengay   PAIN:  Are you having pain? Yes: NPRS scale: The scapula pain up to 4/10  Pain  location: rhomboid Pain description: dull and nagging  Aggravating factors: pressure on that spot only and when sleeping   Relieving factors: just not pushing on it.    PRECAUTIONS: None  RED FLAGS: None   WEIGHT BEARING RESTRICTIONS: No  FALLS:  Has patient fallen in last 6 months? 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  DIAGNOSTIC FINDINGS:  Rt shoulder MRI in 2019 showing Rt UT prominence but no explanation for palpable abnormality. (Pt reports his wife noticed his shoulder blade looked like it was popping out but it was not painful at that time)   PATIENT SURVEYS :  Quick Dash:  QUICK DASH  Please rate your ability do the following activities in the last week by selecting the number below the appropriate response.   Activities Rating  Open a tight or new jar.  1 = No difficulty   Do heavy household chores (e.g., wash walls, floors). 1 = No difficulty   Carry a shopping bag or briefcase 1 = No difficulty   Wash your back. 1 = No difficulty   Use a knife to cut food. 1 = No difficulty   Recreational activities in which you take some force or impact through your arm, shoulder or hand (e.g., golf, hammering, tennis, etc.). 2 = Mild difficulty  During the past week, to what extent has your arm, shoulder or hand problem interfered with your normal social activities with family, friends, neighbors or groups?  1 = Not at all  During the past week, were you limited in your work or other regular daily activities as a result of your arm, shoulder or hand problem? 1 = Not limited at all  Rate the severity of the following symptoms in the  last week: Arm, Shoulder, or hand pain. 4 = Severe  Rate the severity of the following symptoms in the last week: Tingling (pins and needles) in your arm, shoulder or hand. 1 = none  During the past week, how much difficulty have you had sleeping because of the pain in your arm, shoulder or hand?  2 = Mild difficulty   (A QuickDASH score may not be calculated if there is greater than 1 missing item.)  Quick Hollis  Disability/Symptom Score: [(sum of 16 (n) responses/11 (n)] x 25 = 36  Minimally Clinically Important Difference (MCID): 15-20 points  (Franchignoni, F. et al. (2013). Minimally clinically important difference of the disabilities of the arm, shoulder, and hand outcome measures (DASH) and its shortened version (Quick DASH). Journal of Orthopaedic & Sports Physical Therapy, 44(1), 30-39)   COGNITION: Overall cognitive status: Within functional limits for tasks assessed     SENSATION: WFL  POSTURE: In standing: stands with arms behind torso, appearance of possible scoliosis but pt is not aware of this, Rt scapula more protracted and upwardly rotated compared to the Lt.    UPPER EXTREMITY ROM:  All ROM WNL without pain: scapular dyskinesia noted with active flexion and abduction   UPPER EXTREMITY MMT:  MMT Right eval Left eval  Shoulder flexion 5 5  Shoulder extension 5 5  Shoulder abduction 5 pn at rhomboid 5  Shoulder adduction    Shoulder internal rotation 5 5  Shoulder external rotation 5 5  Middle trapezius 4+ 4+  Lower trapezius 4 4  Elbow flexion 5 5  Elbow extension 5 5  Wrist flexion    Wrist extension    Wrist ulnar deviation    Wrist radial deviation    Wrist pronation    Wrist supination    Grip strength (lbs)    (Blank rows = not tested)  SHOULDER SPECIAL TESTS: All noted negative at MD visit yesterday.   JOINT MOBILITY TESTING:  Rt shoulder more elevated in supine   PALPATION:  No ttp in the Rt arm, +1 ttp to the Rt rhomboid and under the  medial border of the mid scapula  Pain with CPA of T4, T3, T2, C7 Pain in the neck prone with rotation to the Left but not Rt                                                                                                                             TREATMENT DATE:  10/05/23 Eval performed  Discussed POC and DN fees    PATIENT EDUCATION: Education details: per today's note Person educated: Patient Education method: Explanation Education comprehension: verbalized understanding  HOME EXERCISE PROGRAM:   ASSESSMENT:  CLINICAL IMPRESSION: Patient is a 68 y.o. male who was seen today for physical therapy evaluation and treatment for his chronic Rt arm pain and scapular pain.  Testing today shows abnormality in the resting position of his scapula and Ues in standing with increased scapular protraction and upward rotation as well as GH forward tilt.  The pain in his rhomboid was reproduced with shoulder abduction MMT and palpation to the muscle as well as with CPA to the upper thoracic spine.  We were unable to reproduce the pain in the Rt arm which he notes is usually mid bicep or distal deltoid region and happens mostly with transferring his wife in bed.  He does not have much weakness with MMT but may have more functional  weakness with doing combined movements.  He would benefit from PT to decrease thoracic and cervical stiffness, improve endurance and functional strength of the scapular muscles and decrease pain. .    OBJECTIVE IMPAIRMENTS: decreased activity tolerance, decreased knowledge of condition, impaired flexibility, impaired UE functional use, and postural dysfunction.   ACTIVITY LIMITATIONS: lifting  PARTICIPATION LIMITATIONS: none  PERSONAL FACTORS: Time since onset of injury/illness/exacerbation are also affecting patient's functional outcome.   REHAB POTENTIAL: Good  CLINICAL DECISION MAKING: Stable/uncomplicated  EVALUATION COMPLEXITY: Low  GOALS: Goals reviewed with  patient? Yes  SHORT TERM GOALS: Target date: 10/18/23  Pt will be educated on proper lifting and transferring posture to minimize arm pain  Baseline: Goal status: INITIAL   LONG TERM GOALS: Target date: 11/16/23  Pt will have no increased rhomboid pain when sitting in a chair  Baseline:  Goal status: INITIAL  2.  Pt will have no pain in the Rt upper arm when moving his wife around in bed  Baseline:  Goal status: INITIAL  3.  Pt will be ind with final HEP for spinal mobility and scapular strengthening  Baseline:  Goal status: INITIAL   PLAN: PT FREQUENCY: 2x/week  PT DURATION: 6 weeks  PLANNED INTERVENTIONS: 97164- PT Re-evaluation, 97110-Therapeutic exercises, 97530- Therapeutic activity, [id number]- Neuromuscular re-education, 97535- Self Care, [id number]- Manual therapy, [id number]- Electrical stimulation (unattended), 97035- Ultrasound, 02987- Traction (mechanical), 20560 (1-2 muscles), 20561 (3+ muscles)- Dry Needling, and Patient/Family education, ice, heat  PLAN FOR NEXT SESSION: DN as able, spinal mobility and general Rt postural strengthening, may like some foam roll/melt , MT    Cathy Crounse R, PT 10/05/2023, 10:29 AM

## 2023-10-10 ENCOUNTER — Other Ambulatory Visit

## 2023-10-10 ENCOUNTER — Encounter: Payer: Self-pay | Admitting: Rehabilitation

## 2023-10-10 ENCOUNTER — Ambulatory Visit: Admitting: Rehabilitation

## 2023-10-10 DIAGNOSIS — Z125 Encounter for screening for malignant neoplasm of prostate: Secondary | ICD-10-CM

## 2023-10-10 DIAGNOSIS — M79601 Pain in right arm: Secondary | ICD-10-CM | POA: Diagnosis not present

## 2023-10-10 DIAGNOSIS — Z0000 Encounter for general adult medical examination without abnormal findings: Secondary | ICD-10-CM

## 2023-10-10 DIAGNOSIS — E7800 Pure hypercholesterolemia, unspecified: Secondary | ICD-10-CM

## 2023-10-10 DIAGNOSIS — R293 Abnormal posture: Secondary | ICD-10-CM

## 2023-10-10 NOTE — Therapy (Deleted)
 OUTPATIENT PHYSICAL THERAPY UPPER EXTREMITY  Patient Name: ALIOUNE HODGKIN MRN: [id number] DOB:06-01-1955, 68 y.o., male Today's Date: 10/10/2023  END OF SESSION:  PT End of Session - 10/10/23 1157     Visit Number 2    Number of Visits 13    Date for Recertification  11/16/23    Authorization Type none    PT Start Time 1200           Past Medical History:  Diagnosis Date   Anemia    Aortic atherosclerosis    Barrett's esophagus    Cancer (HCC)    skin   Colitis    Degenerative disc disease    Diverticulosis 2013   Colonoscopy   GERD (gastroesophageal reflux disease)    Hiatal hernia    Hx of colonic polyps 2013   Colonoscopy- tubular adenoma, and hyperplastic    Hyperlipidemia    Internal hemorrhoids 2013   Colonoscopy   Lower back pain    Right inguinal hernia    Sleep apnea 2015   on CPAP   Tubular adenoma    Past Surgical History:  Procedure Laterality Date   COLONOSCOPY     HERNIA REPAIR  1980   left inguinal   KNEE ARTHROSCOPY  2018   TOTAL HIP ARTHROPLASTY Right 03/14/2014   Procedure: RIGHT TOTAL HIP ARTHROPLASTY ANTERIOR APPROACH ;  Surgeon: Lonni CINDERELLA Poli, MD;  Location: [facility];  Service: Orthopedics;  Laterality: Right;   TOTAL HIP ARTHROPLASTY Left 04/30/2021   Procedure: LEFT TOTAL HIP ARTHROPLASTY ANTERIOR APPROACH;  Surgeon: Poli Lonni CINDERELLA, MD;  Location: [facility];  Service: Orthopedics;  Laterality: Left;   WISDOM TOOTH EXTRACTION     Patient Active Problem List   Diagnosis Date Noted   Right arm pain 10/03/2023   Trigger point 10/03/2023   Centrilobular emphysema (HCC) 02/15/2022   Status post left hip replacement 04/30/2021   Unilateral primary osteoarthritis, left hip 04/29/2021   CSR (central serous retinopathy) 03/28/2019   Status post arthroscopic knee surgery 08/09/2017   Unilateral primary osteoarthritis, left knee 04/10/2017   Status post arthroscopy of left knee 03/23/2017   Barrett's esophagus 05/30/2016    OSA (obstructive sleep apnea) 12/23/2015   Exertional dyspnea 12/23/2015   Status post total replacement of right hip 03/14/2014   Osteoarthritis of right hip 03/04/2014   Allergic rhinitis 03/04/2014   History of adenomatous polyp of colon 08/19/2011   Left sided colitis (HCC) 08/19/2011   GE reflux 11/14/2010   Low back pain 11/14/2010   History of smoking 11/14/2010   Hyperlipidemia 11/14/2010    PCP: Ronal Hailstone, MD  REFERRING PROVIDER: Raymond de Peru, MD  REFERRING DIAG:  Diagnosis  M79.601 (ICD-10-CM) - Right arm pain  M79.10 (ICD-10-CM) - Trigger point    THERAPY DIAG:  Right arm pain  Abnormal posture  Rationale for Evaluation and Treatment: Rehabilitation  ONSET DATE: 6 months ago   SUBJECTIVE:  SUBJECTIVE STATEMENT: Patient reports his shoulder is feeling okay. Over the weekend, he has performed movements that previously caused pain in his bicep/triceps did not cause pain. This included pulling the chuckpad underneath his wife from right to left and up towards the top of the bed.    EVAL: Ongoing Rt arm and shoulder blade area pain x almost 6 months.  The pain in the arm happens with transferring my wife or lifting something heavy.   I also have pain in the scapula/shoulder blade that feels like a spot I can touch.  I am staying in the rehab facility with my wife currently.  It can hurt even if I just hold my mug in that hand for awhile.   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  She is currently in rehab.   PAIN:  Are you having pain? Yes: NPRS scale: The arm pain up to 6/10  Pain location: mid bicep belly  Pain description: a sharp pain  Aggravating factors: using the arm and lifting  Relieving factors: bengay    PAIN:  Are you having pain? Yes: NPRS scale: The scapula pain up to 4/10  Pain location: rhomboid Pain description: dull and nagging  Aggravating factors: pressure on that spot only and when sleeping   Relieving factors: just not pushing on it.    PRECAUTIONS: None  RED FLAGS: None   WEIGHT BEARING RESTRICTIONS: No  FALLS:  Has patient fallen in last 6 months? 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  DIAGNOSTIC FINDINGS:  Rt shoulder MRI in 2019 showing Rt UT prominence but no explanation for palpable abnormality. (Pt reports his wife noticed his shoulder blade looked like it was popping out but it was not painful at that time)   PATIENT SURVEYS :  Quick Dash:  QUICK DASH  Please rate your ability do the following activities in the last week by selecting the number below the appropriate response.   Activities Rating  Open a tight or new jar.  1 = No difficulty   Do heavy household chores (e.g., wash walls, floors). 1 = No difficulty   Carry a shopping bag or briefcase 1 = No difficulty   Wash your back. 1 = No difficulty   Use a knife to cut food. 1 = No difficulty   Recreational activities in which you take some force or impact through your arm, shoulder or hand (e.g., golf, hammering, tennis, etc.). 2 = Mild difficulty  During the past week, to what extent has your arm, shoulder or hand problem interfered with your normal social activities with family, friends, neighbors or groups?  1 = Not at all  During the past week, were you limited in your work or other regular daily activities as a result of your arm, shoulder or hand  problem? 1 = Not limited at all  Rate the severity of the following symptoms in the last week: Arm, Shoulder, or hand pain. 4 = Severe  Rate the severity of the following symptoms in the last week: Tingling (pins and needles) in your arm, shoulder or hand. 1 =  none  During the past week, how much difficulty have you had sleeping because of the pain in your arm, shoulder or hand?  2 = Mild difficulty   (A QuickDASH score may not be calculated if there is greater than 1 missing item.)  Quick Dash Disability/Symptom Score: [(sum of 16 (n) responses/11 (n)] x 25 = 36  Minimally Clinically Important Difference (MCID): 15-20 points  (Franchignoni, F. et al. (2013). Minimally clinically important difference of the disabilities of the arm, shoulder, and hand outcome measures (DASH) and its shortened version (Quick DASH). Journal of Orthopaedic & Sports Physical Therapy, 44(1), 30-39)   COGNITION: Overall cognitive status: Within functional limits for tasks assessed     SENSATION: WFL  POSTURE: In standing: stands with arms behind torso, appearance of possible scoliosis but pt is not aware of this, Rt scapula more protracted and upwardly rotated compared to the Lt.    UPPER EXTREMITY ROM:  All ROM WNL without pain: scapular dyskinesia noted with active flexion and abduction   UPPER EXTREMITY MMT:  MMT Right eval Left eval  Shoulder flexion 5 5  Shoulder extension 5 5  Shoulder abduction 5 pn at rhomboid 5  Shoulder adduction    Shoulder internal rotation 5 5  Shoulder external rotation 5 5  Middle trapezius 4+ 4+  Lower trapezius 4 4  Elbow flexion 5 5  Elbow extension 5 5  Wrist flexion    Wrist extension    Wrist ulnar deviation    Wrist radial deviation    Wrist pronation    Wrist supination    Grip strength (lbs)    (Blank rows = not tested)  SHOULDER SPECIAL TESTS: All noted negative at MD visit yesterday.   JOINT MOBILITY TESTING:  Rt shoulder more elevated in supine    PALPATION:  No ttp in the Rt arm, +1 ttp to the Rt rhomboid and under the medial border of the mid scapula  Pain with CPA of T4, T3, T2, C7 Pain in the neck prone with rotation to the Left but not Rt                                                                                                                             TREATMENT DATE:  10/10/23 UBE Scapular mobilizations STM to rhomboids and scapular borders Thoracic mob Manual scapular resisted motions / isometrics Shoulder isometrics and thoracic extension updated for HEP   10/05/23 Eval performed  Discussed POC and DN fees    PATIENT EDUCATION: Education details: per today's note Person educated: Patient Education method: Explanation Education comprehension: verbalized understanding  HOME EXERCISE PROGRAM:   ASSESSMENT:  CLINICAL IMPRESSION:   OBJECTIVE IMPAIRMENTS: decreased activity tolerance, decreased knowledge of condition, impaired flexibility, impaired UE functional use, and postural dysfunction.   ACTIVITY LIMITATIONS: lifting  PARTICIPATION LIMITATIONS: none  PERSONAL FACTORS: Time since onset of injury/illness/exacerbation are also affecting patient's functional outcome.   REHAB POTENTIAL: Good  CLINICAL DECISION  MAKING: Stable/uncomplicated  EVALUATION COMPLEXITY: Low  GOALS: Goals reviewed with patient? Yes  SHORT TERM GOALS: Target date: 10/18/23  Pt will be educated on proper lifting and transferring posture to minimize arm pain  Baseline: Goal status: INITIAL   LONG TERM GOALS: Target date: 11/16/23  Pt will have no increased rhomboid pain when sitting in a chair  Baseline:  Goal status: INITIAL  2.  Pt will have no pain in the Rt upper arm when moving his wife around in bed  Baseline:  Goal status: INITIAL  3.  Pt will be ind with final HEP for spinal mobility and scapular strengthening  Baseline:  Goal status: INITIAL   PLAN: PT FREQUENCY: 2x/week  PT DURATION: 6  weeks  PLANNED INTERVENTIONS: 97164- PT Re-evaluation, 97110-Therapeutic exercises, 97530- Therapeutic activity, [id number]- Neuromuscular re-education, 97535- Self Care, 02859- Manual therapy, [id number]- Electrical stimulation (unattended), 97035- Ultrasound, 02987- Traction (mechanical), 20560 (1-2 muscles), 20561 (3+ muscles)- Dry Needling, and Patient/Family education, ice, heat  PLAN FOR NEXT SESSION: DN as able, spinal mobility and general Rt postural strengthening, may like some foam roll/melt , MT    Keiasia Christianson R, PT 10/10/2023, 12:28 PM

## 2023-10-10 NOTE — Therapy (Addendum)
 OUTPATIENT PHYSICAL THERAPY UPPER EXTREMITY  Patient Name: James [name] MRN: [id number] DOB:1955/05/07, 68 y.o., male Today's Date: 10/10/2023  END OF SESSION:  PT End of Session - 10/10/23 1157     Visit Number 2    Number of Visits 13    Date for Recertification  11/16/23    Authorization Type none    PT Start Time 1200           Past Medical History:  Diagnosis Date   Anemia    Aortic atherosclerosis    Barrett's esophagus    Cancer (HCC)    skin   Colitis    Degenerative disc disease    Diverticulosis 2013   Colonoscopy   GERD (gastroesophageal reflux disease)    Hiatal hernia    Hx of colonic polyps 2013   Colonoscopy- tubular adenoma, and hyperplastic    Hyperlipidemia    Internal hemorrhoids 2013   Colonoscopy   Lower back pain    Right inguinal hernia    Sleep apnea 2015   on CPAP   Tubular adenoma    Past Surgical History:  Procedure Laterality Date   COLONOSCOPY     HERNIA REPAIR  1980   left inguinal   KNEE ARTHROSCOPY  2018   TOTAL HIP ARTHROPLASTY Right 03/14/2014   Procedure: RIGHT TOTAL HIP ARTHROPLASTY ANTERIOR APPROACH ;  Surgeon: Lonni CINDERELLA Poli, MD;  Location: [facility];  Service: Orthopedics;  Laterality: Right;   TOTAL HIP ARTHROPLASTY Left 04/30/2021   Procedure: LEFT TOTAL HIP ARTHROPLASTY ANTERIOR APPROACH;  Surgeon: Poli Lonni CINDERELLA, MD;  Location: [facility];  Service: Orthopedics;  Laterality: Left;   WISDOM TOOTH EXTRACTION     Patient Active Problem List   Diagnosis Date Noted   Right arm pain 10/03/2023   Trigger point 10/03/2023   Centrilobular emphysema (HCC) 02/15/2022   Status post left hip replacement 04/30/2021   Unilateral primary osteoarthritis, left hip 04/29/2021   CSR (central serous retinopathy) 03/28/2019   Status post arthroscopic knee surgery 08/09/2017   Unilateral primary osteoarthritis, left knee 04/10/2017   Status post arthroscopy of left knee 03/23/2017   Barrett's esophagus 05/30/2016    OSA (obstructive sleep apnea) 12/23/2015   Exertional dyspnea 12/23/2015   Status post total replacement of right hip 03/14/2014   Osteoarthritis of right hip 03/04/2014   Allergic rhinitis 03/04/2014   History of adenomatous polyp of colon 08/19/2011   Left sided colitis (HCC) 08/19/2011   GE reflux 11/14/2010   Low back pain 11/14/2010   History of smoking 11/14/2010   Hyperlipidemia 11/14/2010    PCP: Ronal Hailstone, MD  REFERRING PROVIDER: Raymond de Peru, MD  REFERRING DIAG:  Diagnosis  M79.601 (ICD-10-CM) - Right arm pain  M79.10 (ICD-10-CM) - Trigger point    THERAPY DIAG:  Right arm pain  Abnormal posture  Rationale for Evaluation and Treatment: Rehabilitation  ONSET DATE: 6 months ago   SUBJECTIVE:  SUBJECTIVE STATEMENT: Patient reports his shoulder is feeling okay. Over the weekend, the patient did not have pain with movements that previously caused pain in his bicep/triceps. These movements included pulling the chuckpad underneath his wife from right to left and up towards the top of the bed. He states he is feeling better since his initial evaluation.    EVAL: Ongoing Rt arm and shoulder blade area pain x almost 6 months.  The pain in the arm happens with transferring my wife or lifting something heavy.   I also have pain in the scapula/shoulder blade that feels like a spot I can touch.  I am staying in the rehab facility with my wife currently.  It can hurt even if I just hold my mug in that hand for awhile.   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  She is currently in rehab.   PAIN:  Are you having pain? Yes: NPRS scale: The arm pain up to 6/10  Pain location: mid bicep belly  Pain description: a sharp pain   Aggravating factors: using the arm and lifting  Relieving factors: bengay   PAIN:  Are you having pain? Yes: NPRS scale: The scapula pain up to 4/10  Pain location: rhomboid Pain description: dull and nagging  Aggravating factors: pressure on that spot only and when sleeping   Relieving factors: just not pushing on it.    PRECAUTIONS: None  RED FLAGS: None   WEIGHT BEARING RESTRICTIONS: No  FALLS:  Has patient fallen in last 6 months? 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  DIAGNOSTIC FINDINGS:  Rt shoulder MRI in 2019 showing Rt UT prominence but no explanation for palpable abnormality. (Pt reports his wife noticed his shoulder blade looked like it was popping out but it was not painful at that time)   PATIENT SURVEYS :  Quick Dash:  QUICK DASH  Please rate your ability do the following activities in the last week by selecting the number below the appropriate response.   Activities Rating  Open a tight or new jar.  1 = No difficulty   Do heavy household chores (e.g., wash walls, floors). 1 = No difficulty   Carry a shopping bag or briefcase 1 = No difficulty   Wash your back. 1 = No difficulty   Use a knife to cut food. 1 = No difficulty   Recreational activities in which you take some force or impact through your arm, shoulder or hand (e.g., golf, hammering, tennis, etc.). 2 = Mild difficulty  During the past week, to what extent has your arm, shoulder or hand problem interfered with your normal social activities with family, friends, neighbors or groups?  1 = Not at all  During the past week, were you limited in your work or  other regular daily activities as a result of your arm, shoulder or hand problem? 1 = Not limited at all  Rate the severity of the following symptoms in the last week: Arm, Shoulder, or hand pain. 4 = Severe  Rate the severity of the following symptoms in the last week:  Tingling (pins and needles) in your arm, shoulder or hand. 1 = none  During the past week, how much difficulty have you had sleeping because of the pain in your arm, shoulder or hand?  2 = Mild difficulty   (A QuickDASH score may not be calculated if there is greater than 1 missing item.)  Quick Dash Disability/Symptom Score: [(sum of 16 (n) responses/11 (n)] x 25 = 36  Minimally Clinically Important Difference (MCID): 15-20 points  (Franchignoni, F. et al. (2013). Minimally clinically important difference of the disabilities of the arm, shoulder, and hand outcome measures (DASH) and its shortened version (Quick DASH). Journal of Orthopaedic & Sports Physical Therapy, 44(1), 30-39)   COGNITION: Overall cognitive status: Within functional limits for tasks assessed     SENSATION: WFL  POSTURE: In standing: stands with arms behind torso, appearance of possible scoliosis but pt is not aware of this, Rt scapula more protracted and upwardly rotated compared to the Lt.    UPPER EXTREMITY ROM:  All ROM WNL without pain: scapular dyskinesia noted with active flexion and abduction   UPPER EXTREMITY MMT:  MMT Right eval Left eval  Shoulder flexion 5 5  Shoulder extension 5 5  Shoulder abduction 5 pn at rhomboid 5  Shoulder adduction    Shoulder internal rotation 5 5  Shoulder external rotation 5 5  Middle trapezius 4+ 4+  Lower trapezius 4 4  Elbow flexion 5 5  Elbow extension 5 5  Wrist flexion    Wrist extension    Wrist ulnar deviation    Wrist radial deviation    Wrist pronation    Wrist supination    Grip strength (lbs)    (Blank rows = not tested)  SHOULDER SPECIAL TESTS: All noted negative at MD visit  yesterday.   JOINT MOBILITY TESTING:  Rt shoulder more elevated in supine   PALPATION:  No ttp in the Rt arm, +1 ttp to the Rt rhomboid and under the medial border of the mid scapula  Pain with CPA of T4, T3, T2, C7 Pain in the neck prone with rotation to the Left but not Rt                                                                                                                             TREATMENT DATE:  10/10/23 Manual therapy:  Scapular mobilizations - upward rotation, downward rotation, retraction  STM to rhomboids and scapular borders Thoracic mobs - CPA & UPA Grade III-IV T2 - T8 Therapeutic Exercise:  UBE 2'/2' each direction  R LS stretch 3 x 20  R Rhomboid stretch 3 x 20 Seated thoracic extension with half foam roller 1 x 5 with 10 hold with vcs to decrease excessive neck extension due to pain here.   Scapular retractions 2 x 10  10/05/23 Eval performed  Discussed POC and DN fees    PATIENT EDUCATION: Education details: per today's note Person educated: Patient Education  method: Explanation Education comprehension: verbalized understanding  HOME EXERCISE PROGRAM: Access Code: [email or web address] URL: [email or web address] Date: 10/10/2023 Prepared by: Saddie Raw  Exercises - Seated Scapular Retraction  - 2-3 x daily - 7 x weekly - 1 sets - 10 reps - 2-3 hold - Seated Thoracic Lumbar Extension with Pectoralis Stretch  - 2-3 x daily - 7 x weekly - 1 sets - 5-10 reps - 10 sec hold - Standing Shoulder Posterior Capsule Stretch  - 2-3 x daily - 7 x weekly - 1 sets - 1-3 reps - 20-30 seconds hold - Seated Levator Scapulae Stretch  - 2-3 x daily - 7 x weekly - 1 sets - 1-3 reps - 20-30 seconds hold  ASSESSMENT:  CLINICAL IMPRESSION: Patient tolerates the addition of manual therapy with reduced pain and ease of motion following. Patient reported mild tenderness along the paraspinals and a reproduction of pain in the scapula with CPA and UPA's from T4 to  T8. Verbal cueing is required for rhomboid stretch and scapula squeezes for the patient to maintain the proper technique and not raise his shoulder towards his ear. An increase in pain in the cervical spine is reported when performing seated thoracic extension but the pain reduced when reducing the amount of cervical extension. The patient is educated and demonstrates the addition of exercises to his HEP. The patient will benefit from continued skilled physical therapy to address the impairments listed below.   OBJECTIVE IMPAIRMENTS: decreased activity tolerance, decreased knowledge of condition, impaired flexibility, impaired UE functional use, and postural dysfunction.   ACTIVITY LIMITATIONS: lifting  PARTICIPATION LIMITATIONS: none  PERSONAL FACTORS: Time since onset of injury/illness/exacerbation are also affecting patient's functional outcome.   REHAB POTENTIAL: Good  CLINICAL DECISION MAKING: Stable/uncomplicated  EVALUATION COMPLEXITY: Low  GOALS: Goals reviewed with patient? Yes  SHORT TERM GOALS: Target date: 10/18/23  Pt will be educated on proper lifting and transferring posture to minimize arm pain  Baseline: Goal status: INITIAL   LONG TERM GOALS: Target date: 11/16/23  Pt will have no increased rhomboid pain when sitting in a chair  Baseline:  Goal status: INITIAL  2.  Pt will have no pain in the Rt upper arm when moving his wife around in bed  Baseline:  Goal status: INITIAL  3.  Pt will be ind with final HEP for spinal mobility and scapular strengthening  Baseline:  Goal status: INITIAL   PLAN: PT FREQUENCY: 2x/week  PT DURATION: 6 weeks  PLANNED INTERVENTIONS: 97164- PT Re-evaluation, 97110-Therapeutic exercises, 97530- Therapeutic activity, [id number]- Neuromuscular re-education, 97535- Self Care, [id number]- Manual therapy, [id number]- Electrical stimulation (unattended), 97035- Ultrasound, 02987- Traction (mechanical), 20560 (1-2 muscles), 20561 (3+ muscles)- Dry  Needling, and Patient/Family education, ice, heat  PLAN FOR NEXT SESSION: DN as able, spinal mobility and general Rt postural strengthening, MT, practice body positioning when transferring his wife   Delon Pack, Student-PT 10/10/2023, 11:58 AM

## 2023-10-11 ENCOUNTER — Ambulatory Visit: Payer: Self-pay | Admitting: Internal Medicine

## 2023-10-12 ENCOUNTER — Other Ambulatory Visit: Payer: Self-pay

## 2023-10-12 DIAGNOSIS — D649 Anemia, unspecified: Secondary | ICD-10-CM

## 2023-10-13 ENCOUNTER — Ambulatory Visit: Admitting: Rehabilitation

## 2023-10-13 DIAGNOSIS — M79601 Pain in right arm: Secondary | ICD-10-CM | POA: Diagnosis not present

## 2023-10-13 DIAGNOSIS — R293 Abnormal posture: Secondary | ICD-10-CM

## 2023-10-13 LAB — B12 AND FOLATE PANEL
Folate: 15.5 ng/mL
Vitamin B-12: 627 pg/mL (ref 200–1100)

## 2023-10-13 LAB — CBC WITH DIFFERENTIAL/PLATELET
Absolute Lymphocytes: 1612 {cells}/uL (ref 850–3900)
Absolute Monocytes: 510 {cells}/uL (ref 200–950)
Basophils Absolute: 29 {cells}/uL (ref 0–200)
Basophils Relative: 0.6 %
Eosinophils Absolute: 127 {cells}/uL (ref 15–500)
Eosinophils Relative: 2.6 %
HCT: 38.6 % (ref 38.5–50.0)
Hemoglobin: 12.9 g/dL — ABNORMAL LOW (ref 13.2–17.1)
MCH: 31.9 pg (ref 27.0–33.0)
MCHC: 33.4 g/dL (ref 32.0–36.0)
MCV: 95.5 fL (ref 80.0–100.0)
MPV: 12.1 fL (ref 7.5–12.5)
Monocytes Relative: 10.4 %
Neutro Abs: 2622 {cells}/uL (ref 1500–7800)
Neutrophils Relative %: 53.5 %
Platelets: 251 Thousand/uL (ref 140–400)
RBC: 4.04 Million/uL — ABNORMAL LOW (ref 4.20–5.80)
RDW: 11.8 % (ref 11.0–15.0)
Total Lymphocyte: 32.9 %
WBC: 4.9 Thousand/uL (ref 3.8–10.8)

## 2023-10-13 LAB — COMPREHENSIVE METABOLIC PANEL WITH GFR
AG Ratio: 2.3 (calc) (ref 1.0–2.5)
ALT: 21 U/L (ref 9–46)
AST: 26 U/L (ref 10–35)
Albumin: 4.5 g/dL (ref 3.6–5.1)
Alkaline phosphatase (APISO): 48 U/L (ref 35–144)
BUN: 15 mg/dL (ref 7–25)
CO2: 27 mmol/L (ref 20–32)
Calcium: 9.4 mg/dL (ref 8.6–10.3)
Chloride: 107 mmol/L (ref 98–110)
Creat: 0.8 mg/dL (ref 0.70–1.35)
Globulin: 2 g/dL (ref 1.9–3.7)
Glucose, Bld: 101 mg/dL — ABNORMAL HIGH (ref 65–99)
Potassium: 4.7 mmol/L (ref 3.5–5.3)
Sodium: 140 mmol/L (ref 135–146)
Total Bilirubin: 0.4 mg/dL (ref 0.2–1.2)
Total Protein: 6.5 g/dL (ref 6.1–8.1)
eGFR: 96 mL/min/1.73m2 (ref >=60)

## 2023-10-13 LAB — TEST AUTHORIZATION

## 2023-10-13 LAB — LIPID PANEL
Cholesterol: 165 mg/dL (ref <200)
HDL: 68 mg/dL (ref >=40)
LDL Cholesterol (Calc): 84 mg/dL
Non-HDL Cholesterol (Calc): 97 mg/dL (ref <130)
Total CHOL/HDL Ratio: 2.4 (calc) (ref <5.0)
Triglycerides: 52 mg/dL (ref <150)

## 2023-10-13 LAB — IRON,TIBC AND FERRITIN PANEL
%SAT: 28 % (ref 20–48)
Ferritin: 44 ng/mL (ref 24–380)
Iron: 81 ug/dL (ref 50–180)
TIBC: 287 ug/dL (ref 250–425)

## 2023-10-13 LAB — RETICULOCYTES
ABS Retic: 61050 {cells}/uL (ref 25000–90000)
Retic Ct Pct: 1.5 %

## 2023-10-13 LAB — PSA: PSA: 2.02 ng/mL (ref <=4.00)

## 2023-10-13 NOTE — Therapy (Addendum)
 OUTPATIENT PHYSICAL THERAPY UPPER EXTREMITY  Patient Name: James [name] MRN: [id number] DOB:09-10-1955, 68 y.o., male Today's Date: 10/13/2023  END OF SESSION:  PT End of Session - 10/13/23 1210     Visit Number 3    Number of Visits 13    Date for Recertification  11/16/23    Authorization Type none    PT Start Time 1105    PT Stop Time 1158    PT Time Calculation (min) 53 min    Activity Tolerance Patient tolerated treatment well    Behavior During Therapy [facility] for tasks assessed/performed            Past Medical History:  Diagnosis Date   Anemia    Aortic atherosclerosis    Barrett's esophagus    Cancer (HCC)    skin   Colitis    Degenerative disc disease    Diverticulosis 2013   Colonoscopy   GERD (gastroesophageal reflux disease)    Hiatal hernia    Hx of colonic polyps 2013   Colonoscopy- tubular adenoma, and hyperplastic    Hyperlipidemia    Internal hemorrhoids 2013   Colonoscopy   Lower back pain    Right inguinal hernia    Sleep apnea 2015   on CPAP   Tubular adenoma    Past Surgical History:  Procedure Laterality Date   COLONOSCOPY     HERNIA REPAIR  1980   left inguinal   KNEE ARTHROSCOPY  2018   TOTAL HIP ARTHROPLASTY Right 03/14/2014   Procedure: RIGHT TOTAL HIP ARTHROPLASTY ANTERIOR APPROACH ;  Surgeon: Lonni CINDERELLA Poli, MD;  Location: [facility];  Service: Orthopedics;  Laterality: Right;   TOTAL HIP ARTHROPLASTY Left 04/30/2021   Procedure: LEFT TOTAL HIP ARTHROPLASTY ANTERIOR APPROACH;  Surgeon: Poli Lonni CINDERELLA, MD;  Location: [facility];  Service: Orthopedics;  Laterality: Left;   WISDOM TOOTH EXTRACTION     Patient Active Problem List   Diagnosis Date Noted   Right arm pain 10/03/2023   Trigger point 10/03/2023   Centrilobular emphysema (HCC) 02/15/2022   Status post left hip replacement 04/30/2021   Unilateral primary osteoarthritis, left hip 04/29/2021   CSR (central serous retinopathy) 03/28/2019   Status post  arthroscopic knee surgery 08/09/2017   Unilateral primary osteoarthritis, left knee 04/10/2017   Status post arthroscopy of left knee 03/23/2017   Barrett's esophagus 05/30/2016   OSA (obstructive sleep apnea) 12/23/2015   Exertional dyspnea 12/23/2015   Status post total replacement of right hip 03/14/2014   Osteoarthritis of right hip 03/04/2014   Allergic rhinitis 03/04/2014   History of adenomatous polyp of colon 08/19/2011   Left sided colitis (HCC) 08/19/2011   GE reflux 11/14/2010   Low back pain 11/14/2010   History of smoking 11/14/2010   Hyperlipidemia 11/14/2010    PCP: Ronal Hailstone, MD  REFERRING PROVIDER: Raymond de Peru, MD  REFERRING DIAG:  Diagnosis  M79.601 (ICD-10-CM) - Right arm pain  M79.10 (ICD-10-CM) - Trigger point    THERAPY DIAG:  No diagnosis found.  Rationale for Evaluation and Treatment: Rehabilitation  ONSET DATE: 6 months ago   SUBJECTIVE:  SUBJECTIVE STATEMENT: Patient reports he does not have any pain currently but has had some pain since his last visit. The pain in his neck has improved when he is laying down. Compliant with HEP. Patient states he had severe pain when transferring his wife from the bed to her WC yesterday. Patient's wife is currently in a nursing home following a hospitalization and he is her primary caregiver.   EVAL: Ongoing Rt arm and shoulder blade area pain x almost 6 months.  The pain in the arm happens with transferring my wife or lifting something heavy.   I also have pain in the scapula/shoulder blade that feels like a spot I can touch.  I am staying in the rehab facility with my wife currently.  It can hurt even if I just hold my mug in that hand for awhile.   Hand dominance: Right  PERTINENT HISTORY: Caregiver for his wife has trouble  due to a cerebellar issue.  She is currently in rehab.   PAIN:  Are you having pain? Yes: NPRS scale: The arm pain up to 6/10  Pain location: mid bicep belly  Pain description: a sharp pain  Aggravating factors: using the arm and lifting  Relieving factors: bengay   PAIN:  Are you having pain? Yes: NPRS scale: The scapula pain up to 4/10  Pain location: rhomboid Pain description: dull and nagging  Aggravating factors: pressure on that spot only and when sleeping   Relieving factors: just not pushing on it.    PRECAUTIONS: None  RED FLAGS: None   WEIGHT BEARING RESTRICTIONS: No  FALLS:  Has patient fallen in last 6 months? 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  DIAGNOSTIC FINDINGS:  Rt shoulder MRI in 2019 showing Rt UT prominence but no explanation for palpable abnormality. (Pt reports his wife noticed his shoulder blade looked like it was popping out but it was not painful at that time)   PATIENT SURVEYS :  Quick Dash:  QUICK DASH  Please rate your ability do the following activities in the last week by selecting the number below the appropriate response.   Activities Rating  Open a tight or new jar.  1 = No difficulty   Do heavy household chores (e.g., wash walls, floors). 1 = No difficulty   Carry a shopping bag or briefcase 1 = No difficulty   Wash your back. 1 = No difficulty   Use a knife to cut food. 1 = No difficulty   Recreational activities in which you take some force or impact through your arm, shoulder or hand (e.g., golf, hammering, tennis, etc.). 2 = Mild difficulty  During the past week, to what extent has  your arm, shoulder or hand problem interfered with your normal social activities with family, friends, neighbors or groups?  1 = Not at all  During the past week, were you limited in your work or other regular daily activities as a result of your arm, shoulder or hand problem? 1 = Not limited at all  Rate the severity of the following symptoms in the last week: Arm, Shoulder, or hand pain. 4 = Severe  Rate  the severity of the following symptoms in the last week: Tingling (pins and needles) in your arm, shoulder or hand. 1 = none  During the past week, how much difficulty have you had sleeping because of the pain in your arm, shoulder or hand?  2 = Mild difficulty   (A QuickDASH score may not be calculated if there is greater than 1 missing item.)  Quick Dash Disability/Symptom Score: [(sum of 16 (n) responses/11 (n)] x 25 = 36  Minimally Clinically Important Difference (MCID): 15-20 points  (Franchignoni, F. et al. (2013). Minimally clinically important difference of the disabilities of the arm, shoulder, and hand outcome measures (DASH) and its shortened version (Quick DASH). Journal of Orthopaedic & Sports Physical Therapy, 44(1), 30-39)   COGNITION: Overall cognitive status: Within functional limits for tasks assessed     SENSATION: WFL  POSTURE: In standing: stands with arms behind torso, appearance of possible scoliosis but pt is not aware of this, Rt scapula more protracted and upwardly rotated compared to the Lt.    UPPER EXTREMITY ROM:  All ROM WNL without pain: scapular dyskinesia noted with active flexion and abduction   UPPER EXTREMITY MMT:  MMT Right eval Left eval  Shoulder flexion 5 5  Shoulder extension 5 5  Shoulder abduction 5 pn at rhomboid 5  Shoulder adduction    Shoulder internal rotation 5 5  Shoulder external rotation 5 5  Middle trapezius 4+ 4+  Lower trapezius 4 4  Elbow flexion 5 5  Elbow extension 5 5  Wrist flexion    Wrist extension    Wrist  ulnar deviation    Wrist radial deviation    Wrist pronation    Wrist supination    Grip strength (lbs)    (Blank rows = not tested)  SHOULDER SPECIAL TESTS: All noted negative at MD visit yesterday.   JOINT MOBILITY TESTING:  Rt shoulder more elevated in supine   PALPATION:  No ttp in the Rt arm, +1 ttp to the Rt rhomboid and under the medial border of the mid scapula  Pain with CPA of T4, T3, T2, C7 Pain in the neck prone with rotation to the Left but not Rt                                                                                                                             TREATMENT DATE:  10/13/23 Manual therapy:  Scapular mobilizations - upward rotation, downward rotation, retraction  STM to rhomboids and scapular borders Thoracic mobs - CPA & UPA Grade III-IV T2 - T8 Therapeutic Exercise:  UBE 2'/2' each direction  R LS stretch 3 x 20  Seated thoracic extension with half foam roller 1 x 5 with 10 hold with vcs to decrease excessive neck extension due to pain here Scapular retractions 2 x 10 Standing rows RTB x 15 Education on DL squats and correct form and added to HEP x 10 Therapeutic Activity:  Educated  pt on various techniques, including stand pivot transfers and slide board transfers for transferring his wife to/from the bed to the [facility] and had him practice multiple times Educated pt on purpose of a slideboard and stander transfer device  10/10/23 Manual therapy:  Scapular mobilizations - upward rotation, downward rotation, retraction  STM to rhomboids and scapular borders Thoracic mobs - CPA & UPA Grade III-IV T2 - T8 Therapeutic Exercise:  UBE 2'/2' each direction  R LS stretch 3 x 20  R Rhomboid stretch 3 x 20 Seated thoracic extension with half foam roller 2 x 10 with 3 hold with vcs to bend with thoracic spine and not compensating with cervical spine.   Scapular retractions 2 x 10  10/05/23 Eval performed  Discussed POC and DN fees    PATIENT  EDUCATION: Education details: per today's note Person educated: Patient Education method: Explanation Education comprehension: verbalized understanding  HOME EXERCISE PROGRAM: Access Code: [email or web address] URL: [email or web address] Date: 10/10/2023 Prepared by: Saddie Raw  Exercises - Seated Scapular Retraction  - 2-3 x daily - 7 x weekly - 1 sets - 10 reps - 2-3 hold - Seated Thoracic Lumbar Extension with Pectoralis Stretch  - 2-3 x daily - 7 x weekly - 1 sets - 5-10 reps - 10 sec hold - Standing Shoulder Posterior Capsule Stretch  - 2-3 x daily - 7 x weekly - 1 sets - 1-3 reps - 20-30 seconds hold - Seated Levator Scapulae Stretch  - 2-3 x daily - 7 x weekly - 1 sets - 1-3 reps - 20-30 seconds hold  ASSESSMENT:  CLINICAL IMPRESSION: Patient tolerates manual therapy with reduced pain and ease of motion following. Verbal cueing and demonstrates from PT are required for proper technique to extend thoracic spine rather than only extending the cervical spine. The patient's is the primary caregiver for his wife who is currently in a nursing facility. The patient reports pain in the arm when completing transfers to/from the bed to the [facility]. Patient education is provided for various techniques of transfers, including squat-pivot and utilizing a squat board. When demonstrating how he completes transfers, the patient primarily uses his upper body with a narrow BOS and leans his body backwards. Patient is educated to try using his LLE to block his wife's knee from buckling, being in a split stance squat position with a wider BOS. Patient attempts multiple transfers like this using a therapist to simulate the transfers; he will try these techniques over the weekend and report back on how it goes at his next visit on Monday. HEP is updated to include global LE strengthening to ease transfers. The patient will benefit from continued skilled physical therapy to address the impairments listed below.    OBJECTIVE IMPAIRMENTS: decreased activity tolerance, decreased knowledge of condition, impaired flexibility, impaired UE functional use, and postural dysfunction.   ACTIVITY LIMITATIONS: lifting  PARTICIPATION LIMITATIONS: none  PERSONAL FACTORS: Time since onset of injury/illness/exacerbation are also affecting patient's functional outcome.   REHAB POTENTIAL: Good  CLINICAL DECISION MAKING: Stable/uncomplicated  EVALUATION COMPLEXITY: Low  GOALS: Goals reviewed with patient? Yes  SHORT TERM GOALS: Target date: 10/18/23  Pt will be educated on proper lifting and transferring posture to minimize arm pain  Baseline: Goal status: INITIAL   LONG TERM GOALS: Target date: 11/16/23  Pt will have no increased rhomboid pain when sitting in a chair  Baseline:  Goal status: INITIAL  2.  Pt will have no pain in the Rt upper arm when moving his  wife around in bed  Baseline:  Goal status: INITIAL  3.  Pt will be ind with final HEP for spinal mobility and scapular strengthening  Baseline:  Goal status: INITIAL   PLAN: PT FREQUENCY: 2x/week  PT DURATION: 6 weeks  PLANNED INTERVENTIONS: 97164- PT Re-evaluation, 97110-Therapeutic exercises, 97530- Therapeutic activity, 97112- Neuromuscular re-education, 97535- Self Care, [id number]- Manual therapy, [id number]- Electrical stimulation (unattended), 97035- Ultrasound, 02987- Traction (mechanical), 20560 (1-2 muscles), 20561 (3+ muscles)- Dry Needling, and Patient/Family education, ice, heat  PLAN FOR NEXT SESSION: DN as able to scapular mm and neck, spinal mobility and general Rt postural strengthening, MT, practice body positioning when transferring his wife   Randall Pack, SPT 10/13/23, 12:02 pm   Saddie Raw, PT 10/13/23, 12:14 PM   I agree with the following treatment note after reviewing documentation. This session was performed under the supervision of a licensed clinician.

## 2023-10-16 ENCOUNTER — Ambulatory Visit: Attending: Internal Medicine

## 2023-10-16 ENCOUNTER — Encounter: Payer: Self-pay | Admitting: Internal Medicine

## 2023-10-16 ENCOUNTER — Ambulatory Visit (INDEPENDENT_AMBULATORY_CARE_PROVIDER_SITE_OTHER): Admitting: Internal Medicine

## 2023-10-16 ENCOUNTER — Ambulatory Visit

## 2023-10-16 VITALS — BP 130/70 | HR 66 | Ht 71.0 in | Wt 184.0 lb

## 2023-10-16 DIAGNOSIS — H35711 Central serous chorioretinopathy, right eye: Secondary | ICD-10-CM

## 2023-10-16 DIAGNOSIS — R002 Palpitations: Secondary | ICD-10-CM | POA: Diagnosis not present

## 2023-10-16 DIAGNOSIS — K2100 Gastro-esophageal reflux disease with esophagitis, without bleeding: Secondary | ICD-10-CM

## 2023-10-16 DIAGNOSIS — Z96641 Presence of right artificial hip joint: Secondary | ICD-10-CM

## 2023-10-16 DIAGNOSIS — M79601 Pain in right arm: Secondary | ICD-10-CM | POA: Diagnosis not present

## 2023-10-16 DIAGNOSIS — G4733 Obstructive sleep apnea (adult) (pediatric): Secondary | ICD-10-CM

## 2023-10-16 DIAGNOSIS — F411 Generalized anxiety disorder: Secondary | ICD-10-CM | POA: Diagnosis not present

## 2023-10-16 DIAGNOSIS — I4510 Unspecified right bundle-branch block: Secondary | ICD-10-CM | POA: Diagnosis not present

## 2023-10-16 DIAGNOSIS — E7800 Pure hypercholesterolemia, unspecified: Secondary | ICD-10-CM

## 2023-10-16 DIAGNOSIS — Z0000 Encounter for general adult medical examination without abnormal findings: Secondary | ICD-10-CM

## 2023-10-16 DIAGNOSIS — Z23 Encounter for immunization: Secondary | ICD-10-CM | POA: Diagnosis not present

## 2023-10-16 DIAGNOSIS — R293 Abnormal posture: Secondary | ICD-10-CM

## 2023-10-16 DIAGNOSIS — J302 Other seasonal allergic rhinitis: Secondary | ICD-10-CM

## 2023-10-16 DIAGNOSIS — K51318 Ulcerative (chronic) rectosigmoiditis with other complication: Secondary | ICD-10-CM

## 2023-10-16 NOTE — Therapy (Signed)
 OUTPATIENT PHYSICAL THERAPY UPPER EXTREMITY  Patient Name: James [name] MRN: [id number] DOB:04/14/1955, 68 y.o., male Today's Date: 10/16/2023  END OF SESSION:  PT End of Session - 10/16/23 1326     Visit Number 4    Number of Visits 13    Date for Recertification  11/16/23    Authorization Type none    PT Start Time 1235    PT Stop Time 1315    PT Time Calculation (min) 40 min    Activity Tolerance Patient tolerated treatment well    Behavior During Therapy [facility] for tasks assessed/performed             Past Medical History:  Diagnosis Date   Anemia    Aortic atherosclerosis    Barrett's esophagus    Cancer (HCC)    skin   Colitis    Degenerative disc disease    Diverticulosis 2013   Colonoscopy   GERD (gastroesophageal reflux disease)    Hiatal hernia    Hx of colonic polyps 2013   Colonoscopy- tubular adenoma, and hyperplastic    Hyperlipidemia    Internal hemorrhoids 2013   Colonoscopy   Lower back pain    Right inguinal hernia    Sleep apnea 2015   on CPAP   Tubular adenoma    Past Surgical History:  Procedure Laterality Date   COLONOSCOPY     HERNIA REPAIR  1980   left inguinal   KNEE ARTHROSCOPY  2018   TOTAL HIP ARTHROPLASTY Right 03/14/2014   Procedure: RIGHT TOTAL HIP ARTHROPLASTY ANTERIOR APPROACH ;  Surgeon: Lonni CINDERELLA Poli, MD;  Location: [facility];  Service: Orthopedics;  Laterality: Right;   TOTAL HIP ARTHROPLASTY Left 04/30/2021   Procedure: LEFT TOTAL HIP ARTHROPLASTY ANTERIOR APPROACH;  Surgeon: Poli Lonni CINDERELLA, MD;  Location: [facility];  Service: Orthopedics;  Laterality: Left;   WISDOM TOOTH EXTRACTION     Patient Active Problem List   Diagnosis Date Noted   Right arm pain 10/03/2023   Trigger point 10/03/2023   Centrilobular emphysema (HCC) 02/15/2022   Status post left hip replacement 04/30/2021   Unilateral primary osteoarthritis, left hip 04/29/2021   CSR (central serous retinopathy) 03/28/2019   Status post  arthroscopic knee surgery 08/09/2017   Unilateral primary osteoarthritis, left knee 04/10/2017   Status post arthroscopy of left knee 03/23/2017   Barrett's esophagus 05/30/2016   OSA (obstructive sleep apnea) 12/23/2015   Exertional dyspnea 12/23/2015   Status post total replacement of right hip 03/14/2014   Osteoarthritis of right hip 03/04/2014   Allergic rhinitis 03/04/2014   History of adenomatous polyp of colon 08/19/2011   Left sided colitis (HCC) 08/19/2011   GE reflux 11/14/2010   Low back pain 11/14/2010   History of smoking 11/14/2010   Hyperlipidemia 11/14/2010    PCP: Ronal Hailstone, MD  REFERRING PROVIDER: Raymond de Peru, MD  REFERRING DIAG:  Diagnosis  M79.601 (ICD-10-CM) - Right arm pain  M79.10 (ICD-10-CM) - Trigger point    THERAPY DIAG:  Right arm pain  Abnormal posture  Rationale for Evaluation and Treatment: Rehabilitation  ONSET DATE: 6 months ago   SUBJECTIVE:  SUBJECTIVE STATEMENT: Patient reports he had a slight increase in soreness after his last visit but it was tolerable. He did not experience any pain when completing the transfers over the weekend but was unable to attempt the techniques discussed during the last visit due to the setup of the room.   EVAL: Ongoing Rt arm and shoulder blade area pain x almost 6 months.  The pain in the arm happens with transferring my wife or lifting something heavy.   I also have pain in the scapula/shoulder blade that feels like a spot I can touch.  I am staying in the rehab facility with my wife currently.  It can hurt even if I just hold my mug in that hand for awhile.   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  She is currently in rehab.   PAIN:  Are you having  pain? Yes: NPRS scale: The arm pain up to 6/10  Pain location: mid bicep belly  Pain description: a sharp pain  Aggravating factors: using the arm and lifting  Relieving factors: bengay   PAIN:  Are you having pain? Yes: NPRS scale: The scapula pain up to 4/10  Pain location: rhomboid Pain description: dull and nagging  Aggravating factors: pressure on that spot only and when sleeping   Relieving factors: just not pushing on it.    PRECAUTIONS: None  RED FLAGS: None   WEIGHT BEARING RESTRICTIONS: No  FALLS:  Has patient fallen in last 6 months? 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  DIAGNOSTIC FINDINGS:  Rt shoulder MRI in 2019 showing Rt UT prominence but no explanation for palpable abnormality. (Pt reports his wife noticed his shoulder blade looked like it was popping out but it was not painful at that time)   PATIENT SURVEYS :  Quick Dash:  QUICK DASH  Please rate your ability do the following activities in the last week by selecting the number below the appropriate response.   Activities Rating  Open a tight or new jar.  1 = No difficulty   Do heavy household chores (e.g., wash walls, floors). 1 = No difficulty   Carry a shopping bag or briefcase 1 = No difficulty   Wash your back. 1 = No difficulty   Use a knife to cut food. 1 = No difficulty   Recreational activities in which you take some force or impact through your arm, shoulder or hand (e.g., golf, hammering, tennis, etc.). 2 = Mild difficulty  During the past week, to what extent has your arm, shoulder or hand problem interfered with your normal social activities  with family, friends, neighbors or groups?  1 = Not at all  During the past week, were you limited in your work or other regular daily activities as a result of your arm, shoulder or hand problem? 1 = Not limited at all  Rate the severity of the following symptoms in the last week: Arm, Shoulder, or hand pain. 4 = Severe  Rate the severity of the following symptoms in the last week: Tingling (pins and needles) in your arm, shoulder  or hand. 1 = none  During the past week, how much difficulty have you had sleeping because of the pain in your arm, shoulder or hand?  2 = Mild difficulty   (A QuickDASH score may not be calculated if there is greater than 1 missing item.)  Quick Dash Disability/Symptom Score: [(sum of 16 (n) responses/11 (n)] x 25 = 36  Minimally Clinically Important Difference (MCID): 15-20 points  (Franchignoni, F. et al. (2013). Minimally clinically important difference of the disabilities of the arm, shoulder, and hand outcome measures (DASH) and its shortened version (Quick DASH). Journal of Orthopaedic & Sports Physical Therapy, 44(1), 30-39)   COGNITION: Overall cognitive status: Within functional limits for tasks assessed     SENSATION: WFL  POSTURE: In standing: stands with arms behind torso, appearance of possible scoliosis but pt is not aware of this, Rt scapula more protracted and upwardly rotated compared to the Lt.    UPPER EXTREMITY ROM:  All ROM WNL without pain: scapular dyskinesia noted with active flexion and abduction   UPPER EXTREMITY MMT:  MMT Right eval Left eval  Shoulder flexion 5 5  Shoulder extension 5 5  Shoulder abduction 5 pn at rhomboid 5  Shoulder adduction    Shoulder internal rotation 5 5  Shoulder external rotation 5 5  Middle trapezius 4+ 4+  Lower trapezius 4 4  Elbow flexion 5 5  Elbow extension 5 5  Wrist flexion    Wrist extension    Wrist ulnar deviation    Wrist radial deviation    Wrist pronation    Wrist supination     Grip strength (lbs)    (Blank rows = not tested)  SHOULDER SPECIAL TESTS: All noted negative at MD visit yesterday.   JOINT MOBILITY TESTING:  Rt shoulder more elevated in supine   PALPATION:  No ttp in the Rt arm, +1 ttp to the Rt rhomboid and under the medial border of the mid scapula  Pain with CPA of T4, T3, T2, C7 Pain in the neck prone with rotation to the Left but not Rt                                                                                                                             TREATMENT DATE:  10/16/23  Manual therapy:  Trigger Point Dry Needling  Initial Treatment: Pt instructed on Dry Needling rational, procedures, and possible side effects. Pt instructed to expect mild to moderate muscle soreness later in the day and/or into the next day.  Pt instructed in methods to reduce muscle soreness. Pt instructed to continue prescribed HEP. Because Dry Needling was performed over or adjacent to a lung field, pt was educated on S/S of pneumothorax and to seek immediate medical attention should they occur.  Patient was educated on signs and symptoms of infection and other risk factors and advised to seek medical attention should they occur.  Patient verbalized understanding of these instructions  and education.   Patient Verbal Consent Given: Yes Education Handout Provided: Yes Muscles Treated: Rt thoracic multifidi, Rt rhomboids, Rt subscapularis  Electrical Stimulation Performed: No Treatment Response/Outcome: Utilized skilled palpation to identify bony landmarks and trigger points.  Able to illicit twitch response and muscle elongation.      Scapular mobilizations - upward rotation, downward rotation, retraction  STM to rhomboids and scapular borders Thoracic mobs - CPA & UPA Grade III-IV T2 - T8 Therapeutic Exercise:  UBE 2'/2' each direction  Scapular retractions 2 x 10 Standing rows RTB x 15 - VC for proper form and to keep shoulders from hiking up when  pulling the band back  Pallof pres RTB x 10 bil - VC to keep shoulders from coming up throughout D2 extension RTB x 10 bil - VC to only move through available motion while keeping proper form   10/13/23 Manual therapy:  Scapular mobilizations - upward rotation, downward rotation, retraction  STM to rhomboids and scapular borders Thoracic mobs - CPA & UPA Grade III-IV T2 - T8 Therapeutic Exercise:  UBE 2'/2' each direction  R LS stretch 3 x 20  Seated thoracic extension with half foam roller 1 x 5 with 10 hold with vcs to decrease excessive neck extension due to pain here Scapular retractions 2 x 10 Standing rows RTB x 15 Education on DL squats and correct form and added to HEP x 10 Therapeutic Activity:  Educated pt on various techniques, including stand pivot transfers and slide board transfers for transferring his wife to/from the bed to the [facility] and had him practice multiple times Educated pt on purpose of a slideboard and stander transfer device  10/10/23 Manual therapy:  Scapular mobilizations - upward rotation, downward rotation, retraction  STM to rhomboids and scapular borders Thoracic mobs - CPA & UPA Grade III-IV T2 - T8 Therapeutic Exercise:  UBE 2'/2' each direction  R LS stretch 3 x 20  R Rhomboid stretch 3 x 20 Seated thoracic extension with half foam roller 2 x 10 with 3 hold with vcs to bend with thoracic spine and not compensating with cervical spine.   Scapular retractions 2 x 10    PATIENT EDUCATION: Education details: per today's note Person educated: Patient Education method: Explanation Education comprehension: verbalized understanding  HOME EXERCISE PROGRAM: Access Code: [email or web address] URL: [email or web address] Date: 10/10/2023 Prepared by: Saddie Raw  Exercises - Seated Scapular Retraction  - 2-3 x daily - 7 x weekly - 1 sets - 10 reps - 2-3 hold - Seated Thoracic Lumbar Extension with Pectoralis Stretch  - 2-3 x daily - 7 x weekly -  1 sets - 5-10 reps - 10 sec hold - Standing Shoulder Posterior Capsule Stretch  - 2-3 x daily - 7 x weekly - 1 sets - 1-3 reps - 20-30 seconds hold - Seated Levator Scapulae Stretch  - 2-3 x daily - 7 x weekly - 1 sets - 1-3 reps - 20-30 seconds hold  ASSESSMENT:  CLINICAL IMPRESSION: Patient tolerates manual therapy with reduced pain and ease of motion following. Good response to dry needling with improved tissue mobility and twitch response. Verbal cueing is required throughout all exercises due to UT compensation. Patient tolerates the addition of pallof press and UE D2 extension with no increases in pain or discomfort noted. The patient reports increased soreness in the R scapulae following the session. Patient education regarding dry needling, the risks and what to expect following is provided; verbal and written consent is obtained prior to  beginning dry needling. The patient will benefit from continued skilled physical therapy to address the impairments listed below.   OBJECTIVE IMPAIRMENTS: decreased activity tolerance, decreased knowledge of condition, impaired flexibility, impaired UE functional use, and postural dysfunction.   ACTIVITY LIMITATIONS: lifting  PARTICIPATION LIMITATIONS: none  PERSONAL FACTORS: Time since onset of injury/illness/exacerbation are also affecting patient's functional outcome.   REHAB POTENTIAL: Good  CLINICAL DECISION MAKING: Stable/uncomplicated  EVALUATION COMPLEXITY: Low  GOALS: Goals reviewed with patient? Yes  SHORT TERM GOALS: Target date: 10/18/23  Pt will be educated on proper lifting and transferring posture to minimize arm pain  Baseline: Goal status: INITIAL   LONG TERM GOALS: Target date: 11/16/23  Pt will have no increased rhomboid pain when sitting in a chair  Baseline:  Goal status: INITIAL  2.  Pt will have no pain in the Rt upper arm when moving his wife around in bed  Baseline:  Goal status: INITIAL  3.  Pt will be ind  with final HEP for spinal mobility and scapular strengthening  Baseline:  Goal status: INITIAL   PLAN: PT FREQUENCY: 2x/week  PT DURATION: 6 weeks  PLANNED INTERVENTIONS: [id number]- PT Re-evaluation, 97110-Therapeutic exercises, 97530- Therapeutic activity, [id number]- Neuromuscular re-education, 97535- Self Care, [id number]- Manual therapy, [id number]- Electrical stimulation (unattended), 97035- Ultrasound, [id number]- Traction (mechanical), 20560 (1-2 muscles), 20561 (3+ muscles)- Dry Needling, and Patient/Family education, ice, heat  PLAN FOR NEXT SESSION: assess response to dry needling , spinal mobility and general Rt postural strengthening, MT, practice body positioning when transferring his wife, supine I's, T's and Y's to strengthen trapezius muscle   Randall Pack, SPT 10/16/23, 1:25 pm   I agree with the following treatment note after reviewing documentation. This session was performed under the supervision of a licensed clinician. Burnard Joy, PT 10/16/23 1:35 PM

## 2023-10-16 NOTE — Progress Notes (Signed)
 Annual Wellness Visit   Patient Care Team: Perri Ronal PARAS, MD as PCP - General (Internal Medicine)  Visit Date: 10/16/23   Chief Complaint  Patient presents with   Annual Exam   Subjective:  Patient: James [name], Male DOB: November 29, 1955, 68 y.o. MRN: [id number] Vitals:   10/16/23 1508  BP: 130/70   James [name] is a 68 y.o. Male who presents today for his Annual Wellness Visit. Patient has GE reflux; Low back pain; History of smoking; Hyperlipidemia; History of adenomatous polyp of colon; Left sided colitis (HCC); Osteoarthritis of right hip; Allergic rhinitis; Status post total replacement of right hip; OSA (obstructive sleep apnea); Exertional dyspnea; Barrett's esophagus; Status post arthroscopy of left knee; Unilateral primary osteoarthritis, left knee; Status post arthroscopic knee surgery; CSR (central serous retinopathy); Unilateral primary osteoarthritis, left hip; Status post left hip replacement; Centrilobular emphysema (HCC); Right arm pain; and Trigger point on their problem list.    Physical exam revealed occasional irregular contraction. He says he can feel it when his heart skips a beat.  EKG was done inoffice today  History of Anxiety treated with Alprazolam  0.25 mg twice daily as needed    History of Hyperlipidemia treated with Rosuvastatin  5 mg daily. 10/10/2023 Lipid Panel normal.   History of GE Reflux treated with Pantoprazole  40 mg daily.     History of snoring and sleep apnea.  He has CPAP device. He has a history of BPH.   History of osteoarthritis of right hip and is status post right hip arthroplasty by Dr. Vernetta in February 2016.  He had left medial meniscectomy in 2019.  He cannot have steroid injections due to optical issues with his eyes.  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   History of fractured left forearm 1962, fractured right  ankle 1971, left inguinal herniorrhaphy 1995, history of internal hemorrhoids.  History of low back pain.  History of meralgia paresthetica right leg.  He saw Dr. Leeann many years ago and was told he had degenerative disc disease of the spine.   No known drug allergies.   He has a history of inflammatory bowel disease (left-sided ulcerative colitis) diagnosed in 2018 and [facility]. Last colonoscopy was 2023. Dr. Albertus is Gastroenterologist.  He has Canasa  suppository and Lialda  tablet for inflammatory bowel disease. History of sessile serrated adenomatous colon polyp.  B-12 and folate normal on Sept 23 were normal.   03/15/2021 Colonoscopy The examined portion of the ileum was normal. Colitis, indeterminate. Active inflammation in distal rectum ( proctitis). Inactive inflammation with pseudopolyps in the proximal sigmoid, descending colon and in the distal transverse colon. This was graded as Mayo Score 2 ( moderate disease). Biopsied. One 4 mm polyp in the cecum, removed with a cold snare. Resected and retrieved. One 6 mm polyp in the rectum, removed with a cold snare. Resected and retrieved. Diverticulosis in the sigmoid colon, in the descending colon and in the ascending colon. Small internal hemorrhoids. Both polyps removed were benign but one was precancerous.    Vaccine counseling: Influenza vaccine received today, Pneumonia, Shingles second dose, and Covid-19 vaccines due.  Health Maintenance  Topic Date Due   Pneumococcal Vaccine: 50+ Years (2 of 2 - PPSV23, PCV20, or PCV21) 10/14/2019   Zoster Vaccines- Shingrix (2 of 2) 03/25/2021   Influenza Vaccine  08/18/2023   COVID-19  Vaccine (7 - Moderna risk 2024-25 season) 09/18/2023   Colonoscopy  03/15/2024   Medicare Annual Wellness (AWV)  10/09/2024   DTaP/Tdap/Td (4 - Td or Tdap) 01/24/2029   Hepatitis C Screening  Completed   HPV VACCINES  Aged Out   Meningococcal B Vaccine  Aged Out   Hepatitis B  Vaccines 19-59 Average Risk  Discontinued     Review of Systems  Constitutional:  Negative for fever and malaise/fatigue.  HENT:  Negative for congestion.   Eyes:  Negative for blurred vision.  Respiratory:  Negative for cough and shortness of breath.   Cardiovascular:  Negative for chest pain, palpitations and leg swelling.  Gastrointestinal:  Negative for vomiting.  Musculoskeletal:  Negative for back pain.  Skin:  Negative for rash.  Neurological:  Negative for loss of consciousness and headaches.   Objective:  Vitals: body mass index is 25.66 kg/m. Today's Vitals   10/16/23 1508  BP: 130/70  Pulse: 66  Weight: 184 lb (83.5 kg)  Height: 5' 11 (1.803 m)   Physical Exam Vitals and nursing note reviewed. Exam conducted with a chaperone present (Araceli Osborn, CMA).  Constitutional:      General: He is awake. He is not in acute distress.    Appearance: Normal appearance. He is not ill-appearing or toxic-appearing.  HENT:     Head: Normocephalic and atraumatic.     Right Ear: Tympanic membrane, ear canal and external ear normal.     Left Ear: Tympanic membrane, ear canal and external ear normal.     Mouth/Throat:     Pharynx: Oropharynx is clear.  Eyes:     Extraocular Movements: Extraocular movements intact.     Pupils: Pupils are equal, round, and reactive to light.  Neck:     Thyroid: No thyroid mass, thyromegaly or thyroid tenderness.     Vascular: No carotid bruit.  Cardiovascular:     Rate and Rhythm: Normal rate and regular rhythm. Occasional Extrasystoles are present.    Pulses:          Dorsalis pedis pulses are 2+ on the right side and 2+ on the left side.       Posterior tibial pulses are 2+ on the right side and 2+ on the left side.     Heart sounds: Normal heart sounds. No murmur heard.    No friction rub. No gallop.     Comments: Occasional irregular contraction.  Pulmonary:     Effort: Pulmonary effort is normal.     Breath sounds: Normal breath  sounds. No decreased breath sounds, wheezing, rhonchi or rales.  Chest:     Chest wall: No mass.  Abdominal:     Palpations: Abdomen is soft. There is no hepatomegaly, splenomegaly or mass.     Tenderness: There is no abdominal tenderness.     Hernia: No hernia is present.  Genitourinary:    Prostate: Normal. Not enlarged, not tender and no nodules present.     Rectum: Normal. Guaiac result negative.  Musculoskeletal:     Cervical back: Normal range of motion.     Right lower leg: No edema.     Left lower leg: No edema.  Lymphadenopathy:     Cervical: No cervical adenopathy.     Upper Body:     Right upper body: No supraclavicular adenopathy.     Left upper body: No supraclavicular adenopathy.  Skin:    General: Skin is warm and dry.  Neurological:     General:  No focal deficit present.     Mental Status: He is alert and oriented to person, place, and time. Mental status is at baseline.     Cranial Nerves: Cranial nerves 2-12 are intact.     Sensory: Sensation is intact.     Motor: Motor function is intact.     Coordination: Coordination is intact.     Gait: Gait is intact.     Deep Tendon Reflexes: Reflexes are normal and symmetric.  Psychiatric:        Attention and Perception: Attention normal.        Mood and Affect: Mood normal.        Speech: Speech normal.        Behavior: Behavior normal. Behavior is cooperative.        Thought Content: Thought content normal.        Cognition and Memory: Cognition and memory normal.        Judgment: Judgment normal.     Current Outpatient Medications  Medication Instructions   ALPRAZolam  (XANAX ) 0.25 MG tablet TAKE 1 TABLET(0.25 MG) BY MOUTH TWICE DAILY AS NEEDED FOR ANXIETY   cholecalciferol  (VITAMIN D3) 1,000 Units, Daily   ibuprofen  (ADVIL ) 800 MG tablet TAKE 1 TABLET(800 MG) BY MOUTH EVERY 8 HOURS AS NEEDED   mesalamine  (CANASA ) 1000 MG suppository UNWRAP AND INSERT 1 SUPPOSITORY(1000 MG) RECTALLY AT BEDTIME   mesalamine   (LIALDA ) 1.2 g EC tablet TAKE 4 TABLETS(4.8 GRAMS) BY MOUTH DAILY WITH BREAKFAST   Multiple Vitamin (MULTIVITAMIN) tablet 1 tablet, Daily   oxymetazoline (AFRIN) 0.05 % nasal spray 4 sprays, Daily at bedtime   pantoprazole  (PROTONIX ) 40 mg, Oral, Daily   rosuvastatin  (CRESTOR ) 5 MG tablet TAKE 1 TABLET(5 MG) BY MOUTH DAILY   Past Medical History:  Diagnosis Date   Anemia    Aortic atherosclerosis    Barrett's esophagus    Cancer (HCC)    skin   Colitis    Degenerative disc disease    Diverticulosis 2013   Colonoscopy   GERD (gastroesophageal reflux disease)    Hiatal hernia    Hx of colonic polyps 2013   Colonoscopy- tubular adenoma, and hyperplastic    Hyperlipidemia    Internal hemorrhoids 2013   Colonoscopy   Lower back pain    Right inguinal hernia    Sleep apnea 2015   on CPAP   Tubular adenoma    Medical/Surgical History Narrative:  Allergic/Intolerant to:  Allergies  Allergen Reactions   Prednisone     R/T pooling of blood behind the retina.     Past Surgical History:  Procedure Laterality Date   COLONOSCOPY     HERNIA REPAIR  1980   left inguinal   KNEE ARTHROSCOPY  2018   TOTAL HIP ARTHROPLASTY Right 03/14/2014   Procedure: RIGHT TOTAL HIP ARTHROPLASTY ANTERIOR APPROACH ;  Surgeon: Lonni CINDERELLA Poli, MD;  Location: [facility];  Service: Orthopedics;  Laterality: Right;   TOTAL HIP ARTHROPLASTY Left 04/30/2021   Procedure: LEFT TOTAL HIP ARTHROPLASTY ANTERIOR APPROACH;  Surgeon: Poli Lonni CINDERELLA, MD;  Location: [facility];  Service: Orthopedics;  Laterality: Left;   WISDOM TOOTH EXTRACTION     Family History  Problem Relation Age of Onset   Colon cancer Mother        late 12s   Heart disease Father    Colon cancer Father 65   Esophageal cancer Father        late 35's   Stomach cancer Neg Hx  AAA (abdominal aortic aneurysm) Neg Hx    Rectal cancer Neg Hx    Colon polyps Neg Hx    Family History Narrative:  Father died at age 70 with history of  coronary artery disease, esophageal and colon cancer.  Mother with history of stroke and colon cancer.  Mother is deceased.  2 sisters in good health.  Social History Narrative  Married. Completed 2 years of college. 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 They live near [place].   Most Recent Health Risks Assessment:   Most Recent Social Determinants of Health (Including Hx of Tobacco, Alcohol, and Drug Use) SDOH Screenings   Food Insecurity: No Food Insecurity (10/02/2023)  Housing: Low Risk  (10/02/2023)  Transportation Needs: No Transportation Needs (10/02/2023)  Alcohol Screen: Low Risk  (10/02/2023)  Depression (PHQ2-9): Low Risk  (10/16/2023)  Financial Resource Strain: Low Risk  (10/02/2023)  Physical Activity: Unknown (10/02/2023)  Social Connections: Moderately Integrated (10/02/2023)  Stress: No Stress Concern Present (10/02/2023)  Tobacco Use: Medium Risk (10/16/2023)   Social History   Tobacco Use   Smoking status: Former    Current packs/day: 0.00    Average packs/day: 0.3 packs/day for 30.0 years (7.5 ttl pk-yrs)    Types: Cigarettes    Start date: 03/09/1984    Quit date: 03/09/2014    Years since quitting: 9.6   Smokeless tobacco: Never   Tobacco comments:    Counseling sheet given 08-2011  Vaping Use   Vaping status: Never Used  Substance Use Topics   Alcohol use: Not Currently    Alcohol/week: 10.0 standard drinks of alcohol    Types: 10 Glasses of wine per week    Comment: week   Drug use: No    Most Recent Fall Risk Assessment:    10/03/2023    4:05 PM  Fall Risk   Falls in the past year? 0  Number falls in past yr: 0  Injury with Fall? 0  Risk for fall due to : No Fall Risks  Follow up Falls evaluation completed   Most Recent Anxiety/Depression Screenings:    10/16/2023    3:13 PM 10/03/2023    4:05 PM  PHQ 2/9 Scores  PHQ - 2 Score 0 0  PHQ- 9 Score 0 0      10/03/2023    4:05 PM  GAD 7 : Generalized Anxiety Score   Nervous, Anxious, on Edge 0  Control/stop worrying 0  Worry too much - different things 0  Trouble relaxing 0  Restless 0  Easily annoyed or irritable 0  Afraid - awful might happen 0  Total GAD 7 Score 0  Anxiety Difficulty Not difficult at all   Results:  Studies Obtained And Personally Reviewed By Me:   03/15/2021 Colonoscopy The examined portion of the ileum was normal. Colitis, indeterminate. Active inflammation in distal rectum ( proctitis). Inactive inflammation with pseudopolyps in the proximal sigmoid, descending colon and in the distal transverse colon. This was graded as Mayo Score 2 ( moderate disease). Biopsied. One 4 mm polyp in the cecum, removed with a cold snare. Resected and retrieved. One 6 mm polyp in the rectum, removed with a cold snare. Resected and retrieved. Diverticulosis in the sigmoid colon, in the descending colon and in the ascending colon. Small internal hemorrhoids. Both polyps removed were benign but one was precancerous. Repeat in 3 years.     Labs:  CBC w/ Differential Lab Results  Component Value Date  WBC 4.9 10/10/2023   RBC 4.04 (L) 10/10/2023   HGB 12.9 (L) 10/10/2023   HCT 38.6 10/10/2023   PLT 251 10/10/2023   MCV 95.5 10/10/2023   MCH 31.9 10/10/2023   MCHC 33.4 10/10/2023   RDW 11.8 10/10/2023   MPV 12.1 10/10/2023   LYMPHSABS 1.5 03/16/2023   MONOABS 0.6 03/16/2023   BASOSABS 29 10/10/2023    Comprehensive Metabolic Panel Lab Results  Component Value Date   NA 140 10/10/2023   K 4.7 10/10/2023   CL 107 10/10/2023   CO2 27 10/10/2023   GLUCOSE 101 (H) 10/10/2023   BUN 15 10/10/2023   CREATININE 0.80 10/10/2023   CALCIUM  9.4 10/10/2023   PROT 6.5 10/10/2023   ALBUMIN 4.6 03/16/2023   AST 26 10/10/2023   ALT 21 10/10/2023   ALKPHOS 51 03/16/2023   BILITOT 0.4 10/10/2023   GFR 77.19 03/16/2023   EGFR 96 10/10/2023   GFRNONAA >60 05/01/2021   Lipid Panel  Lab Results  Component Value Date   CHOL 165 10/10/2023    HDL 68 10/10/2023   LDLCALC 84 10/10/2023   TRIG 52 10/10/2023   A1c No results found for: HGBA1C  TSH Lab Results  Component Value Date   TSH 1.71 09/29/2020   PSA 10/10/2023 2.02 Assessment & Plan:   Orders Placed This Encounter  Procedures   LONG TERM MONITOR XT (3-14 DAYS)    Where should this test be performed?:   [facility]    Does the patient have an implanted cardiac device?:   No    Prescribed days of wear:   3    Type of enrollment:   Clinic Enrollment    Reason for Exam:   Palpitations R00.2    Release to patient:   Immediate   POCT URINALYSIS DIP (CLINITEK)   Palpitations: Physical exam revealed occasional irregular contraction. He says he can feel when his heart skips a beat.    Zio 3 day Cardiac monitor ordered. EKG performed. Patient would like to see Cardiologist regarding palpitations and for evaluation for possible heart disease. EKG shows sinus rhythm  Anxiety: treated with Alprazolam  0.25 mg twice daily as needed    Hyperlipidemia: treated with Rosuvastatin  5 mg daily. 10/10/2023 Lipid Panel normal.   GE Reflux: treated with Pantoprazole  40 mg daily.     History of snoring and sleep apnea.  He has CPAP device.   He has a history of BPH.   History of osteoarthritis of right hip and is status post right hip arthroplasty by Dr. Vernetta in February 2016.  He had left medial meniscectomy in 2019.    He cannot have steroid injections due to optical issues with his eyes.  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   He has a history of inflammatory bowel disease (left-sided ulcerative colitis) diagnosed in 2018 and is followed by gastroenterology. Last colonoscopy was 2023. Dr. Maudie is gastroenterologist.  He has Canasa  suppository and Lialda  tablet for inflammatory bowel disease. History of sessile serrated adenomatous colon polyp.  B-12 and folate  normal.    03/15/2021 Colonoscopy The examined portion of the ileum was normal. Colitis, indeterminate. Active inflammation in distal rectum ( proctitis). Inactive inflammation with pseudopolyps in the proximal sigmoid, descending colon and in the distal transverse colon. This was graded as Mayo Score 2 ( moderate disease). Biopsied. One 4 mm polyp in  the cecum, removed with a cold snare. Resected and retrieved. One 6 mm polyp in the rectum, removed with a cold snare. Resected and retrieved. Diverticulosis in the sigmoid colon, in the descending colon and in the ascending colon. Small internal hemorrhoids. Both polyps removed were benign but one was precancerous. Repeat in 3 years.   Vaccine counseling: Influenza vaccine received today,  Reminded that Pneumonia, Shingles second dose, and  Covid-19 vaccines due.     Annual Wellness Visit done today including the all of the following: Reviewed patient's Family Medical History Reviewed patient's SDOH and reviewed tobacco, alcohol, and drug use.  Reviewed and updated list of patient's medical providers Assessment of cognitive impairment was done Assessed patient's functional ability Established a written schedule for health screening services Health Risk Assessent Completed and Reviewed  Discussed health benefits of physical activity, and encouraged him to engage in regular exercise appropriate for his age and condition.    I,Makayla C Reid,acting as a scribe for Ronal JINNY Hailstone, MD.,have documented all relevant documentation on the behalf of Ronal JINNY Hailstone, MD,as directed by  Ronal JINNY Hailstone, MD while in the presence of Ronal JINNY Hailstone, MD.  I, Ronal JINNY Hailstone, MD, have reviewed all documentation for and agree with the above Annual Wellness Visit documentation.  Ronal JINNY Hailstone, MD Internal Medicine 10/16/2023

## 2023-10-16 NOTE — Progress Notes (Unsigned)
 EP to read.

## 2023-10-16 NOTE — Patient Instructions (Signed)

## 2023-10-17 NOTE — Patient Instructions (Addendum)
 We have ordered Zio monitor. Patient requests cardiology consult for evaluation of heart health with palpitations. Has situational stress with wife who has terminal cancer. He is sole caregiver.

## 2023-10-18 ENCOUNTER — Ambulatory Visit

## 2023-10-19 ENCOUNTER — Encounter: Admitting: Physical Therapy

## 2023-10-24 ENCOUNTER — Ambulatory Visit: Attending: Family Medicine | Admitting: Physical Therapy

## 2023-10-24 DIAGNOSIS — M79601 Pain in right arm: Secondary | ICD-10-CM | POA: Insufficient documentation

## 2023-10-24 DIAGNOSIS — R293 Abnormal posture: Secondary | ICD-10-CM | POA: Insufficient documentation

## 2023-10-24 DIAGNOSIS — R252 Cramp and spasm: Secondary | ICD-10-CM | POA: Diagnosis present

## 2023-10-24 NOTE — Therapy (Signed)
 OUTPATIENT PHYSICAL THERAPY UPPER EXTREMITY  Patient Name: James [name] MRN: [id number] DOB:1955/01/18, 68 y.o., male Today's Date: 10/24/2023  END OF SESSION:  PT End of Session - 10/24/23 1535     Visit Number 5    Number of Visits 13    Date for Recertification  11/16/23    Authorization Type Medicare    Authorization Time Period ABN for DN signed on 10/7 for Oct    Progress Note Due on Visit 10    PT Start Time 1532    PT Stop Time 1615    PT Time Calculation (min) 43 min    Activity Tolerance Patient tolerated treatment well             Past Medical History:  Diagnosis Date   Anemia    Aortic atherosclerosis    Barrett's esophagus    Cancer (HCC)    skin   Colitis    Degenerative disc disease    Diverticulosis 2013   Colonoscopy   GERD (gastroesophageal reflux disease)    Hiatal hernia    Hx of colonic polyps 2013   Colonoscopy- tubular adenoma, and hyperplastic    Hyperlipidemia    Internal hemorrhoids 2013   Colonoscopy   Lower back pain    Right inguinal hernia    Sleep apnea 2015   on CPAP   Tubular adenoma    Past Surgical History:  Procedure Laterality Date   COLONOSCOPY     HERNIA REPAIR  1980   left inguinal   KNEE ARTHROSCOPY  2018   TOTAL HIP ARTHROPLASTY Right 03/14/2014   Procedure: RIGHT TOTAL HIP ARTHROPLASTY ANTERIOR APPROACH ;  Surgeon: Lonni CINDERELLA Poli, MD;  Location: [facility];  Service: Orthopedics;  Laterality: Right;   TOTAL HIP ARTHROPLASTY Left 04/30/2021   Procedure: LEFT TOTAL HIP ARTHROPLASTY ANTERIOR APPROACH;  Surgeon: Poli Lonni CINDERELLA, MD;  Location: [facility];  Service: Orthopedics;  Laterality: Left;   WISDOM TOOTH EXTRACTION     Patient Active Problem List   Diagnosis Date Noted   Right arm pain 10/03/2023   Trigger point 10/03/2023   Centrilobular emphysema (HCC) 02/15/2022   Status post left hip replacement 04/30/2021   Unilateral primary osteoarthritis, left hip 04/29/2021   CSR (central serous  retinopathy) 03/28/2019   Status post arthroscopic knee surgery 08/09/2017   Unilateral primary osteoarthritis, left knee 04/10/2017   Status post arthroscopy of left knee 03/23/2017   Barrett's esophagus 05/30/2016   OSA (obstructive sleep apnea) 12/23/2015   Exertional dyspnea 12/23/2015   Status post total replacement of right hip 03/14/2014   Osteoarthritis of right hip 03/04/2014   Allergic rhinitis 03/04/2014   History of adenomatous polyp of colon 08/19/2011   Left sided colitis (HCC) 08/19/2011   GE reflux 11/14/2010   Low back pain 11/14/2010   History of smoking 11/14/2010   Hyperlipidemia 11/14/2010    PCP: Ronal Hailstone, MD  REFERRING PROVIDER: Raymond de Peru, MD  REFERRING DIAG:  Diagnosis  M79.601 (ICD-10-CM) - Right arm pain  M79.10 (ICD-10-CM) - Trigger point    THERAPY DIAG:  Right arm pain  Abnormal posture  Rationale for Evaluation and Treatment: Rehabilitation  ONSET DATE: 6 months ago   SUBJECTIVE:  SUBJECTIVE STATEMENT: Thought the DN was helpful.  No pain at the moment.  Haven't had the sharp pain.  Typically pain is in the biceps area but I want you to needle the muscles in my neck and here (upper traps/levators). Pt reports he misplaced HEP papers.    EVAL: Ongoing Rt arm and shoulder blade area pain x almost 6 months.  The pain in the arm happens with transferring my wife or lifting something heavy.   I also have pain in the scapula/shoulder blade that feels like a spot I can touch.  I am staying in the rehab facility with my wife currently.  It can hurt even if I just hold my mug in that hand for awhile.   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  She is currently in rehab.   PAIN:  Are you having  pain? Yes: NPRS scale: 0/10  Pain location: mid bicep belly  Pain description: a sharp pain  Aggravating factors: using the arm and lifting  Relieving factors: bengay    PRECAUTIONS: None  RED FLAGS: None   WEIGHT BEARING RESTRICTIONS: No  FALLS:  Has patient fallen in last 6 months? 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  DIAGNOSTIC FINDINGS:  Rt shoulder MRI in 2019 showing Rt UT prominence but no explanation for palpable abnormality. (Pt reports his wife noticed his shoulder blade looked like it was popping out but it was not painful at that time)   PATIENT SURVEYS :  Quick Dash:  QUICK DASH  Please rate your ability do the following activities in the last week by selecting the number below the appropriate response.   Activities Rating  Open a tight or new jar.  1 = No difficulty   Do heavy household chores (e.g., wash walls, floors). 1 = No difficulty   Carry a shopping bag or briefcase 1 = No difficulty   Wash your back. 1 = No difficulty   Use a knife to cut food. 1 = No difficulty   Recreational activities in which you take some force or impact through your arm, shoulder or hand (e.g., golf, hammering, tennis, etc.). 2 = Mild difficulty  During the past week, to what extent has your arm, shoulder or hand problem interfered with your normal social activities with family, friends, neighbors or groups?  1 = Not at all  During the past week, were you limited in your work or other regular daily activities as a result of your arm, shoulder or hand problem? 1 = Not limited at all  Rate the severity of the following symptoms in the  last week: Arm, Shoulder, or hand pain. 4 = Severe  Rate the severity of the following symptoms in the last week: Tingling (pins and needles) in your arm, shoulder or hand. 1 = none  During the past week, how much difficulty have you had sleeping because of the pain in your arm, shoulder or hand?  2 = Mild difficulty   (A QuickDASH score may not be calculated if there is greater than 1 missing item.)  Quick Dash Disability/Symptom  Score: [(sum of 16 (n) responses/11 (n)] x 25 = 36  Minimally Clinically Important Difference (MCID): 15-20 points  (Franchignoni, F. et al. (2013). Minimally clinically important difference of the disabilities of the arm, shoulder, and hand outcome measures (DASH) and its shortened version (Quick DASH). Journal of Orthopaedic & Sports Physical Therapy, 44(1), 30-39)   COGNITION: Overall cognitive status: Within functional limits for tasks assessed     SENSATION: WFL  POSTURE: In standing: stands with arms behind torso, appearance of possible scoliosis but pt is not aware of this, Rt scapula more protracted and upwardly rotated compared to the Lt.    UPPER EXTREMITY ROM:  All ROM WNL without pain: scapular dyskinesia noted with active flexion and abduction   UPPER EXTREMITY MMT:  MMT Right eval Left eval  Shoulder flexion 5 5  Shoulder extension 5 5  Shoulder abduction 5 pn at rhomboid 5  Shoulder adduction    Shoulder internal rotation 5 5  Shoulder external rotation 5 5  Middle trapezius 4+ 4+  Lower trapezius 4 4  Elbow flexion 5 5  Elbow extension 5 5  Wrist flexion    Wrist extension    Wrist ulnar deviation    Wrist radial deviation    Wrist pronation    Wrist supination    Grip strength (lbs)    (Blank rows = not tested)  SHOULDER SPECIAL TESTS: All noted negative at MD visit yesterday.   JOINT MOBILITY TESTING:  Rt shoulder more elevated in supine   PALPATION:  No ttp in the Rt arm, +1 ttp to the Rt rhomboid and under the  medial border of the mid scapula  Pain with CPA of T4, T3, T2, C7 Pain in the neck prone with rotation to the Left but not Rt                                                                                                                             TREATMENT DATE:  10/24/23: Reprint of HEP per patient request Thoracic extension with ball hands behind neck 10x Standing rows green band x 15 - VC for proper form and to keep shoulders from hiking up when pulling the band back  Standing bil shoulder extension green band 15x (Added to HEP- see below) pt given green band for home increased resistance Levator scap stretch with review for technique Trigger Point Dry Needling Subsequent Treatment: Pt instructed on Dry Needling rational, procedures, and possible side effects. Pt instructed to expect mild to moderate muscle soreness later in the day and/or into the next day.  Pt instructed in methods to reduce muscle soreness. Pt instructed to continue prescribed HEP. Because Dry Needling was performed over or adjacent to a lung field, pt was educated on S/S of pneumothorax and to seek immediate medical attention should they occur.  Patient was educated on signs and symptoms of infection and other risk factors and advised to seek medical attention should they occur.  Patient verbalized understanding of these instructions and education.  Patient Verbal Consent Given: Yes Education Handout Provided: Yes Muscles Treated: Bil cervical multifidi, bil upper traps, bil levator scap; right suprapinatus, right infraspinatus Electrical Stimulation Performed: No Treatment Response/Outcome: Utilized skilled palpation to identify bony landmarks and trigger points.  Able to illicit twitch response and muscle elongation.       10/16/23  Manual therapy:  Trigger Point Dry Needling  Initial Treatment: Pt instructed on Dry Needling rational, procedures, and possible side effects. Pt instructed to expect mild to  moderate muscle soreness later in the day and/or into the next day.  Pt instructed in methods to reduce muscle soreness. Pt instructed to continue prescribed HEP. Because Dry Needling was performed over or adjacent to a lung field, pt was educated on S/S of pneumothorax and to seek immediate medical attention should they occur.  Patient was educated on signs and symptoms of infection and other risk factors and advised to seek medical attention should they occur.  Patient verbalized understanding of these instructions and education.   Patient Verbal Consent Given: Yes Education Handout Provided: Yes Muscles Treated: Rt thoracic multifidi, Rt rhomboids, Rt subscapularis  Electrical Stimulation Performed: No Treatment Response/Outcome: Utilized skilled palpation to identify bony landmarks and trigger points.  Able to illicit twitch response and muscle elongation.      Scapular mobilizations - upward rotation, downward rotation, retraction  STM to rhomboids and scapular borders Thoracic mobs - CPA & UPA Grade III-IV T2 - T8 Therapeutic Exercise:  UBE 2'/2' each direction  Scapular retractions 2 x 10 Standing rows RTB x 15 - VC for proper form and to keep shoulders from hiking up when pulling the band back  Pallof pres RTB x 10 bil - VC to keep shoulders from coming up throughout D2 extension RTB x 10 bil - VC to only move through available motion while keeping proper form   10/13/23 Manual therapy:  Scapular mobilizations - upward rotation, downward rotation, retraction  STM to rhomboids and scapular borders Thoracic mobs - CPA & UPA Grade III-IV T2 - T8 Therapeutic Exercise:  UBE 2'/2' each direction  R LS stretch 3 x 20  Seated thoracic extension with half foam roller 1 x 5 with 10 hold with vcs to decrease excessive neck extension due to pain here Scapular retractions 2 x 10 Standing rows RTB x 15 Education on DL squats and correct form and added to HEP x 10 Therapeutic Activity:   Educated pt on various techniques, including stand pivot transfers and slide board transfers for transferring his wife to/from the bed to the [facility] and had him practice multiple times Educated pt on purpose of a slideboard and stander transfer device  10/10/23 Manual therapy:  Scapular mobilizations - upward rotation, downward rotation, retraction  STM to rhomboids and scapular borders Thoracic mobs - CPA & UPA Grade III-IV T2 - T8 Therapeutic Exercise:  UBE 2'/2' each direction  R LS stretch 3 x 20  R Rhomboid stretch 3 x 20 Seated thoracic extension with half foam roller 2 x 10 with 3 hold with vcs to bend with thoracic spine and not compensating with cervical spine.   Scapular retractions 2 x 10    PATIENT EDUCATION: Education details: per today's note Person educated: Patient Education method: Explanation Education comprehension: verbalized understanding  HOME EXERCISE PROGRAM: Access Code: [email or web address] URL: [email or web address] Date: 10/24/2023 Prepared by: Glade Pesa  Exercises - Seated Scapular Retraction  - 2-3 x daily - 7 x weekly -  1 sets - 10 reps - 2-3 hold - Standing Row with Anchored Resistance  - 1 x daily - 3-4 x weekly - 1-3 sets - 10 reps - 2-3 second hold - Shoulder extension with resistance - Neutral  - 1 x daily - 7 x weekly - 2 sets - 10 reps - Seated Thoracic Lumbar Extension with Pectoralis Stretch  - 2-3 x daily - 7 x weekly - 1 sets - 5-10 reps - 10 sec hold - Standing Shoulder Posterior Capsule Stretch  - 2-3 x daily - 7 x weekly - 1 sets - 1-3 reps - 20-30 seconds hold - Seated Levator Scapulae Stretch  - 2-3 x daily - 7 x weekly - 1 sets - 1-3 reps - 20-30 seconds hold - Squat  - 1-2 x daily - 7 x weekly - 1-3 sets - 10 reps - no hold  ASSESSMENT:  CLINICAL IMPRESSION: The patient benefits from dry needling and manual therapy to stimulate underlying myofascial trigger points and muscular tissue for management of neuromusculoskeletal  pain and address movement impairments.  Much improved soft tissue mobility and decreased tender point size and number following treatment session.   The patient was encouraged in regular performance of HEP post DN including soft tissue lengthening and strengthening exercises to enhance long term benefit.  OBJECTIVE IMPAIRMENTS: decreased activity tolerance, decreased knowledge of condition, impaired flexibility, impaired UE functional use, and postural dysfunction.   ACTIVITY LIMITATIONS: lifting  PARTICIPATION LIMITATIONS: none  PERSONAL FACTORS: Time since onset of injury/illness/exacerbation are also affecting patient's functional outcome.   REHAB POTENTIAL: Good  CLINICAL DECISION MAKING: Stable/uncomplicated  EVALUATION COMPLEXITY: Low  GOALS: Goals reviewed with patient? Yes  SHORT TERM GOALS: Target date: 10/18/23  Pt will be educated on proper lifting and transferring posture to minimize arm pain  Baseline: Goal status: INITIAL   LONG TERM GOALS: Target date: 11/16/23  Pt will have no increased rhomboid pain when sitting in a chair  Baseline:  Goal status: INITIAL  2.  Pt will have no pain in the Rt upper arm when moving his wife around in bed  Baseline:  Goal status: INITIAL  3.  Pt will be ind with final HEP for spinal mobility and scapular strengthening  Baseline:  Goal status: INITIAL   PLAN: PT FREQUENCY: 2x/week  PT DURATION: 6 weeks  PLANNED INTERVENTIONS: 97164- PT Re-evaluation, 97110-Therapeutic exercises, 97530- Therapeutic activity, 97112- Neuromuscular re-education, 97535- Self Care, 02859- Manual therapy, [id number]- Electrical stimulation (unattended), 97035- Ultrasound, 02987- Traction (mechanical), 20560 (1-2 muscles), 20561 (3+ muscles)- Dry Needling, and Patient/Family education, ice, heat  PLAN FOR NEXT SESSION: assess response to dry needling  #2, consider KT right shoulder; spinal mobility and general Rt postural strengthening, MT, practice body  positioning when transferring his wife, supine I's, T's and Y's to strengthen trapezius muscle   Glade Pesa, PT 10/24/23 5:27 PM Phone: [phone] Fax: [phone]

## 2023-10-25 ENCOUNTER — Ambulatory Visit (INDEPENDENT_AMBULATORY_CARE_PROVIDER_SITE_OTHER)

## 2023-10-25 VITALS — BP 130/70 | Ht 71.0 in | Wt 184.0 lb

## 2023-10-25 DIAGNOSIS — Z0000 Encounter for general adult medical examination without abnormal findings: Secondary | ICD-10-CM

## 2023-10-25 NOTE — Patient Instructions (Signed)
Next appointment: Follow up in one year for your annual wellness visit.  Preventive Care 2 Years and Older, Male Preventive care refers to lifestyle choices and visits with your health care provider that can promote health and wellness. What does preventive care include? A yearly physical exam. This is also called an annual well check. Dental exams once or twice a year. Routine eye exams. Ask your health care provider how often you should have your eyes checked. Personal lifestyle choices, including: Daily care of your teeth and gums. Regular physical activity. Eating a healthy diet. Avoiding tobacco and drug use. Limiting alcohol use. Practicing safe sex. Taking low doses of aspirin every day. Taking vitamin and mineral supplements as recommended by your health care provider. What happens during an annual well check? The services and screenings done by your health care provider during your annual well check will depend on your age, overall health, lifestyle risk factors, and family history of disease. Counseling  Your health care provider may ask you questions about your: Alcohol use. Tobacco use. Drug use. Emotional well-being. Home and relationship well-being. Sexual activity. Eating habits. History of falls. Memory and ability to understand (cognition). Work and work Astronomer. Screening  You may have the following tests or measurements: Height, weight, and BMI. Blood pressure. Lipid and cholesterol levels. These may be checked every 5 years, or more frequently if you are over 48 years old. Skin check. Lung cancer screening. You may have this screening every year starting at age 16 if you have a 30-pack-year history of smoking and currently smoke or have quit within the past 15 years. Fecal occult blood test (FOBT) of the stool. You may have this test every year starting at age 13. Flexible sigmoidoscopy or colonoscopy. You may have a sigmoidoscopy every 5 years or a  colonoscopy every 10 years starting at age 54. Prostate cancer screening. Recommendations will vary depending on your family history and other risks. Hepatitis C blood test. Hepatitis B blood test. Sexually transmitted disease (STD) testing. Diabetes screening. This is done by checking your blood sugar (glucose) after you have not eaten for a while (fasting). You may have this done every 1-3 years. Abdominal aortic aneurysm (AAA) screening. You may need this if you are a current or former smoker. Osteoporosis. You may be screened starting at age 64 if you are at high risk. Talk with your health care provider about your test results, treatment options, and if necessary, the need for more tests. Vaccines  Your health care provider may recommend certain vaccines, such as: Influenza vaccine. This is recommended every year. Tetanus, diphtheria, and acellular pertussis (Tdap, Td) vaccine. You may need a Td booster every 10 years. Zoster vaccine. You may need this after age 10. Pneumococcal 13-valent conjugate (PCV13) vaccine. One dose is recommended after age 63. Pneumococcal polysaccharide (PPSV23) vaccine. One dose is recommended after age 65. Talk to your health care provider about which screenings and vaccines you need and how often you need them. This information is not intended to replace advice given to you by your health care provider. Make sure you discuss any questions you have with your health care provider. Document Released: 01/30/2015 Document Revised: 09/23/2015 Document Reviewed: 11/04/2014 Elsevier Interactive Patient Education  2017 [employer].  Fall Prevention in the Home Falls can cause injuries. They can happen to people of all ages. There are many things you can do to make your home safe and to help prevent falls. What can I do on  the outside of my home? Regularly fix the edges of walkways and driveways and fix any cracks. Remove anything that might make you trip as you  walk through a door, such as a raised step or threshold. Trim any bushes or trees on the path to your home. Use bright outdoor lighting. Clear any walking paths of anything that might make someone trip, such as rocks or tools. Regularly check to see if handrails are loose or broken. Make sure that both sides of any steps have handrails. Any raised decks and porches should have guardrails on the edges. Have any leaves, snow, or ice cleared regularly. Use sand or salt on walking paths during winter. Clean up any spills in your garage right away. This includes oil or grease spills. What can I do in the bathroom? Use night lights. Install grab bars by the toilet and in the tub and shower. Do not use towel bars as grab bars. Use non-skid mats or decals in the tub or shower. If you need to sit down in the shower, use a plastic, non-slip stool. Keep the floor dry. Clean up any water that spills on the floor as soon as it happens. Remove soap buildup in the tub or shower regularly. Attach bath mats securely with double-sided non-slip rug tape. Do not have throw rugs and other things on the floor that can make you trip. What can I do in the bedroom? Use night lights. Make sure that you have a light by your bed that is easy to reach. Do not use any sheets or blankets that are too big for your bed. They should not hang down onto the floor. Have a firm chair that has side arms. You can use this for support while you get dressed. Do not have throw rugs and other things on the floor that can make you trip. What can I do in the kitchen? Clean up any spills right away. Avoid walking on wet floors. Keep items that you use a lot in easy-to-reach places. If you need to reach something above you, use a strong step stool that has a grab bar. Keep electrical cords out of the way. Do not use floor polish or wax that makes floors slippery. If you must use wax, use non-skid floor wax. Do not have throw rugs  and other things on the floor that can make you trip. What can I do with my stairs? Do not leave any items on the stairs. Make sure that there are handrails on both sides of the stairs and use them. Fix handrails that are broken or loose. Make sure that handrails are as long as the stairways. Check any carpeting to make sure that it is firmly attached to the stairs. Fix any carpet that is loose or worn. Avoid having throw rugs at the top or bottom of the stairs. If you do have throw rugs, attach them to the floor with carpet tape. Make sure that you have a light switch at the top of the stairs and the bottom of the stairs. If you do not have them, ask someone to add them for you. What else can I do to help prevent falls? Wear shoes that: Do not have high heels. Have rubber bottoms. Are comfortable and fit you well. Are closed at the toe. Do not wear sandals. If you use a stepladder: Make sure that it is fully opened. Do not climb a closed stepladder. Make sure that both sides of the stepladder are locked  into place. Ask someone to hold it for you, if possible. Clearly mark and make sure that you can see: Any grab bars or handrails. First and last steps. Where the edge of each step is. Use tools that help you move around (mobility aids) if they are needed. These include: Canes. Walkers. Scooters. Crutches. Turn on the lights when you go into a dark area. Replace any light bulbs as soon as they burn out. Set up your furniture so you have a clear path. Avoid moving your furniture around. If any of your floors are uneven, fix them. If there are any pets around you, be aware of where they are. Review your medicines with your doctor. Some medicines can make you feel dizzy. This can increase your chance of falling. Ask your doctor what other things that you can do to help prevent falls. This information is not intended to replace advice given to you by your health care provider. Make sure  you discuss any questions you have with your health care provider. Document Released: 10/30/2008 Document Revised: 06/11/2015 Document Reviewed: 02/07/2014 Elsevier Interactive Patient Education  2017 [employer].

## 2023-10-25 NOTE — Progress Notes (Addendum)
 Subjective:   James [name] is a 68 y.o. male who presents for Medicare Annual/Subsequent preventive examination.  Visit Complete: Virtual I connected with  James [name] on 10/25/23 by a audio enabled telemedicine application and verified that I am speaking with the correct person using two identifiers.  Patient Location: Home  Provider Location: Home Office  I discussed the limitations of evaluation and management by telemedicine. The patient expressed understanding and agreed to proceed.  Vital Signs: Because this visit was a virtual/telehealth visit, some criteria may be missing or patient reported. Any vitals not documented were not able to be obtained and vitals that have been documented are patient reported.  Patient Medicare AWV questionnaire was completed by the patient on 10/25/2023; I have confirmed that all information answered by patient is correct and no changes since this date.  Cardiac Risk Factors include: advanced age (>56men, >62 women);male gender;dyslipidemia     Objective:    Today's Vitals   10/25/23 1304 10/25/23 1310  BP: 130/70   Weight: 184 lb (83.5 kg)   Height: 5' 11 (1.803 m)   PainSc:  0-No pain   Body mass index is 25.66 kg/m.     10/25/2023    1:10 PM 10/04/2023    4:18 PM 10/11/2022   10:54 AM 10/04/2021   10:54 AM 04/30/2021    5:03 PM 04/19/2021    1:11 PM 05/24/2016    7:29 AM  Advanced Directives  Does Patient Have a Medical Advance Directive? No No No;Yes Yes Yes Yes Yes   Type of Advance Directive   Living will;Healthcare Power of [employer] Power of [place];Living will Healthcare Power of [place];Living will Healthcare Power of [place];Living will Healthcare Power of [place];Living will  Does patient want to make changes to medical advance directive?    No - Patient declined No - Patient declined    Copy of Healthcare Power of Attorney in Chart?    No - copy requested Yes - validated most recent copy scanned in chart  (See row information) No - copy requested No - copy requested   Would patient like information on creating a medical advance directive? No - Patient declined No - Patient declined          Data saved with a previous flowsheet row definition    Current Medications (verified) Outpatient Encounter Medications as of 10/25/2023  Medication Sig   ALPRAZolam  (XANAX ) 0.25 MG tablet TAKE 1 TABLET(0.25 MG) BY MOUTH TWICE DAILY AS NEEDED FOR ANXIETY   cholecalciferol  (VITAMIN D3) 25 MCG (1000 UNIT) tablet Take 1,000 Units by mouth daily.   ibuprofen  (ADVIL ) 800 MG tablet TAKE 1 TABLET(800 MG) BY MOUTH EVERY 8 HOURS AS NEEDED   mesalamine  (CANASA ) 1000 MG suppository UNWRAP AND INSERT 1 SUPPOSITORY(1000 MG) RECTALLY AT BEDTIME   mesalamine  (LIALDA ) 1.2 g EC tablet TAKE 4 TABLETS(4.8 GRAMS) BY MOUTH DAILY WITH BREAKFAST   Multiple Vitamin (MULTIVITAMIN) tablet Take 1 tablet by mouth daily.   oxymetazoline (AFRIN) 0.05 % nasal spray Place 4 sprays into both nostrils at bedtime.   pantoprazole  (PROTONIX ) 40 MG tablet TAKE 1 TABLET BY MOUTH EVERY DAY   rosuvastatin  (CRESTOR ) 5 MG tablet TAKE 1 TABLET(5 MG) BY MOUTH DAILY   No facility-administered encounter medications on file as of 10/25/2023.    Allergies (verified) Prednisone   History: Past Medical History:  Diagnosis Date   Anemia    Aortic atherosclerosis    Barrett's esophagus    Cancer (HCC)  skin   Colitis    Degenerative disc disease    Diverticulosis 2013   Colonoscopy   GERD (gastroesophageal reflux disease)    Hiatal hernia    Hx of colonic polyps 2013   Colonoscopy- tubular adenoma, and hyperplastic    Hyperlipidemia    Internal hemorrhoids 2013   Colonoscopy   Lower back pain    Right inguinal hernia    Sleep apnea 2015   on CPAP   Tubular adenoma    Past Surgical History:  Procedure Laterality Date   COLONOSCOPY     HERNIA REPAIR  1980   left inguinal   KNEE ARTHROSCOPY  2018   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   Procedure: RIGHT TOTAL HIP ARTHROPLASTY ANTERIOR APPROACH ;  Surgeon: Lonni CINDERELLA Poli, MD;  Location: [facility];  Service: Orthopedics;  Laterality: Right;   TOTAL HIP ARTHROPLASTY Left 04/30/2021   Procedure: LEFT TOTAL HIP ARTHROPLASTY ANTERIOR APPROACH;  Surgeon: Poli Lonni CINDERELLA, MD;  Location: [facility];  Service: Orthopedics;  Laterality: Left;   WISDOM TOOTH EXTRACTION     Family History  Problem Relation Age of Onset   Colon cancer Mother        late 84s   Heart disease Father    Colon cancer Father 2   Esophageal cancer Father        late 4's   Stomach cancer Neg Hx    AAA (abdominal aortic aneurysm) Neg Hx    Rectal cancer Neg Hx    Colon polyps Neg Hx    Social History   Socioeconomic History   Marital status: Married    Spouse name: Not on file   Number of children: 5   Years of education: Not on file   Highest education level: Some college, no degree  Occupational History   Occupation: Editor, commissioning  Tobacco Use   Smoking status: Former    Current packs/day: 0.00    Average packs/day: 0.3 packs/day for 30.0 years (7.5 ttl pk-yrs)    Types: Cigarettes    Start date: 03/09/1984    Quit date: 03/09/2014    Years since quitting: 9.6   Smokeless tobacco: Never   Tobacco comments:    Counseling sheet given 08-2011  Vaping Use   Vaping status: Never Used  Substance and Sexual Activity   Alcohol use: Not Currently    Alcohol/week: 10.0 standard drinks of alcohol    Types: 10 Glasses of wine per week    Comment: week   Drug use: No   Sexual activity: Not on file  Other Topics Concern   Not on file  Social History Narrative   Not on file   Social Drivers of Health   Financial Resource Strain: Low Risk  (10/25/2023)   Overall Financial Resource Strain (CARDIA)    Difficulty of Paying Living Expenses: Not hard at all  Food Insecurity: No Food Insecurity (10/25/2023)   Hunger Vital Sign    Worried About Running Out of Food in the Last Year:  Never true    Ran Out of Food in the Last Year: Never true  Transportation Needs: No Transportation Needs (10/25/2023)   PRAPARE - Administrator, Civil Service (Medical): No    Lack of Transportation (Non-Medical): No  Physical Activity: Insufficiently Active (10/25/2023)   Exercise Vital Sign    Days of Exercise per Week: 3 days    Minutes of Exercise per Session: 30 min  Stress: No Stress Concern Present (10/25/2023)  [employer] of Occupational Health - Occupational Stress Questionnaire    Feeling of Stress: Only a little  Social Connections: Moderately Integrated (10/25/2023)   Social Connection and Isolation Panel    Frequency of Communication with Friends and Family: More than three times a week    Frequency of Social Gatherings with Friends and Family: Twice a week    Attends Religious Services: More than 4 times per year    Active Member of [employer] or Organizations: No    Attends Banker Meetings: Never    Marital Status: Married    Tobacco Counseling Counseling given: Not Answered Tobacco comments: Counseling sheet given 08-2011   Clinical Intake:  Pre-visit preparation completed: Yes  Pain : No/denies pain Pain Score: 0-No pain     BMI - recorded: 25.66 Nutritional Status: BMI 25 -29 Overweight Nutritional Risks: None Diabetes: No  How often do you need to have someone help you when you read instructions, pamphlets, or other written materials from your doctor or pharmacy?: 1 - Never  Interpreter Needed?: No  Information entered by :: Kathlynn Porto, CMA   Activities of Daily Living    10/25/2023    1:05 PM 10/23/2023    2:59 PM  In your present state of health, do you have any difficulty performing the following activities:  Hearing? 0 0  Vision? 0 0  Difficulty concentrating or making decisions? 0 0  Walking or climbing stairs? 0 0  Dressing or bathing? 0 0  Doing errands, shopping? 0 0  Preparing Food and eating ? N N   Using the Toilet? N N  In the past six months, have you accidently leaked urine? N N  Do you have problems with loss of bowel control? N N  Managing your Medications? N N  Managing your Finances? N N  Housekeeping or managing your Housekeeping? N N    Patient Care Team: Perri Ronal PARAS, MD as PCP - General (Internal Medicine)  Indicate any recent Medical Services you may have received from other than [facility] providers in the past year (date may be approximate).     Assessment:   This is a routine wellness examination for James [name].  Hearing/Vision screen No results found.   Goals Addressed   None    Depression Screen    10/25/2023    1:09 PM 10/16/2023    3:13 PM 10/03/2023    4:05 PM 04/05/2022   11:28 AM 10/04/2021   10:55 AM 10/02/2020   11:08 AM 08/19/2019   11:15 AM  PHQ 2/9 Scores  PHQ - 2 Score 0 0 0 0 0 1 0  PHQ- 9 Score 0 0 0   3     Fall Risk    10/25/2023    1:10 PM 10/23/2023    2:59 PM 10/03/2023    4:05 PM 04/05/2022   11:28 AM 10/04/2021   10:54 AM  Fall Risk   Falls in the past year? 0 0 0 0 0  Number falls in past yr: 0  0 0 0  Injury with Fall? 0  0 0 0  Risk for fall due to : No Fall Risks  No Fall Risks No Fall Risks No Fall Risks  Follow up Education provided;Falls prevention discussed;Falls evaluation completed  Falls evaluation completed Falls prevention discussed Falls evaluation completed      Data saved with a previous flowsheet row definition    MEDICARE RISK AT HOME: Medicare Risk at Home Any stairs in or  around the home?: Yes If so, are there any without handrails?: No Home free of loose throw rugs in walkways, pet beds, electrical cords, etc?: Yes Adequate lighting in your home to reduce risk of falls?: Yes Life alert?: No Use of a cane, walker or w/c?: No Grab bars in the bathroom?: No Shower chair or bench in shower?: No Elevated toilet seat or a handicapped toilet?: No  TIMED UP AND GO:  Was the test performed?  No    Cognitive  Function:        10/25/2023    1:09 PM 10/11/2022   10:55 AM 10/04/2021   10:56 AM 10/02/2020   11:18 AM  6CIT Screen  What Year? 0 points 0 points 0 points 0 points  What month? 0 points 0 points 0 points 0 points  What time? 0 points 0 points 0 points 0 points  Count back from 20 0 points 0 points 0 points 0 points  Months in reverse 0 points 0 points 0 points 0 points  Repeat phrase 0 points 0 points 0 points 10 points  Total Score 0 points 0 points 0 points 10 points    Immunizations Immunization History  Administered Date(s) Administered   DTaP 05/13/2011   Fluad Quad(high Dose 65+) 11/25/2021   Fluad Trivalent(High Dose 65+) 01/21/2023   Hep A / Hep B 10/05/2016, 11/09/2016, 04/04/2017   INFLUENZA, HIGH DOSE SEASONAL PF 10/31/2020, 10/16/2023   Influenza Whole 10/17/2005   Influenza,inj,Quad PF,6+ Mos 09/20/2012, 12/03/2013, 01/08/2015, 01/04/2016, 10/05/2016, 09/28/2017, 10/03/2019   Influenza-Unspecified 11/15/2016, 11/25/2021   Moderna Sars-Covid-2 Vaccination 03/21/2019, 04/23/2019, 01/08/2020   Pfizer Covid-19 Vaccine Bivalent Booster 14yrs & up 01/28/2021   Pfizer(Comirnaty)Fall Seasonal Vaccine 12 years and older 11/25/2021, 01/21/2023   Pneumococcal Conjugate-13 10/05/2016, 08/19/2019   Tdap 12/22/1995, 01/25/2019   Unspecified SARS-COV-2 Vaccination 11/25/2021   Zoster Recombinant(Shingrix) 01/28/2021   Zoster, Live 04/18/2014, 06/18/2014    TDAP status: Up to date  Flu Vaccine status: Up to date  Pneumococcal vaccine status: Due, Education has been provided regarding the importance of this vaccine. Advised may receive this vaccine at local pharmacy or Health Dept. Aware to provide a copy of the vaccination record if obtained from local pharmacy or Health Dept. Verbalized acceptance and understanding.  Covid-19 vaccine status: Information provided on how to obtain vaccines.   Qualifies for Shingles Vaccine? Yes   Zostavax completed Yes   Shingrix  Completed?: No.    Education has been provided regarding the importance of this vaccine. Patient has been advised to call insurance company to determine out of pocket expense if they have not yet received this vaccine. Advised may also receive vaccine at local pharmacy or Health Dept. Verbalized acceptance and understanding.  Screening Tests Health Maintenance  Topic Date Due   Pneumococcal Vaccine: 50+ Years (2 of 2 - PPSV23, PCV20, or PCV21) 10/14/2019   Zoster Vaccines- Shingrix (2 of 2) 03/25/2021   COVID-19 Vaccine (7 - Moderna risk 2024-25 season) 09/18/2023   Colonoscopy  03/15/2024   Medicare Annual Wellness (AWV)  10/15/2024   DTaP/Tdap/Td (4 - Td or Tdap) 01/24/2029   Influenza Vaccine  Completed   Hepatitis C Screening  Completed   Meningococcal B Vaccine  Aged Out   Hepatitis B Vaccines 19-59 Average Risk  Discontinued    Health Maintenance  Health Maintenance Due  Topic Date Due   Pneumococcal Vaccine: 50+ Years (2 of 2 - PPSV23, PCV20, or PCV21) 10/14/2019   Zoster Vaccines- Shingrix (2 of 2) 03/25/2021  COVID-19 Vaccine (7 - Moderna risk 2024-25 season) 09/18/2023    Colorectal cancer screening: Type of screening: Colonoscopy. Completed 03/15/2021. Repeat every 3 years  Lung Cancer Screening: (Low Dose CT Chest recommended if Age 2-80 years, 20 pack-year currently smoking OR have quit w/in 15years.) does not qualify.   Additional Screening:  Hepatitis C Screening: does not qualify; Completed   Vision Screening: Recommended annual ophthalmology exams for early detection of glaucoma and other disorders of the eye. Is the patient up to date with their annual eye exam?  Yes  Who is the provider or what is the name of the office in which the patient attends annual eye exams? [facility]  If pt is not established with a provider, would they like to be referred to a provider to establish care? No .   Dental Screening: Recommended annual dental exams for proper  oral hygiene   Community Resource Referral / Chronic Care Management: CRR required this visit?  No   CCM required this visit?  No     Plan:     I have personally reviewed and noted the following in the patient's chart:   Medical and social history Use of alcohol, tobacco or illicit drugs  Current medications and supplements including opioid prescriptions. Patient is not currently taking opioid prescriptions. Functional ability and status Nutritional status Physical activity Advanced directives List of other physicians Hospitalizations, surgeries, and ER visits in previous 12 months Vitals Screenings to include cognitive, depression, and falls Referrals and appointments  In addition, I have reviewed and discussed with patient certain preventive protocols, quality metrics, and best practice recommendations. A written personalized care plan for preventive services as well as general preventive health recommendations were provided to patient.     Brenten Janney Zelda, CMA   10/25/2023   After Visit Summary: (Mail) Due to this being a telephonic visit, the after visit summary with patients personalized plan was offered to patient via mail    I, Ronal JINNY Hailstone, MD, have reviewed all documentation for this visit. The documentation on 10/25/2023 for the exam, diagnosis, procedures, and orders are all accurate and complete.

## 2023-10-26 ENCOUNTER — Ambulatory Visit (HOSPITAL_BASED_OUTPATIENT_CLINIC_OR_DEPARTMENT_OTHER): Admitting: Pulmonary Disease

## 2023-10-26 ENCOUNTER — Ambulatory Visit: Attending: Family Medicine | Admitting: Physical Therapy

## 2023-10-30 ENCOUNTER — Ambulatory Visit (INDEPENDENT_AMBULATORY_CARE_PROVIDER_SITE_OTHER)

## 2023-10-30 VITALS — BP 120/80 | HR 80 | Ht 71.0 in | Wt 184.0 lb

## 2023-10-30 DIAGNOSIS — Z23 Encounter for immunization: Secondary | ICD-10-CM | POA: Diagnosis not present

## 2023-10-30 NOTE — Progress Notes (Signed)
 Patient presents for a Pneumonia 20 vaccine. Patient received a pneumonia 20 vaccine, L deltoid IM.

## 2023-10-31 ENCOUNTER — Encounter: Admitting: Physical Therapy

## 2023-11-02 ENCOUNTER — Encounter: Admitting: Physical Therapy

## 2023-11-07 ENCOUNTER — Encounter: Payer: Self-pay | Admitting: Physical Therapy

## 2023-11-07 ENCOUNTER — Ambulatory Visit: Admitting: Physical Therapy

## 2023-11-07 DIAGNOSIS — M79601 Pain in right arm: Secondary | ICD-10-CM

## 2023-11-07 DIAGNOSIS — R293 Abnormal posture: Secondary | ICD-10-CM

## 2023-11-07 NOTE — Therapy (Signed)
 OUTPATIENT PHYSICAL THERAPY UPPER EXTREMITY  Patient Name: James [name] MRN: [id number] DOB:June 25, 1955, 68 y.o., male Today's Date: 11/07/2023  END OF SESSION:  PT End of Session - 11/07/23 1532     Visit Number 6    Number of Visits 13    Date for Recertification  11/16/23    Authorization Type Medicare    Authorization Time Period ABN for DN signed on 10/7 for Oct    Progress Note Due on Visit 10    PT Start Time 1533    PT Stop Time 1614    PT Time Calculation (min) 41 min    Activity Tolerance Patient tolerated treatment well             Past Medical History:  Diagnosis Date   Anemia    Aortic atherosclerosis    Barrett's esophagus    Cancer (HCC)    skin   Colitis    Degenerative disc disease    Diverticulosis 2013   Colonoscopy   GERD (gastroesophageal reflux disease)    Hiatal hernia    Hx of colonic polyps 2013   Colonoscopy- tubular adenoma, and hyperplastic    Hyperlipidemia    Internal hemorrhoids 2013   Colonoscopy   Lower back pain    Right inguinal hernia    Sleep apnea 2015   on CPAP   Tubular adenoma    Past Surgical History:  Procedure Laterality Date   COLONOSCOPY     HERNIA REPAIR  1980   left inguinal   KNEE ARTHROSCOPY  2018   TOTAL HIP ARTHROPLASTY Right 03/14/2014   Procedure: RIGHT TOTAL HIP ARTHROPLASTY ANTERIOR APPROACH ;  Surgeon: Lonni CINDERELLA Poli, MD;  Location: [facility];  Service: Orthopedics;  Laterality: Right;   TOTAL HIP ARTHROPLASTY Left 04/30/2021   Procedure: LEFT TOTAL HIP ARTHROPLASTY ANTERIOR APPROACH;  Surgeon: Poli Lonni CINDERELLA, MD;  Location: [facility];  Service: Orthopedics;  Laterality: Left;   WISDOM TOOTH EXTRACTION     Patient Active Problem List   Diagnosis Date Noted   Right arm pain 10/03/2023   Trigger point 10/03/2023   Centrilobular emphysema (HCC) 02/15/2022   Status post left hip replacement 04/30/2021   Unilateral primary osteoarthritis, left hip 04/29/2021   CSR (central serous  retinopathy) 03/28/2019   Status post arthroscopic knee surgery 08/09/2017   Unilateral primary osteoarthritis, left knee 04/10/2017   Status post arthroscopy of left knee 03/23/2017   Barrett's esophagus 05/30/2016   OSA (obstructive sleep apnea) 12/23/2015   Exertional dyspnea 12/23/2015   Status post total replacement of right hip 03/14/2014   Osteoarthritis of right hip 03/04/2014   Allergic rhinitis 03/04/2014   History of adenomatous polyp of colon 08/19/2011   Left sided colitis (HCC) 08/19/2011   GE reflux 11/14/2010   Low back pain 11/14/2010   History of smoking 11/14/2010   Hyperlipidemia 11/14/2010    PCP: Ronal Hailstone, MD  REFERRING PROVIDER: Raymond de Peru, MD  REFERRING DIAG:  Diagnosis  M79.601 (ICD-10-CM) - Right arm pain  M79.10 (ICD-10-CM) - Trigger point    THERAPY DIAG:  Right arm pain  Abnormal posture  Rationale for Evaluation and Treatment: Rehabilitation  ONSET DATE: 6 months ago   SUBJECTIVE:  SUBJECTIVE STATEMENT: My wife is bedridden.  Hospice said she had hours to days to live but she defies what they say.  I haven't been able to do the ex's b/c of my wife's situation.  Pain is in bicep/tricep area with moving the chuck to move her in the bed. I'm doing better turning my head.    EVAL: Ongoing Rt arm and shoulder blade area pain x almost 6 months.  The pain in the arm happens with transferring my wife or lifting something heavy.   I also have pain in the scapula/shoulder blade that feels like a spot I can touch.  I am staying in the rehab facility with my wife currently.  It can hurt even if I just hold my mug in that hand for awhile.   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  She is currently in  rehab.   PAIN:  Are you having pain? Yes: NPRS scale: 0/10  Pain location: mid bicep belly only with lift the chuck a certain way, neck and post shoulder  Pain description: a sharp pain  Aggravating factors: using the arm and lifting  Relieving factors: bengay    PRECAUTIONS: None  RED FLAGS: None   WEIGHT BEARING RESTRICTIONS: No  FALLS:  Has patient fallen in last 6 months? 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  DIAGNOSTIC FINDINGS:  Rt shoulder MRI in 2019 showing Rt UT prominence but no explanation for palpable abnormality. (Pt reports his wife noticed his shoulder blade looked like it was popping out but it was not painful at that time)   PATIENT SURVEYS :  Quick Dash:  QUICK DASH  Please rate your ability do the following activities in the last week by selecting the number below the appropriate response.   Activities Rating  Open a tight or new jar.  1 = No difficulty   Do heavy household chores (e.g., wash walls, floors). 1 = No difficulty   Carry a shopping bag or briefcase 1 = No difficulty   Wash your back. 1 = No difficulty   Use a knife to cut food. 1 = No difficulty   Recreational activities in which you take some force or impact through your arm, shoulder or hand (e.g., golf, hammering, tennis, etc.). 2 = Mild difficulty  During the past week, to what extent has your arm, shoulder or hand problem interfered with your normal social activities with family, friends, neighbors or groups?  1 = Not at all  During the past week, were you limited in your work or other regular daily activities as a result of your arm,  shoulder or hand problem? 1 = Not limited at all  Rate the severity of the following symptoms in the last week: Arm, Shoulder, or hand pain. 4 = Severe  Rate the severity of the following symptoms in the last week: Tingling (pins and needles) in your arm, shoulder or hand. 1 = none  During the past week, how much difficulty have you had sleeping because of the pain in your arm, shoulder or hand?  2 = Mild  difficulty   (A QuickDASH score may not be calculated if there is greater than 1 missing item.)  Quick Dash Disability/Symptom Score: [(sum of 16 (n) responses/11 (n)] x 25 = 36  Minimally Clinically Important Difference (MCID): 15-20 points  (Franchignoni, F. et al. (2013). Minimally clinically important difference of the disabilities of the arm, shoulder, and hand outcome measures (DASH) and its shortened version (Quick DASH). Journal of Orthopaedic & Sports Physical Therapy, 44(1), 30-39)   COGNITION: Overall cognitive status: Within functional limits for tasks assessed     SENSATION: WFL  POSTURE: In standing: stands with arms behind torso, appearance of possible scoliosis but pt is not aware of this, Rt scapula more protracted and upwardly rotated compared to the Lt.    UPPER EXTREMITY ROM:  All ROM WNL without pain: scapular dyskinesia noted with active flexion and abduction   UPPER EXTREMITY MMT:  MMT Right eval Left eval  Shoulder flexion 5 5  Shoulder extension 5 5  Shoulder abduction 5 pn at rhomboid 5  Shoulder adduction    Shoulder internal rotation 5 5  Shoulder external rotation 5 5  Middle trapezius 4+ 4+  Lower trapezius 4 4  Elbow flexion 5 5  Elbow extension 5 5  Wrist flexion    Wrist extension    Wrist ulnar deviation    Wrist radial deviation    Wrist pronation    Wrist supination    Grip strength (lbs)    (Blank rows = not tested)  SHOULDER SPECIAL TESTS: All noted negative at MD visit yesterday.   JOINT MOBILITY TESTING:  Rt shoulder more  elevated in supine   PALPATION:  No ttp in the Rt arm, +1 ttp to the Rt rhomboid and under the medial border of the mid scapula  Pain with CPA of T4, T3, T2, C7 Pain in the neck prone with rotation to the Left but not Rt                                                                                                                             TREATMENT DATE:  11/07/23: Thoracic extension with ball hands behind neck 10x Pt given new green band at his request Review of upper trap and levator scap stretching Standing at the wall open book 10x (Added to HEP- see below) Seated open book (Added to HEP- see below) Sidelying open book 10x each way (Added to HEP- see below) Trigger Point Dry Needling Subsequent Treatment: Pt instructed on Dry Needling rational, procedures, and possible side effects. Pt instructed to expect mild to moderate muscle soreness later in the day and/or into the next day.  Pt instructed in methods to reduce muscle soreness. Pt instructed to continue prescribed HEP. Because Dry Needling was performed over or adjacent to a lung field, pt was educated on S/S of pneumothorax and to seek immediate medical attention should they occur.  Patient was educated on signs and symptoms of infection and  other risk factors and advised to seek medical attention should they occur.  Patient verbalized understanding of these instructions and education.  Patient Verbal Consent Given: Yes Education Handout Provided: Yes Muscles Treated: Bil cervical multifidi, bil upper traps, right levator scap; right infraspinatus Electrical Stimulation Performed: No Treatment Response/Outcome: Utilized skilled palpation to identify bony landmarks and trigger points.  Able to illicit twitch response and muscle elongation.      10/24/23: Reprint of HEP per patient request Thoracic extension with ball hands behind neck 10x Standing rows green band x 15 - VC for proper form and to keep shoulders from hiking  up when pulling the band back  Standing bil shoulder extension green band 15x (Added to HEP- see below) pt given green band for home increased resistance Levator scap stretch with review for technique Trigger Point Dry Needling Subsequent Treatment: Pt instructed on Dry Needling rational, procedures, and possible side effects. Pt instructed to expect mild to moderate muscle soreness later in the day and/or into the next day.  Pt instructed in methods to reduce muscle soreness. Pt instructed to continue prescribed HEP. Because Dry Needling was performed over or adjacent to a lung field, pt was educated on S/S of pneumothorax and to seek immediate medical attention should they occur.  Patient was educated on signs and symptoms of infection and other risk factors and advised to seek medical attention should they occur.  Patient verbalized understanding of these instructions and education.  Patient Verbal Consent Given: Yes Education Handout Provided: Yes Muscles Treated: Bil cervical multifidi, bil upper traps, bil levator scap; right suprapinatus, right infraspinatus Electrical Stimulation Performed: No Treatment Response/Outcome: Utilized skilled palpation to identify bony landmarks and trigger points.  Able to illicit twitch response and muscle elongation.       10/16/23  Manual therapy:  Trigger Point Dry Needling  Initial Treatment: Pt instructed on Dry Needling rational, procedures, and possible side effects. Pt instructed to expect mild to moderate muscle soreness later in the day and/or into the next day.  Pt instructed in methods to reduce muscle soreness. Pt instructed to continue prescribed HEP. Because Dry Needling was performed over or adjacent to a lung field, pt was educated on S/S of pneumothorax and to seek immediate medical attention should they occur.  Patient was educated on signs and symptoms of infection and other risk factors and advised to seek medical attention should  they occur.  Patient verbalized understanding of these instructions and education.   Patient Verbal Consent Given: Yes Education Handout Provided: Yes Muscles Treated: Rt thoracic multifidi, Rt rhomboids, Rt subscapularis  Electrical Stimulation Performed: No Treatment Response/Outcome: Utilized skilled palpation to identify bony landmarks and trigger points.  Able to illicit twitch response and muscle elongation.      Scapular mobilizations - upward rotation, downward rotation, retraction  STM to rhomboids and scapular borders Thoracic mobs - CPA & UPA Grade III-IV T2 - T8 Therapeutic Exercise:  UBE 2'/2' each direction  Scapular retractions 2 x 10 Standing rows RTB x 15 - VC for proper form and to keep shoulders from hiking up when pulling the band back  Pallof pres RTB x 10 bil - VC to keep shoulders from coming up throughout D2 extension RTB x 10 bil - VC to only move through available motion while keeping proper form   10/13/23 Manual therapy:  Scapular mobilizations - upward rotation, downward rotation, retraction  STM to rhomboids and scapular borders Thoracic mobs - CPA & UPA Grade III-IV T2 -  T8 Therapeutic Exercise:  UBE 2'/2' each direction  R LS stretch 3 x 20  Seated thoracic extension with half foam roller 1 x 5 with 10 hold with vcs to decrease excessive neck extension due to pain here Scapular retractions 2 x 10 Standing rows RTB x 15 Education on DL squats and correct form and added to HEP x 10 Therapeutic Activity:  Educated pt on various techniques, including stand pivot transfers and slide board transfers for transferring his wife to/from the bed to the [facility] and had him practice multiple times Educated pt on purpose of a slideboard and stander transfer device  PATIENT EDUCATION: Education details: per today's note Person educated: Patient Education method: Explanation Education comprehension: verbalized understanding  HOME EXERCISE PROGRAM: Access Code:  [email or web address] URL: [email or web address] Date: 11/07/2023 Prepared by: Glade Pesa  Exercises - Seated Scapular Retraction  - 2-3 x daily - 7 x weekly - 1 sets - 10 reps - 2-3 hold - Standing Row with Anchored Resistance  - 1 x daily - 3-4 x weekly - 1-3 sets - 10 reps - 2-3 second hold - Shoulder extension with resistance - Neutral  - 1 x daily - 7 x weekly - 2 sets - 10 reps - Standing Shoulder Posterior Capsule Stretch  - 2-3 x daily - 7 x weekly - 1 sets - 1-3 reps - 20-30 seconds hold - Squat  - 1-2 x daily - 7 x weekly - 1-3 sets - 10 reps - no hold - Seated Thoracic Lumbar Extension with Pectoralis Stretch  - 2-3 x daily - 7 x weekly - 1 sets - 5-10 reps - 10 sec hold - Seated Cervical Sidebending Stretch  - 1 x daily - 7 x weekly - 1 sets - 3 reps - 30 hold - Seated Levator Scapulae Stretch  - 2-3 x daily - 7 x weekly - 1 sets - 1-3 reps - 20-30 seconds hold - Standing Thoracic Open Book at Wall  - 1 x daily - 7 x weekly - 1 sets - 10 reps - Seated Thoracic Rotation: Open Book  - 1 x daily - 7 x weekly - 1 sets - 10 reps - Sidelying Open Book Thoracic Rotation with Knee on Foam Roll  - 1 x daily - 7 x weekly - 1 sets - 10 reps  ASSESSMENT:  CLINICAL IMPRESSION: The patient has had to cancel some appts and reports limited compliance with HEP secondary to his wife being gravely ill.  We prioritized his ex's and included options with limited equipment and easier positions for more regular performance.  Although he is very sensitive to DN, he feels it has been helpful with neck and upper trap region pain.  Therapist monitoring response to all interventions and modifying treatment accordingly.    OBJECTIVE IMPAIRMENTS: decreased activity tolerance, decreased knowledge of condition, impaired flexibility, impaired UE functional use, and postural dysfunction.   ACTIVITY LIMITATIONS: lifting  PARTICIPATION LIMITATIONS: none  PERSONAL FACTORS: Time since onset of  injury/illness/exacerbation are also affecting patient's functional outcome.   REHAB POTENTIAL: Good  CLINICAL DECISION MAKING: Stable/uncomplicated  EVALUATION COMPLEXITY: Low  GOALS: Goals reviewed with patient? Yes  SHORT TERM GOALS: Target date: 10/18/23  Pt will be educated on proper lifting and transferring posture to minimize arm pain  Baseline: Goal status: met 10/21   LONG TERM GOALS: Target date: 11/16/23  Pt will have no increased rhomboid pain when sitting in a chair  Baseline:  Goal status: ongoing  2.  Pt  will have no pain in the Rt upper arm when moving his wife around in bed  Baseline:  Goal status: ongoing  3.  Pt will be ind with final HEP for spinal mobility and scapular strengthening  Baseline:  Goal status: ongoing   PLAN: PT FREQUENCY: 2x/week  PT DURATION: 6 weeks  PLANNED INTERVENTIONS: 97164- PT Re-evaluation, 97110-Therapeutic exercises, 97530- Therapeutic activity, 97112- Neuromuscular re-education, 97535- Self Care, [id number]- Manual therapy, [id number]- Electrical stimulation (unattended), 97035- Ultrasound, 02987- Traction (mechanical), 20560 (1-2 muscles), 20561 (3+ muscles)- Dry Needling, and Patient/Family education, ice, heat  PLAN FOR NEXT SESSION: ERO;  Quick DASH; assess response to dry needling, consider KT right shoulder; spinal mobility and general Rt postural strengthening, MT,  supine I's, T's and Y's to strengthen trapezius muscle   Glade Pesa, PT 11/07/23 3:33 PM Phone: [phone] Fax: [phone]

## 2023-11-09 ENCOUNTER — Encounter: Admitting: Physical Therapy

## 2023-11-13 ENCOUNTER — Ambulatory Visit

## 2023-11-13 DIAGNOSIS — R252 Cramp and spasm: Secondary | ICD-10-CM

## 2023-11-13 DIAGNOSIS — M79601 Pain in right arm: Secondary | ICD-10-CM

## 2023-11-13 DIAGNOSIS — R293 Abnormal posture: Secondary | ICD-10-CM

## 2023-11-13 NOTE — Therapy (Signed)
 OUTPATIENT PHYSICAL THERAPY UPPER EXTREMITY  Patient Name: James [name] MRN: [id number] DOB:Nov 23, 1955, 68 y.o., male Today's Date: 11/13/2023  END OF SESSION:  PT End of Session - 11/13/23 1508     Visit Number 7    Date for Recertification  12/25/23    Authorization Type Medicare    Authorization Time Period ABN for DN signed on 10/7 for Oct    Progress Note Due on Visit 10    PT Start Time 1451    PT Stop Time 1529    PT Time Calculation (min) 38 min    Activity Tolerance Patient tolerated treatment well    Behavior During Therapy [facility] for tasks assessed/performed              Past Medical History:  Diagnosis Date   Anemia    Aortic atherosclerosis    Barrett's esophagus    Cancer (HCC)    skin   Colitis    Degenerative disc disease    Diverticulosis 2013   Colonoscopy   GERD (gastroesophageal reflux disease)    Hiatal hernia    Hx of colonic polyps 2013   Colonoscopy- tubular adenoma, and hyperplastic    Hyperlipidemia    Internal hemorrhoids 2013   Colonoscopy   Lower back pain    Right inguinal hernia    Sleep apnea 2015   on CPAP   Tubular adenoma    Past Surgical History:  Procedure Laterality Date   COLONOSCOPY     HERNIA REPAIR  1980   left inguinal   KNEE ARTHROSCOPY  2018   TOTAL HIP ARTHROPLASTY Right 03/14/2014   Procedure: RIGHT TOTAL HIP ARTHROPLASTY ANTERIOR APPROACH ;  Surgeon: Lonni CINDERELLA Poli, MD;  Location: [facility];  Service: Orthopedics;  Laterality: Right;   TOTAL HIP ARTHROPLASTY Left 04/30/2021   Procedure: LEFT TOTAL HIP ARTHROPLASTY ANTERIOR APPROACH;  Surgeon: Poli Lonni CINDERELLA, MD;  Location: [facility];  Service: Orthopedics;  Laterality: Left;   WISDOM TOOTH EXTRACTION     Patient Active Problem List   Diagnosis Date Noted   Right arm pain 10/03/2023   Trigger point 10/03/2023   Centrilobular emphysema (HCC) 02/15/2022   Status post left hip replacement 04/30/2021   Unilateral primary osteoarthritis, left  hip 04/29/2021   CSR (central serous retinopathy) 03/28/2019   Status post arthroscopic knee surgery 08/09/2017   Unilateral primary osteoarthritis, left knee 04/10/2017   Status post arthroscopy of left knee 03/23/2017   Barrett's esophagus 05/30/2016   OSA (obstructive sleep apnea) 12/23/2015   Exertional dyspnea 12/23/2015   Status post total replacement of right hip 03/14/2014   Osteoarthritis of right hip 03/04/2014   Allergic rhinitis 03/04/2014   History of adenomatous polyp of colon 08/19/2011   Left sided colitis (HCC) 08/19/2011   GE reflux 11/14/2010   Low back pain 11/14/2010   History of smoking 11/14/2010   Hyperlipidemia 11/14/2010    PCP: Ronal Hailstone, MD  REFERRING PROVIDER: Raymond de Cuba, MD  REFERRING DIAG:  Diagnosis  M79.601 (ICD-10-CM) - Right arm pain  M79.10 (ICD-10-CM) - Trigger point    THERAPY DIAG:  Right arm pain - Plan: PT plan of care cert/re-cert  Abnormal posture - Plan: PT plan of care cert/re-cert  Cramp and spasm - Plan: PT plan of care cert/re-cert  Rationale for Evaluation and Treatment: Rehabilitation  ONSET DATE: 6 months ago   SUBJECTIVE:  SUBJECTIVE STATEMENT: PT has helped me.  My neck and shoulder areas have been looser from the dry needling.  Reaching back causes increased pain.  I have been transferring from the other side so I haven't done the motion that really aggravates my shoulder.  I definitely feel better when I am more consistent with my exercises. I need to make more time to do them.    EVAL: Ongoing Rt arm and shoulder blade area pain x almost 6 months.  The pain in the arm happens with transferring my wife or lifting something heavy.   I also have pain in the scapula/shoulder blade that feels like a spot I can touch.  I am staying in the  rehab facility with my wife currently.  It can hurt even if I just hold my mug in that hand for awhile.   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  She is currently in rehab.   PAIN: 11/13/23 Are you having pain? Yes: NPRS scale: 0-5/10  Pain location: mid bicep belly only with lift the chuck a certain way, neck and post shoulder  Pain description: a sharp pain  Aggravating factors: using the arm and lifting  Relieving factors: bengay    PRECAUTIONS: None  RED FLAGS: None   WEIGHT BEARING RESTRICTIONS: No  FALLS:  Has patient fallen in last 6 months? 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 Does not do any lifting at work   PLOF: Independent  PATIENT GOALS: decrease the arm and shoulder blade pain   NEXT MD VISIT: 3 months   OBJECTIVE:  Note: Objective measures were completed at Evaluation unless otherwise noted.  DIAGNOSTIC FINDINGS:  Rt shoulder MRI in 2019 showing Rt UT prominence but no explanation for palpable abnormality. (Pt reports his wife noticed his shoulder blade looked like it was popping out but it was not painful at that time)   PATIENT SURVEYS :  Quick Dash:  QUICK DASH  Please rate your ability do the following activities in the last week by selecting the number below the appropriate response.   Activities Rating  Open a tight or new jar.  1 = No difficulty   Do heavy household chores (e.g., wash walls, floors). 1 = No difficulty   Carry a shopping bag or briefcase 1 = No difficulty   Wash your back. 1 = No difficulty   Use a knife to cut food. 1 = No difficulty   Recreational activities in which you take some force or impact through your arm, shoulder or hand (e.g., golf, hammering, tennis, etc.). 2 = Mild difficulty  During the past week, to what extent has your arm,  shoulder or hand problem interfered with your normal social activities with family, friends, neighbors or groups?  1 = Not at all  During the past week, were you limited in your work or other regular daily activities as a result of your arm, shoulder or hand problem? 1 = Not limited at all  Rate the severity of the following symptoms in the last week: Arm, Shoulder, or hand pain. 4 = Severe  Rate the severity of the following symptoms in the last week: Tingling (pins and needles) in your arm, shoulder or hand. 1 = none  During the past week, how much difficulty have you had sleeping because of the pain in  your arm, shoulder or hand?  2 = Mild difficulty   (A QuickDASH score may not be calculated if there is greater than 1 missing item.)  Quick Dash Disability/Symptom Score: [(sum of 16 (n) responses/11 (n)] x 25 = 36  Minimally Clinically Important Difference (MCID): 15-20 points  (Franchignoni, F. et al. (2013). Minimally clinically important difference of the disabilities of the arm, shoulder, and hand outcome measures (DASH) and its shortened version (Quick DASH). Journal of Orthopaedic & Sports Physical Therapy, 44(1), 30-39)   11/12/25: QuickDASH: 13.6%  COGNITION: Overall cognitive status: Within functional limits for tasks assessed     SENSATION: WFL  POSTURE: In standing: stands with arms behind torso, appearance of possible scoliosis but pt is not aware of this, Rt scapula more protracted and upwardly rotated compared to the Lt.    UPPER EXTREMITY ROM:  All ROM WNL without pain: scapular dyskinesia noted with active flexion and abduction   UPPER EXTREMITY MMT:  MMT Right eval Left eval  Shoulder flexion 5 5  Shoulder extension 5 5  Shoulder abduction 5 pn at rhomboid 5  Shoulder adduction    Shoulder internal rotation 5 5  Shoulder external rotation 5 5  Middle trapezius 4+ 4+  Lower trapezius 4 4  Elbow flexion 5 5  Elbow extension 5 5  Wrist flexion    Wrist  extension    Wrist ulnar deviation    Wrist radial deviation    Wrist pronation    Wrist supination    Grip strength (lbs)    (Blank rows = not tested)  SHOULDER SPECIAL TESTS: All noted negative at MD visit yesterday.   JOINT MOBILITY TESTING:  Rt shoulder more elevated in supine   PALPATION:  No ttp in the Rt arm, +1 ttp to the Rt rhomboid and under the medial border of the mid scapula  Pain with CPA of T4, T3, T2, C7 Pain in the neck prone with rotation to the Left but not Rt                                                                                                                             TREATMENT DATE:  11/13/23: Arm bike: level 1.5 x 6 minutes (3/3)-PT present to discuss progress.  Standing shoulder extension and rows: 2x10 bil with green band  Standing at the wall open book 10x  Seated open book x10  Trigger Point Dry Needling Subsequent Treatment: Pt instructed on Dry Needling rational, procedures, and possible side effects. Pt instructed to expect mild to moderate muscle soreness later in the day and/or into the next day.  Pt instructed in methods to reduce muscle soreness. Pt instructed to continue prescribed HEP. Because Dry Needling was performed over or adjacent to a lung field, pt was educated on S/S of pneumothorax and to seek immediate medical attention should they occur.  Patient was educated on signs and symptoms of infection and other risk factors and advised  to seek medical attention should they occur.  Patient verbalized understanding of these instructions and education.  Patient Verbal Consent Given: Yes Education Handout Provided: Yes Muscles Treated: Bil cervical multifidi, bil upper traps, right levator scap; right infraspinatus, Rt posterior deltoid Electrical Stimulation Performed: No Treatment Response/Outcome: Utilized skilled palpation to identify bony landmarks and trigger points.  Able to illicit twitch response and muscle elongation.         11/07/23: Thoracic extension with ball hands behind neck 10x Pt given new green band at his request Review of upper trap and levator scap stretching Standing at the wall open book 10x (Added to HEP- see below) Seated open book (Added to HEP- see below) Sidelying open book 10x each way (Added to HEP- see below) Trigger Point Dry Needling Subsequent Treatment: Pt instructed on Dry Needling rational, procedures, and possible side effects. Pt instructed to expect mild to moderate muscle soreness later in the day and/or into the next day.  Pt instructed in methods to reduce muscle soreness. Pt instructed to continue prescribed HEP. Because Dry Needling was performed over or adjacent to a lung field, pt was educated on S/S of pneumothorax and to seek immediate medical attention should they occur.  Patient was educated on signs and symptoms of infection and other risk factors and advised to seek medical attention should they occur.  Patient verbalized understanding of these instructions and education.  Patient Verbal Consent Given: Yes Education Handout Provided: Yes Muscles Treated: Bil cervical multifidi, bil upper traps, right levator scap; right infraspinatus Electrical Stimulation Performed: No Treatment Response/Outcome: Utilized skilled palpation to identify bony landmarks and trigger points.  Able to illicit twitch response and muscle elongation.      10/24/23: Reprint of HEP per patient request Thoracic extension with ball hands behind neck 10x Standing rows green band x 15 - VC for proper form and to keep shoulders from hiking up when pulling the band back  Standing bil shoulder extension green band 15x (Added to HEP- see below) pt given green band for home increased resistance Levator scap stretch with review for technique Trigger Point Dry Needling Subsequent Treatment: Pt instructed on Dry Needling rational, procedures, and possible side effects. Pt instructed to expect mild  to moderate muscle soreness later in the day and/or into the next day.  Pt instructed in methods to reduce muscle soreness. Pt instructed to continue prescribed HEP. Because Dry Needling was performed over or adjacent to a lung field, pt was educated on S/S of pneumothorax and to seek immediate medical attention should they occur.  Patient was educated on signs and symptoms of infection and other risk factors and advised to seek medical attention should they occur.  Patient verbalized understanding of these instructions and education.  Patient Verbal Consent Given: Yes Education Handout Provided: Yes Muscles Treated: Bil cervical multifidi, bil upper traps, bil levator scap; right suprapinatus, right infraspinatus Electrical Stimulation Performed: No Treatment Response/Outcome: Utilized skilled palpation to identify bony landmarks and trigger points.  Able to illicit twitch response and muscle elongation.       PATIENT EDUCATION: Education details: per today's note Person educated: Patient Education method: Explanation Education comprehension: verbalized understanding  HOME EXERCISE PROGRAM: Access Code: [email or web address] URL: [email or web address] Date: 11/07/2023 Prepared by: Glade Pesa  Exercises - Seated Scapular Retraction  - 2-3 x daily - 7 x weekly - 1 sets - 10 reps - 2-3 hold - Standing Row with Anchored Resistance  - 1 x daily - 3-4 x weekly - 1-3  sets - 10 reps - 2-3 second hold - Shoulder extension with resistance - Neutral  - 1 x daily - 7 x weekly - 2 sets - 10 reps - Standing Shoulder Posterior Capsule Stretch  - 2-3 x daily - 7 x weekly - 1 sets - 1-3 reps - 20-30 seconds hold - Squat  - 1-2 x daily - 7 x weekly - 1-3 sets - 10 reps - no hold - Seated Thoracic Lumbar Extension with Pectoralis Stretch  - 2-3 x daily - 7 x weekly - 1 sets - 5-10 reps - 10 sec hold - Seated Cervical Sidebending Stretch  - 1 x daily - 7 x weekly - 1 sets - 3 reps - 30 hold -  Seated Levator Scapulae Stretch  - 2-3 x daily - 7 x weekly - 1 sets - 1-3 reps - 20-30 seconds hold - Standing Thoracic Open Book at Wall  - 1 x daily - 7 x weekly - 1 sets - 10 reps - Seated Thoracic Rotation: Open Book  - 1 x daily - 7 x weekly - 1 sets - 10 reps - Sidelying Open Book Thoracic Rotation with Knee on Foam Roll  - 1 x daily - 7 x weekly - 1 sets - 10 reps  ASSESSMENT:  CLINICAL IMPRESSION: PT has been modifying his overall activity to reduce pain and strain on his arm.  He is now transferring his wife from the opposite side of the bed and this no longer causes pain when he does this motion.  He has not tried if from the opposite side yet. He has intermittent pain with reaching out and back.  He has not been able to be consistent with HEP due to caring for his wife.  He does report that when he is more consistent, he has fewer symptoms.   He has reduced pain in the rhomboid area with sitting and is not able to rate overall reduction in the pain. Good response to dry needling with improved tissue mobility and twitch response. Improved spinal mobility noted with thoracic rotation exercises today.  Therapist monitoring response to all interventions and modifying treatment accordingly.    OBJECTIVE IMPAIRMENTS: decreased activity tolerance, decreased knowledge of condition, impaired flexibility, impaired UE functional use, and postural dysfunction.   ACTIVITY LIMITATIONS: lifting  PARTICIPATION LIMITATIONS: none  PERSONAL FACTORS: Time since onset of injury/illness/exacerbation are also affecting patient's functional outcome.   REHAB POTENTIAL: Good  CLINICAL DECISION MAKING: Stable/uncomplicated  EVALUATION COMPLEXITY: Low  GOALS: Goals reviewed with patient? Yes  SHORT TERM GOALS: Target date: 10/18/23  Pt will be educated on proper lifting and transferring posture to minimize arm pain  Baseline: Goal status: met 10/21   LONG TERM GOALS: Target date: 12/25/2023    Pt  will have no increased rhomboid pain when sitting in a chair  Baseline:  not sitting as often, soreness when he does sit and uses pillow (11/13/23) Goal status: ongoing  2.  Pt will have no pain in the Rt upper arm when moving his wife around in bed  Baseline: modifying this to the other side Goal status: ongoing  3.  Pt will be ind with final HEP for spinal mobility and scapular strengthening  Baseline:  intermittently consistent due to care of wife (11/13/23) Goal status: ongoing    4. Improve QuickDASH to < or = to 8% disability    Baseline: 13.6%    Goal status: NEW PLAN: PT FREQUENCY: 1x/week  PT DURATION: 6 weeks  PLANNED INTERVENTIONS: 97164- PT Re-evaluation, 97110-Therapeutic exercises, 97530- Therapeutic activity, [id number]- Neuromuscular re-education, 97535- Self Care, [id number]- Manual therapy, [id number]- Electrical stimulation (unattended), 97035- Ultrasound, 02987- Traction (mechanical), 20560 (1-2 muscles), 20561 (3+ muscles)- Dry Needling, and Patient/Family education, ice, heat  PLAN FOR NEXT SESSION:  assess response to dry needling, consider KT right shoulder; spinal mobility and general Rt postural strengthening, MT,  supine I's, T's and Y's to strengthen trapezius muscle   Burnard Joy, PT 11/13/23 3:32 PM

## 2023-11-15 ENCOUNTER — Encounter: Admitting: Physical Therapy

## 2023-11-20 ENCOUNTER — Encounter: Payer: Self-pay | Admitting: Radiology

## 2023-11-21 ENCOUNTER — Encounter: Admitting: Physical Therapy

## 2023-11-23 ENCOUNTER — Encounter: Admitting: Physical Therapy

## 2023-11-29 ENCOUNTER — Ambulatory Visit

## 2023-12-05 ENCOUNTER — Ambulatory Visit: Admitting: Physical Therapy

## 2023-12-07 ENCOUNTER — Ambulatory Visit (HOSPITAL_BASED_OUTPATIENT_CLINIC_OR_DEPARTMENT_OTHER): Admitting: Pulmonary Disease

## 2023-12-12 ENCOUNTER — Ambulatory Visit: Admitting: Physical Therapy

## 2023-12-14 DIAGNOSIS — R002 Palpitations: Secondary | ICD-10-CM | POA: Diagnosis not present

## 2023-12-15 ENCOUNTER — Ambulatory Visit: Payer: Self-pay | Admitting: Internal Medicine

## 2023-12-20 ENCOUNTER — Ambulatory Visit: Admitting: Physical Therapy

## 2023-12-21 ENCOUNTER — Other Ambulatory Visit: Payer: Self-pay | Admitting: Internal Medicine

## 2023-12-25 ENCOUNTER — Other Ambulatory Visit: Payer: Self-pay

## 2023-12-25 MED ORDER — AZITHROMYCIN 250 MG PO TABS: ORAL_TABLET | ORAL | 0 refills | Status: AC

## 2023-12-29 ENCOUNTER — Ambulatory Visit: Admitting: Gastroenterology

## 2024-01-02 ENCOUNTER — Ambulatory Visit (HOSPITAL_BASED_OUTPATIENT_CLINIC_OR_DEPARTMENT_OTHER): Admitting: Family Medicine

## 2024-01-10 ENCOUNTER — Other Ambulatory Visit: Payer: Self-pay | Admitting: Internal Medicine

## 2024-02-04 NOTE — Progress Notes (Unsigned)
 "    02/04/2024 James [name] [id number] [birth year]   Primary Gastroenterologist: Dr. 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   Discussed the use of AI scribe software for clinical note transcription with the patient, who gave verbal consent to proceed.  History of Present Illness James [name] is a 69 year old male with Barrett's esophagus and ulcerative colitis who presents for evaluation of intermittent left lower quadrant abdominal pain.  Intermittent, sharp pain occurs in the left lower abdomen, just above the site of prior hernia repair. Episodes arise every few days to a week and a half, with the most recent lasting 6-8 hours two days ago. Pain can be severe enough to cause him to double over, especially with pressure or accidental contact. Occasionally, the area becomes hard, and pressing on it produces a squeak sensation, as if gas is being pushed through. No clear dietary triggers identified. Reports a 15-pound weight loss recently.  History includes left inguinal hernia repair (date unclear), right inguinal hernia repair in 1980, left hip surgery in 2023, and right hip surgery in 2016.   Ulcerative colitis managed with Lialda  (mesalamine ) 4 tablets daily and nightly Canasa  suppository Q HS. Missing the suppository for 2-3 days leads to increased gas and decreased bowel control. Bowel movements are mostly daily, sometimes formed but often disintegrate when flushed, with occasional skipped days. Uses ibuprofen  very infrequently.  Barrett's esophagus managed with pantoprazole  40 mg daily. No new or worsening reflux symptoms, heartburn, burping,  or dysphagia.  The patient reports a 15-pound weight loss.  He is grieving the loss of his wife who passed away early 02-11-2024.  His most recent EGD and colonoscopy were completed 03/15/2021.  The EGD showed short segment Barrett's esophagus and a 2 cm hiatal hernia.  The colonoscopy identified indeterminate colitis, proctitis, one 4 mm sessile serrated polyp removed from the cecum, one 6 mm hyperplastic polyp removed from the rectum and diverticulosis.  Biopsies of the ascending colon, cecum and transverse colon were negative for active inflammation or chronic changes.  On exam at this time, his heart rhythm is irregular with controlled rate.  He is asymptomatic. He endorsed undergoing a three-day home cardiac monitor 2-3 months ago showing no significant arrhythmias. Occasionally experiences palpitations and has had infrequent, sometimes extreme, dizzy spells in the past.   Cardiac monitor  Long term ( 3 - 7 days) 12/13/2023: Preliminary Findings Prepared by Elspeth Fridge, CCT 12/13/23 Patient had a min HR of 45 bpm, max HR of 145 bpm, and avg HR of 72 bpm. Predominant underlying rhythm was Sinus Rhythm. Slight P wave morphology changes were noted. Bundle Branch Block/IVCD was present. 1 run of Ventricular Tachycardia occurred lasting 4 beats with a max rate of 145 bpm (avg 113 bpm). 1 run of Supraventricular Tachycardia occurred lasting 7 beats with a max rate of 141 bpm (avg 136 bpm). Isolated SVEs were occasional (1.2%, 4964), SVE Couplets were rare (<1.0%, 23), and SVE Triplets were rare (<1.0%, 3). Isolated VEs were occasional (2.9%, 11515), VE Couplets were rare (<1.0%, 516), and VE Triplets were rare (<1.0%, 16). Ventricular Bigeminy and Trigeminy were present.     Latest Ref Rng & Units 10/10/2023  11:02 AM 03/16/2023   10:56 AM 10/04/2022   11:36 AM  CBC  WBC 3.8 - 10.8 Thousand/uL 4.9  5.3  4.6   Hemoglobin 13.2 - 17.1 g/dL 87.0  86.1  85.8   Hematocrit 38.5 - 50.0 % 38.6  41.6   42.6   Platelets 140 - 400 Thousand/uL 251  264.0  253        Latest Ref Rng & Units 10/10/2023   11:02 AM 03/16/2023   10:56 AM 10/04/2022   11:36 AM  CMP  Glucose 65 - 99 mg/dL 898  891  96   BUN 7 - 25 mg/dL 15  20  12    Creatinine 0.70 - 1.35 mg/dL 9.19  8.99  9.13   Sodium 135 - 146 mmol/L 140  140  141   Potassium 3.5 - 5.3 mmol/L 4.7  5.1  5.6   Chloride 98 - 110 mmol/L 107  105  106   CO2 20 - 32 mmol/L 27  27  28    Calcium  8.6 - 10.3 mg/dL 9.4  89.9  89.5   Total Protein 6.1 - 8.1 g/dL 6.5  7.7  7.5   Total Bilirubin 0.2 - 1.2 mg/dL 0.4  0.6  0.6   Alkaline Phos 39 - 117 U/L  51    AST 10 - 35 U/L 26  24  24    ALT 9 - 46 U/L 21  16  17      Past Medical History:  Diagnosis Date   Anemia    Aortic atherosclerosis    Barrett's esophagus    Cancer (HCC)    skin   Colitis    Degenerative disc disease    Diverticulosis 2013   Colonoscopy   GERD (gastroesophageal reflux disease)    Hiatal hernia    Hx of colonic polyps 2013   Colonoscopy- tubular adenoma, and hyperplastic    Hyperlipidemia    Internal hemorrhoids 2013   Colonoscopy   Lower back pain    Right inguinal hernia    Sleep apnea 2015   on CPAP   Tubular adenoma    GI PROCEDURES:   EGD 03/15/2021: - Esophageal mucosal changes consistent with short-segment Barrett's esophagus. Biopsied.  - 2 cm hiatal hernia.  - Normal stomach.  - Normal examined duodenum. - Surveillance EGD 3 years  Colonoscopy 03/15/2021:  - The examined portion of the ileum was normal.  - Colitis, indeterminate. Active inflammation in distal rectum (proctitis). Inactive inflammation with pseudopolyps in the proximal sigmoid, descending colon and in the distal transverse colon. This was graded as Mayo Score 2 (moderate disease). Biopsied  - One 4 mm polyp in the cecum, removed with a cold snare. Resected and retrieved.  - One 6 mm polyp in the rectum, removed with a cold snare. Resected and retrieved.  - Diverticulosis in the  sigmoid colon, in the descending colon and in the ascending colon.  - Small internal hemorrhoids.  1. Surgical [P], esophagus at 40cm - INTESTINAL METAPLASIA CONSISTENT WITH BARRETT'S ESOPHAGUS. - NEGATIVE FOR DYSPLASIA. P 2. Surgical [P], esophagus at 39cm - INTESTINAL METAPLASIA CONSISTENT WITH BARRETT'S ESOPHAGUS. - NEGATIVE FOR DYSPLASIA. 3. Surgical [P], colon, cecum, polyp (1) - SESSILE SERRATED POLYP WITHOUT CYTOLOGIC DYSPLASIA. 4. Surgical [P], colon, ascending and cecum - UNREMARKABLE COLONIC MUCOSA. - NO ACTIVE INFLAMMATION OR CHRONIC CHANGES. - NEGATIVE FOR DYSPLASIA. 5. Surgical [P], colon, transverse - UNREMARKABLE COLONIC MUCOSA. - NO ACTIVE INFLAMMATION OR CHRONIC CHANGES. - NEGATIVE FOR DYSPLASIA. 6. Surgical [P], colon,  sigmoid and descending - UNREMARKABLE COLONIC MUCOSA. - NO ACTIVE INFLAMMATION OR CHRONIC CHANGES. - NEGATIVE FOR DYSPLASIA. 7. Surgical [P], colon, rectum - MODERATELY ACTIVE CHRONIC COLITIS. - NEGATIVE FOR DYSPLASIA. 8. Surgical [P], colon, rectum, polyp (1) - HYPERPLASTIC POLYP.    Past Surgical History:  Procedure Laterality Date   COLONOSCOPY     HERNIA REPAIR  1980   left inguinal   KNEE ARTHROSCOPY  2018   TOTAL HIP ARTHROPLASTY Right 03/14/2014   Procedure: RIGHT TOTAL HIP ARTHROPLASTY ANTERIOR APPROACH ;  Surgeon: Lonni CINDERELLA Poli, MD;  Location: [facility];  Service: Orthopedics;  Laterality: Right;   TOTAL HIP ARTHROPLASTY Left 04/30/2021   Procedure: LEFT TOTAL HIP ARTHROPLASTY ANTERIOR APPROACH;  Surgeon: Poli Lonni CINDERELLA, MD;  Location: [facility];  Service: Orthopedics;  Laterality: Left;   WISDOM TOOTH EXTRACTION     Medications Ordered Prior to Encounter[1]  Allergies[2]  Current Medications, Allergies, Past Medical History, Past Surgical History, Family History and Social History were reviewed in [employer] record.  Review of Systems:   Constitutional: See HPI.  Respiratory: Negative for shortness of breath.    Cardiovascular: Negative for chest pain, palpitations and leg swelling.  Gastrointestinal: See HPI.  Musculoskeletal: Negative for back pain or muscle aches.  Neurological: Negative for dizziness, headaches or paresthesias.   Physical Exam: BP 128/60   Ht 5' 11 (1.803 m)   Wt 179 lb 2 oz (81.3 kg)   BMI 24.98 kg/m   General: 69 year old male in no acute distress. Head: Normocephalic and atraumatic. Eyes: No scleral icterus. Conjunctiva pink . Ears: Normal auditory acuity. Mouth: Dentition intact. No ulcers or lesions.  Lungs: Clear throughout to auscultation. Heart: Regular rate and rhythm, no murmur. Abdomen: Soft, nondistended.  Left lower quadrant tenderness without rebound or guarding.  No masses or hepatomegaly. Normal bowel sounds x 4 quadrants.  Rectal: Deferred. Musculoskeletal: Symmetrical with no gross deformities. Extremities: No edema. Neurological: Alert oriented x 4. No focal deficits.  Psychological: Alert and cooperative. Normal mood and affect  Assessment and Recommendations:  69 year old male with Barrett's esophagus.  EGD 02/2021 confirmed short segment Barrett's esophagus without dysplasia and a 2 cm hiatal hernia. - Continue pantoprazole  40 mg once daily - Surveillance EGD benefits and risks discussed including risk with sedation, risk of bleeding, perforation and infection.To schedule after cardiac clearance required  Ulcerative colitis initially diagnosed in 2018 on oral and rectal mesalamine .  Colonoscopy 02/2021 identified indeterminate colitis, proctitis,  Biopsies of the ascending colon, cecum and transverse colon were negative for active inflammation or chronic changes. - Continue mesalamine  1.2 g 4 tabs p.o. daily - Continue mesalamine  1 g suppository PR nightly - Colonoscopy benefits and risks discussed including risk with sedation, risk of bleeding, perforation and infection  - Colonoscopy to be scheduled after cardiac clearance proceed - CBC, CMP  and CRP  History of colon polyps.  Colonoscopy 02/2021 identified one 4 mm sessile serrated polyp removed from the cecum and one 6 mm hyperplastic polyp removed from the rectum. - See plan above   LLQ pain, history of diverticulosis - Labs as ordered above -CTAP with oral and IV contrast  15 pound weight loss over the past few months, possibly secondary to grieving process (wife passed away last month)  Irregular heart rhythm on exam.  Recent cardiac monitor 12/13/2023 showed 7 beat nonsustained SVT and 4 beat nonsustained VT with occasional supraventricular ectopy and occasional ventricular ectopy without sustained arrhythmias and no evidence of atrial fibrillation.  No angina.  Prior episodes of dizziness, no recent episodes. - Cardiology referral.  Cardiac clearance required prior to scheduling EGD and colonoscopy.      [1]  Current Outpatient Medications on File Prior to Visit  Medication Sig Dispense Refill   ALPRAZolam  (XANAX ) 0.25 MG tablet TAKE 1 TABLET(0.25 MG) BY MOUTH TWICE DAILY AS NEEDED FOR ANXIETY 60 tablet 5   cholecalciferol  (VITAMIN D3) 25 MCG (1000 UNIT) tablet Take 1,000 Units by mouth daily.     ibuprofen  (ADVIL ) 800 MG tablet TAKE 1 TABLET(800 MG) BY MOUTH EVERY 8 HOURS AS NEEDED 60 tablet 1   mesalamine  (CANASA ) 1000 MG suppository UNWRAP AND INSERT 1 SUPPOSITORY(1000 MG) RECTALLY AT BEDTIME 90 suppository 1   mesalamine  (LIALDA ) 1.2 g EC tablet TAKE 4 TABLETS(4.8 GRAMS) BY MOUTH DAILY WITH BREAKFAST 360 tablet 1   Multiple Vitamin (MULTIVITAMIN) tablet Take 1 tablet by mouth daily.     oxymetazoline (AFRIN) 0.05 % nasal spray Place 4 sprays into both nostrils at bedtime.     pantoprazole  (PROTONIX ) 40 MG tablet TAKE 1 TABLET BY MOUTH EVERY DAY 90 tablet 3   rosuvastatin  (CRESTOR ) 5 MG tablet TAKE 1 TABLET(5 MG) BY MOUTH DAILY 90 tablet 3   No current facility-administered medications on file prior to visit.  [2]  Allergies Allergen Reactions   Prednisone      R/T pooling of blood behind the retina.    "

## 2024-02-05 ENCOUNTER — Encounter: Payer: Self-pay | Admitting: Nurse Practitioner

## 2024-02-05 ENCOUNTER — Other Ambulatory Visit

## 2024-02-05 ENCOUNTER — Ambulatory Visit: Admitting: Nurse Practitioner

## 2024-02-05 VITALS — BP 128/60 | Ht 71.0 in | Wt 179.1 lb

## 2024-02-05 DIAGNOSIS — R634 Abnormal weight loss: Secondary | ICD-10-CM | POA: Diagnosis not present

## 2024-02-05 DIAGNOSIS — I499 Cardiac arrhythmia, unspecified: Secondary | ICD-10-CM | POA: Diagnosis not present

## 2024-02-05 DIAGNOSIS — K51919 Ulcerative colitis, unspecified with unspecified complications: Secondary | ICD-10-CM

## 2024-02-05 DIAGNOSIS — R1032 Left lower quadrant pain: Secondary | ICD-10-CM | POA: Diagnosis not present

## 2024-02-05 DIAGNOSIS — Z01818 Encounter for other preprocedural examination: Secondary | ICD-10-CM

## 2024-02-05 DIAGNOSIS — Z860101 Personal history of adenomatous and serrated colon polyps: Secondary | ICD-10-CM | POA: Diagnosis not present

## 2024-02-05 LAB — CBC WITH DIFFERENTIAL/PLATELET
Basophils Absolute: 0 K/uL (ref 0.0–0.1)
Basophils Relative: 0.5 % (ref 0.0–3.0)
Eosinophils Absolute: 0.1 K/uL (ref 0.0–0.7)
Eosinophils Relative: 0.8 % (ref 0.0–5.0)
HCT: 40.1 % (ref 39.0–52.0)
Hemoglobin: 13.5 g/dL (ref 13.0–17.0)
Lymphocytes Relative: 23.6 % (ref 12.0–46.0)
Lymphs Abs: 1.6 K/uL (ref 0.7–4.0)
MCHC: 33.6 g/dL (ref 30.0–36.0)
MCV: 93.6 fl (ref 78.0–100.0)
Monocytes Absolute: 0.7 K/uL (ref 0.1–1.0)
Monocytes Relative: 9.7 % (ref 3.0–12.0)
Neutro Abs: 4.4 K/uL (ref 1.4–7.7)
Neutrophils Relative %: 65.4 % (ref 43.0–77.0)
Platelets: 261 K/uL (ref 150.0–400.0)
RBC: 4.29 Mil/uL (ref 4.22–5.81)
RDW: 13.2 % (ref 11.5–15.5)
WBC: 6.8 K/uL (ref 4.0–10.5)

## 2024-02-05 LAB — COMPREHENSIVE METABOLIC PANEL WITH GFR
ALT: 11 U/L (ref 3–53)
AST: 19 U/L (ref 5–37)
Albumin: 4.4 g/dL (ref 3.5–5.2)
Alkaline Phosphatase: 46 U/L (ref 39–117)
BUN: 20 mg/dL (ref 6–23)
CO2: 26 meq/L (ref 19–32)
Calcium: 9.9 mg/dL (ref 8.4–10.5)
Chloride: 104 meq/L (ref 96–112)
Creatinine, Ser: 0.82 mg/dL (ref 0.40–1.50)
GFR: 89.53 mL/min
Glucose, Bld: 87 mg/dL (ref 70–99)
Potassium: 4.2 meq/L (ref 3.5–5.1)
Sodium: 138 meq/L (ref 135–145)
Total Bilirubin: 0.5 mg/dL (ref 0.2–1.2)
Total Protein: 7.2 g/dL (ref 6.0–8.3)

## 2024-02-05 LAB — C-REACTIVE PROTEIN: CRP: <0.5 mg/dL — ABNORMAL LOW (ref 1.0–20.0)

## 2024-02-05 NOTE — Patient Instructions (Signed)
 _______________________________________________________  If your blood pressure at your visit was 140/90 or greater, please contact your primary care physician to follow up on this.  _______________________________________________________  If you are age 70 or older, your body mass index should be between 23-30. Your Body mass index is 24.98 kg/m. If this is out of the aforementioned range listed, please consider follow up with your Primary Care Provider.  If you are age 12 or younger, your body mass index should be between 19-25. Your Body mass index is 24.98 kg/m. If this is out of the aformentioned range listed, please consider follow up with your Primary Care Provider.   ________________________________________________________  The [facility] providers would like to encourage you to use MYCHART to communicate with providers for non-urgent requests or questions.  Due to long hold times on the telephone, sending your provider a message by [facility] may be a faster and more efficient way to get a response.  Please allow 48 business hours for a response.  Please remember that this is for non-urgent requests.  _______________________________________________________  [facility] is using a team-based approach to care.  Your team is made up of your doctor and two to three APPS. Our APPS (Nurse Practitioners and Physician Assistants) work with your physician to ensure care continuity for you. They are fully qualified to address your health concerns and develop a treatment plan. They communicate directly with your gastroenterologist to care for you. Seeing the Advanced Practice Practitioners on your physician's team can help you by facilitating care more promptly, often allowing for earlier appointments, access to diagnostic testing, procedures, and other specialty referrals.   Your provider has requested that you go to the basement level for lab work before leaving today. Press B on the  elevator. The lab is located at the first door on the left as you exit the elevator.  You have been scheduled for a CT scan of the abdomen and pelvis at [facility]. You are scheduled on 02-12-24 at 11am.  You should arrive at 8:45 am for registration and to drink contrast 9am and 10am.  You will be scanned at 11am.    You may take any medications as prescribed with a small amount of water , if necessary. If you take any of the following medications: METFORMIN, GLUCOPHAGE, GLUCOVANCE, AVANDAMET, RIOMET, FORTAMET, ACTOPLUS MET, JANUMET, GLUMETZA or METAGLIP, you MAY be asked to HOLD this medication 48 hours AFTER the exam.   The purpose of you drinking the oral contrast is to aid in the visualization of your intestinal tract. The contrast solution may cause some diarrhea. Depending on your individual set of symptoms, you may also receive an intravenous injection of x-ray contrast/dye. Plan on being at [facility] for 45 minutes or longer, depending on the type of exam you are having performed.   If you have any questions regarding your exam or if you need to reschedule, you may call [facility] at [phone] between the hours of 8:00 am and 5:00 pm, Monday-Friday.   We have referred you to Cardiology.  Someone should contact you with an appointment.  If you have not heard from their office in 1 to 2 weeks, please let our office know.  ___________________________________________________________________________  Due to recent changes in healthcare laws, you may see the results of your imaging and laboratory studies on MyChart before your provider has had a chance to review them.  We understand that in some cases there may be results that are confusing or concerning to you.  Not all laboratory results come back in the same time frame and the provider may be waiting for multiple results in order to interpret others.  Please give us  48 hours in order for your provider to thoroughly  review all the results before contacting the office for clarification of your results.   Thank you for entrusting me with your care and choosing [facility].  Elida Shawl, NP

## 2024-02-06 ENCOUNTER — Ambulatory Visit: Payer: Self-pay | Admitting: Nurse Practitioner

## 2024-02-08 ENCOUNTER — Ambulatory Visit (HOSPITAL_BASED_OUTPATIENT_CLINIC_OR_DEPARTMENT_OTHER): Admitting: Pulmonary Disease

## 2024-02-08 ENCOUNTER — Encounter (HOSPITAL_BASED_OUTPATIENT_CLINIC_OR_DEPARTMENT_OTHER): Payer: Self-pay | Admitting: Pulmonary Disease

## 2024-02-08 ENCOUNTER — Other Ambulatory Visit: Payer: Self-pay

## 2024-02-08 VITALS — BP 121/60 | HR 34 | Ht 71.0 in | Wt 178.0 lb

## 2024-02-08 DIAGNOSIS — I493 Ventricular premature depolarization: Secondary | ICD-10-CM

## 2024-02-08 DIAGNOSIS — Z87891 Personal history of nicotine dependence: Secondary | ICD-10-CM

## 2024-02-08 DIAGNOSIS — R001 Bradycardia, unspecified: Secondary | ICD-10-CM

## 2024-02-08 DIAGNOSIS — G4733 Obstructive sleep apnea (adult) (pediatric): Secondary | ICD-10-CM | POA: Diagnosis not present

## 2024-02-08 NOTE — Progress Notes (Signed)
 "  Subjective:    Patient ID: James [name], male    DOB: [birth year], 69 y.o.   MRN: [id number]  69  yo ex-smoker  for FU of OSA on CPAP He started smoking at age 51, smoked about a pack per day until he quit in 2016, more than 40 pack years.  His wife died in dec of metastatic lung cancer.    PMH - ulcerative colitis , rhinitis medicamentosa     Discussed the use of AI scribe software for clinical note transcription with the patient, who gave verbal consent to proceed.  History of Present Illness James [name] is a 69 year old male who presents for follow-up regarding his heart rate and CPAP use. He was referred by a PA at [facility] for a cardiology evaluation.  Heart rates from 45 to 145 bpm were recorded on a heart monitor in November. He occasionally feels flip flop skipped beats and has intermittent dizziness without a clear trigger, including an episode a few years ago when he had to hold onto a counter to avoid falling. A low heart rate was noted at a recent [facility] visit, leading to cardiology referral. He also noted transient heart pounding around the time of his wife's death that improved with reduced coffee intake.  He uses CPAP nightly for sleep apnea and believes it is beneficial. He averages more than 8 hours of use per night with no missed nights. He wakes with dry mouth and has occasional air leak through his mouth while using a nasal mask. He adjusted humidity to reduce water  buildup noise and otherwise tolerates CPAP well.    Significant tests/ events reviewed   LDCT chest 01/2022 mild emphysema, 2 mm right middle lobe nodule, coronary artery calcification HST 10/07/15 >> AHI 48     Review of Systems  neg for any significant sore throat, dysphagia, itching, sneezing, nasal congestion or excess/ purulent secretions, fever, chills, sweats, unintended wt loss, pleuritic or exertional cp, hempoptysis, orthopnea pnd or change in  chronic leg swelling. Also denies presyncope, palpitations, heartburn, abdominal pain, nausea, vomiting, diarrhea or change in bowel or urinary habits, dysuria,hematuria, rash, arthralgias, visual complaints, headache, numbness weakness or ataxia.      Objective:   Physical Exam  Gen. Pleasant, well-nourished, in no distress ENT - no thrush, no pallor/icterus,no post nasal drip Neck: No JVD, no thyromegaly, no carotid bruits Lungs: no use of accessory muscles, no dullness to percussion, clear without rales or rhonchi  Cardiovascular: Rhythm irregular, heart sounds  normal, no murmurs or gallops, no peripheral edema Musculoskeletal: No deformities, no cyanosis or clubbing        Assessment & Plan:   Assessment and Plan Assessment & Plan Obstructive sleep apnea Well-controlled with CPAP therapy. He reports dry mouth and air escaping through the mouth, likely due to improper mask fit or humidity settings. CPAP settings are effective with pressures ranging from 5 to 12 cm H2O, averaging 9 cm H2O, and maximum of 10 cm H2O. Compliance is excellent with more than 8 hours of use per night. CPAP has improved daytime somnolence and fatigue. - Adjust humidity settings to prevent dry mouth. - Continue current CPAP settings and compliance.  Bradycardia with occasional premature ventricular contractions Bradycardia with occasional PVCs. Heart rate ranges from 45 to 145 bpm. No significant arrhythmias detected on the three-day Holter monitor. Occasional skipped beats noted, but not concerning. Occasional dizziness reported, but not severe. Follow-up with cardiology is scheduled  for further evaluation. - Rechecked heart rate and documented findings. - Continue follow up with cardiology next week for further evaluation.  EKG - alternate PVCs, bigeminy, rate 65  K was 4.2 on 1/19 , d/w cardiology -will expedite his appt , gen benign  Advised to go to ED if symptomatic, dizzy , syncope or chest pain    "

## 2024-02-08 NOTE — Patient Instructions (Signed)
" °  VISIT SUMMARY: During your visit, we discussed your heart rate concerns and CPAP use for sleep apnea. We reviewed your heart monitor results and addressed your CPAP therapy settings.  YOUR PLAN: -OBSTRUCTIVE SLEEP APNEA: Obstructive sleep apnea is a condition where your breathing stops and starts during sleep due to blocked airways. Your CPAP therapy is working well, but you experience dry mouth and air leaks. Adjust the humidity settings to help with dry mouth and continue using your CPAP as you have been.  -BRADYCARDIA WITH OCCASIONAL PREMATURE VENTRICULAR CONTRACTIONS: Bradycardia is a slower than normal heart rate, and premature ventricular contractions are extra heartbeats. Your heart rate varies from 45 to 145 bpm, and while you have occasional skipped beats and dizziness, no significant arrhythmias were found. Continue to follow up with cardiology for further evaluation.  INSTRUCTIONS: Please follow up with cardiology next week for further evaluation of your heart rate and any related symptoms.    Contains text generated by [employer].   "

## 2024-02-08 NOTE — Addendum Note (Signed)
 Addended by: Laquilla Dault O on: 02/08/2024 04:57 PM   Modules accepted: Orders

## 2024-02-09 ENCOUNTER — Encounter: Payer: Self-pay | Admitting: Cardiology

## 2024-02-09 ENCOUNTER — Ambulatory Visit

## 2024-02-09 ENCOUNTER — Ambulatory Visit: Attending: Cardiology | Admitting: Cardiology

## 2024-02-09 VITALS — BP 110/80 | HR 60 | Ht 71.0 in | Wt 178.0 lb

## 2024-02-09 DIAGNOSIS — R079 Chest pain, unspecified: Secondary | ICD-10-CM | POA: Insufficient documentation

## 2024-02-09 DIAGNOSIS — G4733 Obstructive sleep apnea (adult) (pediatric): Secondary | ICD-10-CM | POA: Diagnosis present

## 2024-02-09 DIAGNOSIS — E782 Mixed hyperlipidemia: Secondary | ICD-10-CM | POA: Insufficient documentation

## 2024-02-09 DIAGNOSIS — I493 Ventricular premature depolarization: Secondary | ICD-10-CM | POA: Insufficient documentation

## 2024-02-09 DIAGNOSIS — J432 Centrilobular emphysema: Secondary | ICD-10-CM | POA: Diagnosis present

## 2024-02-09 DIAGNOSIS — R0609 Other forms of dyspnea: Secondary | ICD-10-CM | POA: Diagnosis present

## 2024-02-09 NOTE — Progress Notes (Signed)
 "  Cardiology Consultation:    Date:  02/09/2024   ID:  DASON MOSLEY, DOB November 22, 1955, MRN [id number]  PCP:  Perri Ronal PARAS, MD  Cardiologist:  Lamar Fitch, MD   Referring MD: Perri Ronal PARAS, MD   Chief Complaint  Patient presents with   Follow-up  I have extrasystole  History of Present Illness:    James [name] is a 69 y.o. male who is being seen today for the evaluation of extrasystole at the request of Baxley, Ronal PARAS, MD. past medical history significant for hyperlipidemia, obstructive sleep apnea he did see pulmonologist yesterday, EKG was done EKG showed ventricular bigeminy and morphology of QRS complexes indicate either RVOT or most likely LVOT PVCs.  He is aware of some heart skipping but no sustained arrhythmias, he did wear a monitor but only for 3 days described to have some dizzy spells but those dizzy spells last for long time during the time he wore monitor he did not have any dizziness.  Recently at the beginning of December he lost his wife and he is clearly grieving after that, he actually cries in my office likely he did get some counseling already which seems to be helping.  He is trying to be active trying to exercise on a regular basis gets short of breath quite easily at the same time he does do some push-ups and other exercises at home.  He does not smoke, quit smoking years ago, does have dyslipidemia but he takes Crestor  5 mg daily and last LDL I have is 84 from September last year.  He does have sleep apnea and gets 3-4 visits in the pulmonologist office.  He does have episode of syncope but long time ago.  Does have family history of premature coronary artery disease does not have any chest pain tightness squeezing pressure burning chest no swelling of lower extremities.  Past Medical History:  Diagnosis Date   Anemia    Aortic atherosclerosis    Barrett's esophagus    Cancer (HCC)    skin   Colitis    Degenerative disc disease    Diverticulosis 2013    Colonoscopy   GERD (gastroesophageal reflux disease)    Hiatal hernia    Hx of colonic polyps 2013   Colonoscopy- tubular adenoma, and hyperplastic    Hyperlipidemia    Under control with statins?   Internal hemorrhoids 2013   Colonoscopy   Lower back pain    Right inguinal hernia    Sleep apnea 2015   on CPAP   Tubular adenoma     Past Surgical History:  Procedure Laterality Date   COLONOSCOPY     HERNIA REPAIR  1983; 2021?   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   Procedure: RIGHT TOTAL HIP ARTHROPLASTY ANTERIOR APPROACH ;  Surgeon: Lonni CINDERELLA Poli, MD;  Location: [facility];  Service: Orthopedics;  Laterality: Right;   TOTAL HIP ARTHROPLASTY Left 04/30/2021   Procedure: LEFT TOTAL HIP ARTHROPLASTY ANTERIOR APPROACH;  Surgeon: Poli Lonni CINDERELLA, MD;  Location: [facility];  Service: Orthopedics;  Laterality: Left;   WISDOM TOOTH EXTRACTION      Current Medications: Active Medications[1]   Allergies:   Prednisone   Social History   Socioeconomic History   Marital status: Married    Spouse name: Not on file   Number of  children: 5   Years of education: Not on file   Highest education level: Some college, no degree  Occupational History   Occupation: Editor, Commissioning  Tobacco Use   Smoking status: Former    Current packs/day: 0.00    Average packs/day: 0.3 packs/day for 30.0 years (7.5 ttl pk-yrs)    Types: Cigarettes    Start date: 03/09/1984    Quit date: 03/09/2014    Years since quitting: 9.9   Smokeless tobacco: Never   Tobacco comments:    Counseling sheet given 08-2011  Vaping Use   Vaping status: Never Used  Substance and Sexual Activity   Alcohol use: Not Currently    Alcohol/week: 10.0 standard drinks of alcohol    Types: 10 Glasses of wine per week    Comment: week   Drug use: No   Sexual activity: Not on file  Other Topics Concern   Not  on file  Social History Narrative   Not on file   Social Drivers of Health   Tobacco Use: Medium Risk (02/09/2024)   Patient History    Smoking Tobacco Use: Former    Smokeless Tobacco Use: Never    Passive Exposure: Not on file  Financial Resource Strain: Low Risk (10/25/2023)   Overall Financial Resource Strain (CARDIA)    Difficulty of Paying Living Expenses: Not hard at all  Food Insecurity: No Food Insecurity (10/25/2023)   Epic    Worried About Programme Researcher, Broadcasting/film/video in the Last Year: Never true    Ran Out of Food in the Last Year: Never true  Transportation Needs: No Transportation Needs (10/25/2023)   Epic    Lack of Transportation (Medical): No    Lack of Transportation (Non-Medical): No  Physical Activity: Insufficiently Active (10/25/2023)   Exercise Vital Sign    Days of Exercise per Week: 3 days    Minutes of Exercise per Session: 30 min  Stress: No Stress Concern Present (10/25/2023)   [employer] of Occupational Health - Occupational Stress Questionnaire    Feeling of Stress: Only a little  Social Connections: Moderately Integrated (10/25/2023)   Social Connection and Isolation Panel    Frequency of Communication with Friends and Family: More than three times a week    Frequency of Social Gatherings with Friends and Family: Twice a week    Attends Religious Services: More than 4 times per year    Active Member of Clubs or Organizations: No    Attends Banker Meetings: Never    Marital Status: Married  Depression (PHQ2-9): Low Risk (10/25/2023)   Depression (PHQ2-9)    PHQ-2 Score: 0  Alcohol Screen: Low Risk (10/25/2023)   Alcohol Screen    Last Alcohol Screening Score (AUDIT): 1  Housing: Low Risk (10/25/2023)   Epic    Unable to Pay for Housing in the Last Year: No    Number of Times Moved in the Last Year: 0    Homeless in the Last Year: No  Utilities: Not At Risk (10/25/2023)   Epic    Threatened with loss of utilities: No  Health Literacy:  Adequate Health Literacy (10/25/2023)   B1300 Health Literacy    Frequency of need for help with medical instructions: Never     Family History: The patient's family history includes Colon cancer in his mother; Colon cancer (age of onset: 34) in his father; Esophageal cancer in his father; Heart disease in his father. There is no history of Stomach cancer, AAA (abdominal aortic  aneurysm), Rectal cancer, or Colon polyps. ROS:   Please see the history of present illness.    All 14 point review of systems negative except as described per history of present illness.  EKGs/Labs/Other Studies Reviewed:    The following studies were reviewed today:   EKG:   EKG showed normal sinus rhythm, ventricular bigeminy, morphology of PVCs indicate LVOT, incomplete right bundle branch block is baseline QRS complex with no ST segment changes.  Recent Labs: 02/05/2024: ALT 11; BUN 20; Creatinine, Ser 0.82; Hemoglobin 13.5; Platelets 261.0; Potassium 4.2; Sodium 138  Recent Lipid Panel    Component Value Date/Time   CHOL 165 10/10/2023 1102   TRIG 52 10/10/2023 1102   HDL 68 10/10/2023 1102   CHOLHDL 2.4 10/10/2023 1102   VLDL 18 08/04/2016 1049   LDLCALC 84 10/10/2023 1102    Physical Exam:    VS:  BP 110/80   Pulse 60   Ht 5' 11 (1.803 m)   Wt 178 lb (80.7 kg)   SpO2 97%   BMI 24.83 kg/m     Wt Readings from Last 3 Encounters:  02/09/24 178 lb (80.7 kg)  02/08/24 178 lb (80.7 kg)  02/05/24 179 lb 2 oz (81.3 kg)     GEN:  Well nourished, well developed in no acute distress HEENT: Normal NECK: No JVD; No carotid bruits LYMPHATICS: No lymphadenopathy CARDIAC: RRR, no murmurs, no rubs, no gallops RESPIRATORY:  Clear to auscultation without rales, wheezing or rhonchi  ABDOMEN: Soft, non-tender, non-distended MUSCULOSKELETAL:  No edema; No deformity  SKIN: Warm and dry NEUROLOGIC:  Alert and oriented x 3 PSYCHIATRIC:  Normal affect   ASSESSMENT:    1. Dyspnea on exertion   2.  Ventricular ectopy   3. Chest pain of uncertain etiology   4. PVC's (premature ventricular contractions)   5. OSA (obstructive sleep apnea)   6. Centrilobular emphysema (HCC)   7. Mixed hyperlipidemia    PLAN:    In order of problems listed above:  Frequent ventricular ectopy I do have 3 days monitor which is not sufficient, he did not have any symptoms during the time.  I will ask him to wear Zio patch for 2 weeks to see if there is any correlation between dizziness that he did not have while he wear it for 3 days and PVCs.  I will not initiate any therapy until I will have better diagnosis, part of evaluation and try to assess significance of this extrasystole will be to do echocardiogram to assess left ventricle ejection fraction.  He needs to have a very traumatic event he is wife dying of more about potential Takotsubo, however, he does not demonstrate any signs or symptoms of congestive heart failure.  Also part of the evaluation that we need to evaluate him for potential coronary disease.  He does not have any symptoms that would suggest that but with his PVCs I will schedule him to have a stress test that allowed us  to look at the behavior of PVCs as well as follow-up potential ischemia.  In the future anticipate needs to use probably beta-blocker or maybe calcium  channel blocker to suppress this arrhythmia. Obstructive sleep apnea managed excellently by pulmonary team. Dyslipidemia on statin which I will continue   Medication Adjustments/Labs and Tests Ordered: Current medicines are reviewed at length with the patient today.  Concerns regarding medicines are outlined above.  Orders Placed This Encounter  Procedures   LONG TERM MONITOR (3-14 DAYS)   Exercise Tolerance  Test   ECHOCARDIOGRAM COMPLETE   No orders of the defined types were placed in this encounter.   Signed, Lamar DOROTHA Fitch, MD, [facility]. 02/09/2024 12:17 PM    [facility]    [1]  Current  Meds  Medication Sig   ALPRAZolam  (XANAX ) 0.25 MG tablet TAKE 1 TABLET(0.25 MG) BY MOUTH TWICE DAILY AS NEEDED FOR ANXIETY   cholecalciferol  (VITAMIN D3) 25 MCG (1000 UNIT) tablet Take 1,000 Units by mouth daily.   ibuprofen  (ADVIL ) 800 MG tablet TAKE 1 TABLET(800 MG) BY MOUTH EVERY 8 HOURS AS NEEDED   mesalamine  (CANASA ) 1000 MG suppository UNWRAP AND INSERT 1 SUPPOSITORY(1000 MG) RECTALLY AT BEDTIME   mesalamine  (LIALDA ) 1.2 g EC tablet TAKE 4 TABLETS(4.8 GRAMS) BY MOUTH DAILY WITH BREAKFAST   Multiple Vitamin (MULTIVITAMIN) tablet Take 1 tablet by mouth daily.   oxymetazoline (AFRIN) 0.05 % nasal spray Place 4 sprays into both nostrils at bedtime.   pantoprazole  (PROTONIX ) 40 MG tablet TAKE 1 TABLET BY MOUTH EVERY DAY   rosuvastatin  (CRESTOR ) 5 MG tablet TAKE 1 TABLET(5 MG) BY MOUTH DAILY   "

## 2024-02-09 NOTE — Patient Instructions (Signed)
 Medication Instructions:  Your physician recommends that you continue on your current medications as directed. Please refer to the Current Medication list given to you today.  *If you need a refill on your cardiac medications before your next appointment, please call your pharmacy*   Lab Work: None Ordered If you have labs (blood work) drawn today and your tests are completely normal, you will receive your results only by: MyChart Message (if you have MyChart) OR A paper copy in the mail If you have any lab test that is abnormal or we need to change your treatment, we will call you to review the results.   Testing/Procedures:  WHY IS MY DOCTOR PRESCRIBING ZIO? The Zio system is proven and trusted by physicians to detect and diagnose irregular heart rhythms -- and has been prescribed to hundreds of thousands of patients.  The FDA has cleared the Zio system to monitor for many different kinds of irregular heart rhythms. In a study, physicians were able to reach a diagnosis 90% of the time with the Zio system1.  You can wear the Zio monitor -- a small, discreet, comfortable patch -- during your normal day-to-day activity, including while you sleep, shower, and exercise, while it records every single heartbeat for analysis.  1Barrett, P., et al. Comparison of 24 Hour Holter Monitoring Versus 14 Day Novel Adhesive Patch Electrocardiographic Monitoring. American Journal of Medicine, 2014.  ZIO VS. HOLTER MONITORING The Zio monitor can be comfortably worn for up to 14 days. Holter monitors can be worn for 24 to 48 hours, limiting the time to record any irregular heart rhythms you may have. Zio is able to capture data for the 51% of patients who have their first symptom-triggered arrhythmia after 48 hours.1  LIVE WITHOUT RESTRICTIONS The Zio ambulatory cardiac monitor is a small, unobtrusive, and water -resistant patch--you might even forget youre wearing it. The Zio monitor records and stores  every beat of your heart, whether you're sleeping, working out, or showering.   Your physician has requested that you have an echocardiogram. Echocardiography is a painless test that uses sound waves to create images of your heart. It provides your doctor with information about the size and shape of your heart and how well your hearts chambers and valves are working. This procedure takes approximately one hour. There are no restrictions for this procedure. Please do NOT wear cologne, perfume, aftershave, or lotions (deodorant is allowed). Please arrive 15 minutes prior to your appointment time.  Please note: We ask at that you not bring children with you during ultrasound (echo/ vascular) testing. Due to room size and safety concerns, children are not allowed in the ultrasound rooms during exams. Our front office staff cannot provide observation of children in our lobby area while testing is being conducted. An adult accompanying a patient to their appointment will only be allowed in the ultrasound room at the discretion of the ultrasound technician under special circumstances. We apologize for any inconvenience.    Exercise Stress test:   Eat a light Breakfast   Dress to Exercise   Follow-Up: At [facility], you and your health needs are our priority.  As part of our continuing mission to provide you with exceptional heart care, we have created designated Provider Care Teams.  These Care Teams include your primary Cardiologist (physician) and Advanced Practice Providers (APPs -  Physician Assistants and Nurse Practitioners) who all work together to provide you with the care you need, when you need it.  We recommend signing  up for the patient portal called MyChart.  Sign up information is provided on this After Visit Summary.  MyChart is used to connect with patients for Virtual Visits (Telemedicine).  Patients are able to view lab/test results, encounter notes, upcoming appointments, etc.   Non-urgent messages can be sent to your provider as well.   To learn more about what you can do with MyChart, go to [email or web address].    Your next appointment:   6 week(s)  The format for your next appointment:   In Person  Provider:   Lamar Fitch, MD    Other Instructions NA

## 2024-02-12 ENCOUNTER — Other Ambulatory Visit (HOSPITAL_BASED_OUTPATIENT_CLINIC_OR_DEPARTMENT_OTHER): Admitting: Radiology

## 2024-02-15 ENCOUNTER — Encounter: Payer: Self-pay | Admitting: Internal Medicine

## 2024-02-16 ENCOUNTER — Ambulatory Visit: Admitting: Cardiology

## 2024-02-20 ENCOUNTER — Ambulatory Visit (HOSPITAL_BASED_OUTPATIENT_CLINIC_OR_DEPARTMENT_OTHER)
Admission: RE | Admit: 2024-02-20 | Discharge: 2024-02-20 | Disposition: A | Source: Ambulatory Visit | Attending: Nurse Practitioner | Admitting: Radiology

## 2024-02-20 DIAGNOSIS — R634 Abnormal weight loss: Secondary | ICD-10-CM

## 2024-02-20 DIAGNOSIS — R1032 Left lower quadrant pain: Secondary | ICD-10-CM

## 2024-02-20 MED ORDER — IOHEXOL 300 MG/ML  SOLN
100.0000 mL | Freq: Once | INTRAMUSCULAR | Status: AC | PRN
  Administered 2024-02-20: 100 mL via INTRAVENOUS

## 2024-02-21 ENCOUNTER — Telehealth (HOSPITAL_COMMUNITY): Payer: Self-pay | Admitting: *Deleted

## 2024-02-21 NOTE — Telephone Encounter (Signed)
 Left detailed instructions on voicemail for ETT scheduled on 02/29/24.

## 2024-02-29 ENCOUNTER — Ambulatory Visit (HOSPITAL_COMMUNITY): Admission: RE | Admit: 2024-02-29 | Attending: Cardiology | Admitting: Cardiology

## 2024-03-12 ENCOUNTER — Ambulatory Visit

## 2024-03-22 ENCOUNTER — Ambulatory Visit: Admitting: Cardiology
